# Patient Record
Sex: Female | Born: 1975
Health system: Southern US, Community
[De-identification: ages and names within clinical notes are randomized; demographics above are authoritative.]

## PROBLEM LIST (undated history)

## (undated) DIAGNOSIS — M549 Dorsalgia, unspecified: Secondary | ICD-10-CM

## (undated) DIAGNOSIS — M2550 Pain in unspecified joint: Secondary | ICD-10-CM

## (undated) DIAGNOSIS — K219 Gastro-esophageal reflux disease without esophagitis: Secondary | ICD-10-CM

## (undated) DIAGNOSIS — M1990 Unspecified osteoarthritis, unspecified site: Secondary | ICD-10-CM

## (undated) DIAGNOSIS — M722 Plantar fascial fibromatosis: Secondary | ICD-10-CM

## (undated) DIAGNOSIS — F32A Depression, unspecified: Secondary | ICD-10-CM

## (undated) DIAGNOSIS — T7840XA Allergy, unspecified, initial encounter: Secondary | ICD-10-CM

## (undated) DIAGNOSIS — K8020 Calculus of gallbladder without cholecystitis without obstruction: Secondary | ICD-10-CM

## (undated) DIAGNOSIS — O24419 Gestational diabetes mellitus in pregnancy, unspecified control: Secondary | ICD-10-CM

## (undated) DIAGNOSIS — Z8619 Personal history of other infectious and parasitic diseases: Secondary | ICD-10-CM

## (undated) DIAGNOSIS — E282 Polycystic ovarian syndrome: Secondary | ICD-10-CM

## (undated) DIAGNOSIS — O139 Gestational [pregnancy-induced] hypertension without significant proteinuria, unspecified trimester: Secondary | ICD-10-CM

## (undated) DIAGNOSIS — F419 Anxiety disorder, unspecified: Secondary | ICD-10-CM

## (undated) HISTORY — DX: Pain in unspecified joint: M25.50

## (undated) HISTORY — DX: Allergy, unspecified, initial encounter: T78.40XA

## (undated) HISTORY — DX: Plantar fascial fibromatosis: M72.2

## (undated) HISTORY — DX: Personal history of other infectious and parasitic diseases: Z86.19

## (undated) HISTORY — DX: Gastro-esophageal reflux disease without esophagitis: K21.9

## (undated) HISTORY — DX: Anxiety disorder, unspecified: F41.9

## (undated) HISTORY — PX: WISDOM TOOTH EXTRACTION: SHX21

## (undated) HISTORY — DX: Dorsalgia, unspecified: M54.9

## (undated) HISTORY — DX: Gestational diabetes mellitus in pregnancy, unspecified control: O24.419

## (undated) HISTORY — DX: Unspecified osteoarthritis, unspecified site: M19.90

## (undated) HISTORY — DX: Depression, unspecified: F32.A

## (undated) HISTORY — DX: Polycystic ovarian syndrome: E28.2

---

## 2001-08-27 ENCOUNTER — Other Ambulatory Visit: Admission: RE | Admit: 2001-08-27 | Discharge: 2001-08-27 | Payer: Self-pay | Admitting: Obstetrics and Gynecology

## 2002-08-06 ENCOUNTER — Other Ambulatory Visit: Admission: RE | Admit: 2002-08-06 | Discharge: 2002-08-06 | Payer: Self-pay | Admitting: Obstetrics and Gynecology

## 2003-08-24 ENCOUNTER — Other Ambulatory Visit: Admission: RE | Admit: 2003-08-24 | Discharge: 2003-08-24 | Payer: Self-pay | Admitting: Obstetrics and Gynecology

## 2004-08-29 ENCOUNTER — Other Ambulatory Visit: Admission: RE | Admit: 2004-08-29 | Discharge: 2004-08-29 | Payer: Self-pay | Admitting: Obstetrics and Gynecology

## 2005-09-27 ENCOUNTER — Other Ambulatory Visit: Admission: RE | Admit: 2005-09-27 | Discharge: 2005-09-27 | Payer: Self-pay | Admitting: Obstetrics and Gynecology

## 2006-12-03 ENCOUNTER — Other Ambulatory Visit: Admission: RE | Admit: 2006-12-03 | Discharge: 2006-12-03 | Payer: Self-pay | Admitting: Obstetrics and Gynecology

## 2007-03-09 ENCOUNTER — Ambulatory Visit: Payer: Self-pay | Admitting: Internal Medicine

## 2007-03-10 DIAGNOSIS — J4530 Mild persistent asthma, uncomplicated: Secondary | ICD-10-CM | POA: Insufficient documentation

## 2007-03-10 DIAGNOSIS — Z9189 Other specified personal risk factors, not elsewhere classified: Secondary | ICD-10-CM | POA: Insufficient documentation

## 2007-03-24 ENCOUNTER — Encounter: Payer: Self-pay | Admitting: Internal Medicine

## 2007-03-24 ENCOUNTER — Ambulatory Visit: Payer: Self-pay | Admitting: Internal Medicine

## 2007-03-24 DIAGNOSIS — L2089 Other atopic dermatitis: Secondary | ICD-10-CM

## 2007-04-20 ENCOUNTER — Encounter: Payer: Self-pay | Admitting: Internal Medicine

## 2007-06-15 ENCOUNTER — Encounter: Payer: Self-pay | Admitting: Internal Medicine

## 2007-12-18 ENCOUNTER — Encounter: Payer: Self-pay | Admitting: Internal Medicine

## 2008-01-13 ENCOUNTER — Other Ambulatory Visit: Admission: RE | Admit: 2008-01-13 | Discharge: 2008-01-13 | Payer: Self-pay | Admitting: Obstetrics and Gynecology

## 2008-01-25 ENCOUNTER — Encounter (INDEPENDENT_AMBULATORY_CARE_PROVIDER_SITE_OTHER): Payer: Self-pay | Admitting: *Deleted

## 2008-01-25 ENCOUNTER — Ambulatory Visit: Payer: Self-pay | Admitting: Internal Medicine

## 2008-01-27 LAB — CONVERTED CEMR LAB
Mumps IgG: 3.79 — ABNORMAL HIGH
Rubella: 77.9 intl units/mL — ABNORMAL HIGH
Rubeola IgG: 2.35 — ABNORMAL HIGH

## 2010-10-28 DIAGNOSIS — E282 Polycystic ovarian syndrome: Secondary | ICD-10-CM

## 2010-10-28 HISTORY — DX: Polycystic ovarian syndrome: E28.2

## 2011-12-02 ENCOUNTER — Ambulatory Visit (INDEPENDENT_AMBULATORY_CARE_PROVIDER_SITE_OTHER): Payer: 59 | Admitting: Physician Assistant

## 2011-12-02 VITALS — BP 136/84 | HR 58 | Temp 97.5°F | Resp 14 | Ht 64.0 in | Wt 224.0 lb

## 2011-12-02 DIAGNOSIS — R062 Wheezing: Secondary | ICD-10-CM

## 2011-12-02 DIAGNOSIS — R05 Cough: Secondary | ICD-10-CM

## 2011-12-02 DIAGNOSIS — R059 Cough, unspecified: Secondary | ICD-10-CM

## 2011-12-02 DIAGNOSIS — J310 Chronic rhinitis: Secondary | ICD-10-CM

## 2011-12-02 DIAGNOSIS — R0602 Shortness of breath: Secondary | ICD-10-CM

## 2011-12-02 MED ORDER — BUDESONIDE-FORMOTEROL FUMARATE 160-4.5 MCG/ACT IN AERO: 2.0000 | INHALATION_SPRAY | Freq: Two times a day (BID) | RESPIRATORY_TRACT | Status: DC

## 2011-12-02 MED ORDER — IPRATROPIUM BROMIDE 0.03 % NA SOLN: 2.0000 | Freq: Two times a day (BID) | NASAL | Status: DC

## 2011-12-02 MED ORDER — ALBUTEROL SULFATE HFA 108 (90 BASE) MCG/ACT IN AERS: 2.0000 | INHALATION_SPRAY | RESPIRATORY_TRACT | Status: DC | PRN

## 2011-12-02 NOTE — Progress Notes (Signed)
  Subjective:    Patient ID: Stephanie Long, female    DOB: 1976/08/06, 35 y.o.   MRN: [id number]  HPI Patient presents with several days of mild nasal congestion but wheezing cough and shortness of breath consistent with asthma. She has not used her Symbicort or albuterol rescue inhaler in several months. Her symptoms have been well-controlled until recently. She's been using her albuterol rescue inhaler with temporary relief of her symptoms but has now run out. She has not resumed use of the Symbicort north Zyrtec.  No fevers or chills. No GI or GU symptoms. No myalgias. No ear pressure or fullness. No facial pain or pressure. Cough is nonproductive.   Review of Systems  Constitutional: Negative for chills and fatigue.  HENT: Positive for congestion, rhinorrhea and postnasal drip. Negative for hearing loss, ear pain, sneezing, neck pain, tinnitus and ear discharge.   Eyes: Negative for visual disturbance.  Respiratory: Positive for cough, shortness of breath and wheezing. Negative for chest tightness.   Cardiovascular: Negative for chest pain and palpitations.  Genitourinary: Negative for dysuria, urgency and frequency.  Musculoskeletal: Negative for myalgias, back pain and arthralgias.  Skin: Negative for rash.  Neurological: Negative for dizziness and headaches.  Hematological: Negative for adenopathy.       Objective:   Physical Exam  Vitals reviewed. Constitutional: She is oriented to person, place, and time. Vital signs are normal. She appears well-developed and well-nourished.  HENT:  Head: Normocephalic and atraumatic.  Right Ear: Hearing, tympanic membrane and external ear normal.  Left Ear: Hearing, tympanic membrane, external ear and ear canal normal.  Nose: Nose normal.  Mouth/Throat: Oropharynx is clear and moist and mucous membranes are normal. No oropharyngeal exudate.  Eyes: Pupils are equal, round, and reactive to light.  Neck: Normal range of motion. Neck  supple. No thyromegaly present.  Cardiovascular: Normal rate, regular rhythm, normal heart sounds and intact distal pulses.   Pulmonary/Chest: Effort normal. No accessory muscle usage. Not tachypneic. No respiratory distress. She has wheezes in the right lower field and the left lower field. She has no rhonchi. She has no rales.  Lymphadenopathy:    She has no cervical adenopathy.  Neurological: She is alert and oriented to person, place, and time.  Skin: Skin is warm and dry.          Assessment & Plan:  Asthma exacerbation.  Restart Zyrtec and Symbicort. Continue albuterol for rescue as needed. Add Atrovent nasal spray. If symptoms are not improving in the next 2-3 days return for reevaluation.

## 2011-12-02 NOTE — Patient Instructions (Signed)
Restart the Zyrtec and Symbicort.  Use the albuterol as needed.  When your symptoms improve, you may stop the Zyrtec, then the Symbicort and the ipratropium nasal spray. If your symptoms are not improved in the next 48 hours, or if they worsen, return for re-evaluation.

## 2012-01-08 ENCOUNTER — Ambulatory Visit (INDEPENDENT_AMBULATORY_CARE_PROVIDER_SITE_OTHER): Payer: 59 | Admitting: Internal Medicine

## 2012-01-08 ENCOUNTER — Encounter: Payer: Self-pay | Admitting: Internal Medicine

## 2012-01-08 VITALS — BP 120/72 | HR 60 | Temp 98.4°F | Resp 16 | Ht 64.0 in | Wt 230.0 lb

## 2012-01-08 DIAGNOSIS — J4530 Mild persistent asthma, uncomplicated: Secondary | ICD-10-CM

## 2012-01-08 DIAGNOSIS — R05 Cough: Secondary | ICD-10-CM

## 2012-01-08 DIAGNOSIS — R0602 Shortness of breath: Secondary | ICD-10-CM

## 2012-01-08 DIAGNOSIS — J45909 Unspecified asthma, uncomplicated: Secondary | ICD-10-CM

## 2012-01-08 DIAGNOSIS — R062 Wheezing: Secondary | ICD-10-CM

## 2012-01-08 DIAGNOSIS — R059 Cough, unspecified: Secondary | ICD-10-CM

## 2012-01-08 MED ORDER — BUDESONIDE-FORMOTEROL FUMARATE 160-4.5 MCG/ACT IN AERO: 2.0000 | INHALATION_SPRAY | Freq: Two times a day (BID) | RESPIRATORY_TRACT | Status: DC

## 2012-01-08 MED ORDER — ALBUTEROL SULFATE HFA 108 (90 BASE) MCG/ACT IN AERS: 2.0000 | INHALATION_SPRAY | RESPIRATORY_TRACT | Status: DC | PRN

## 2012-01-08 NOTE — Patient Instructions (Signed)

## 2012-01-08 NOTE — Assessment & Plan Note (Signed)
Continue current regimen

## 2012-01-08 NOTE — Progress Notes (Signed)
  Subjective:    Patient ID: TENNILLE MONTELONGO, female    DOB: Jan 11, 1976, 36 y.o.   MRN: [id number]  Asthma She complains of wheezing. There is no chest tightness, cough, difficulty breathing, frequent throat clearing, hemoptysis, hoarse voice, shortness of breath or sputum production. This is a chronic problem. The current episode started more than 1 year ago. The problem occurs intermittently. The problem has been gradually improving. Pertinent negatives include no appetite change, chest pain, dyspnea on exertion, ear congestion, ear pain, fever, headaches, heartburn, malaise/fatigue, myalgias, nasal congestion, orthopnea, PND, postnasal drip, rhinorrhea, sneezing, sore throat, sweats, trouble swallowing or weight loss. Her symptoms are aggravated by change in weather. Her symptoms are alleviated by steroid inhaler and beta-agonist. She reports significant improvement on treatment. Her past medical history is significant for asthma.      Review of Systems  Constitutional: Negative.  Negative for fever, weight loss, malaise/fatigue and appetite change.  HENT: Negative.  Negative for ear pain, sore throat, hoarse voice, rhinorrhea, sneezing, trouble swallowing and postnasal drip.   Eyes: Negative.   Respiratory: Positive for wheezing. Negative for apnea, cough, hemoptysis, sputum production, choking, chest tightness, shortness of breath and stridor.   Cardiovascular: Negative for chest pain, dyspnea on exertion, palpitations, leg swelling and PND.  Gastrointestinal: Negative.  Negative for heartburn.  Genitourinary: Negative.   Musculoskeletal: Negative.  Negative for myalgias.  Skin: Negative.   Neurological: Negative.  Negative for headaches.  Hematological: Negative.   Psychiatric/Behavioral: Negative.        Objective:   Physical Exam  Vitals reviewed. Constitutional: She is oriented to person, place, and time. She appears well-developed and well-nourished. No distress.  HENT:    Head: Normocephalic and atraumatic.  Mouth/Throat: Oropharynx is clear and moist. No oropharyngeal exudate.  Eyes: Conjunctivae are normal. Right eye exhibits no discharge. Left eye exhibits no discharge. No scleral icterus.  Neck: Normal range of motion. Neck supple. No JVD present. No tracheal deviation present. No thyromegaly present.  Cardiovascular: Normal rate, regular rhythm, normal heart sounds and intact distal pulses.  Exam reveals no gallop and no friction rub.   No murmur heard. Pulmonary/Chest: Effort normal and breath sounds normal. No accessory muscle usage or stridor. Not tachypneic. No respiratory distress. She has no decreased breath sounds. She has no wheezes. She has no rhonchi. She has no rales. She exhibits no tenderness.  Abdominal: Soft. Bowel sounds are normal. She exhibits no distension and no mass. There is no tenderness. There is no rebound and no guarding.  Musculoskeletal: Normal range of motion. She exhibits no edema and no tenderness.  Lymphadenopathy:    She has no cervical adenopathy.  Neurological: She is oriented to person, place, and time.  Skin: Skin is warm and dry. No rash noted. She is not diaphoretic. No erythema. No pallor.  Psychiatric: She has a normal mood and affect. Her behavior is normal. Judgment and thought content normal.          Assessment & Plan:

## 2012-10-06 LAB — OB RESULTS CONSOLE ABO/RH

## 2012-10-06 LAB — OB RESULTS CONSOLE ANTIBODY SCREEN: Antibody Screen: NEGATIVE

## 2013-03-10 ENCOUNTER — Encounter: Payer: 59 | Attending: Obstetrics and Gynecology | Admitting: *Deleted

## 2013-03-10 ENCOUNTER — Encounter: Payer: Self-pay | Admitting: *Deleted

## 2013-03-10 DIAGNOSIS — Z713 Dietary counseling and surveillance: Secondary | ICD-10-CM | POA: Insufficient documentation

## 2013-03-10 DIAGNOSIS — O99810 Abnormal glucose complicating pregnancy: Secondary | ICD-10-CM | POA: Insufficient documentation

## 2013-03-10 NOTE — Patient Instructions (Signed)
Goals:  Check glucose levels per MD as instructed  Follow Gestational Diabetes Diet as instructed  Call for follow-up as needed    

## 2013-03-10 NOTE — Progress Notes (Signed)
  Patient was seen on 03/10/2013 for Gestational Diabetes self-management class at the [facility]. The following learning objectives were met by the patient during this course:   States the definition of Gestational Diabetes  States why dietary management is important in controlling blood glucose  Describes the effects each nutrient has on blood glucose levels  Demonstrates ability to create a balanced meal plan  Demonstrates carbohydrate counting   States when to check blood glucose levels  Demonstrates proper blood glucose monitoring techniques  States the effect of stress and exercise on blood glucose levels  States the importance of limiting caffeine and abstaining from alcohol and smoking  Blood glucose monitor given: Blood glucose monitor given: Contour Next EZ Blood Glucose Kit Lot # [id number] Exp: 09/2013 Blood glucose reading: 122 mg/dl  Patient instructed to monitor glucose levels: FBS: 60 - <90 2 hour: <120  *Patient received handouts:  Nutrition Diabetes and Pregnancy  Carbohydrate Counting List  Patient will be seen for follow-up as needed.

## 2013-04-13 LAB — OB RESULTS CONSOLE GBS: GBS: NEGATIVE

## 2013-05-05 ENCOUNTER — Encounter (HOSPITAL_COMMUNITY): Payer: Self-pay | Admitting: *Deleted

## 2013-05-05 ENCOUNTER — Other Ambulatory Visit: Payer: Self-pay | Admitting: Obstetrics and Gynecology

## 2013-05-05 ENCOUNTER — Telehealth (HOSPITAL_COMMUNITY): Payer: Self-pay | Admitting: *Deleted

## 2013-05-05 NOTE — Telephone Encounter (Signed)
Preadmission screen  

## 2013-05-08 ENCOUNTER — Inpatient Hospital Stay (HOSPITAL_COMMUNITY)
Admission: AD | Admit: 2013-05-08 | Discharge: 2013-05-08 | Disposition: A | Discharge status: Home / Self Care | Payer: 59 | Source: Ambulatory Visit | Attending: Obstetrics and Gynecology | Admitting: Obstetrics and Gynecology

## 2013-05-08 ENCOUNTER — Encounter (HOSPITAL_COMMUNITY): Payer: Self-pay

## 2013-05-08 DIAGNOSIS — O99810 Abnormal glucose complicating pregnancy: Secondary | ICD-10-CM | POA: Insufficient documentation

## 2013-05-08 DIAGNOSIS — O479 False labor, unspecified: Secondary | ICD-10-CM | POA: Insufficient documentation

## 2013-05-08 DIAGNOSIS — R030 Elevated blood-pressure reading, without diagnosis of hypertension: Secondary | ICD-10-CM | POA: Insufficient documentation

## 2013-05-08 DIAGNOSIS — O471 False labor at or after 37 completed weeks of gestation: Secondary | ICD-10-CM | POA: Diagnosis present

## 2013-05-08 DIAGNOSIS — O99891 Other specified diseases and conditions complicating pregnancy: Secondary | ICD-10-CM | POA: Insufficient documentation

## 2013-05-08 LAB — COMPREHENSIVE METABOLIC PANEL
Alkaline Phosphatase: 159 U/L — ABNORMAL HIGH (ref 39–117)
BUN: 10 mg/dL (ref 6–23)
CO2: 21 mEq/L (ref 19–32)
Chloride: 99 mEq/L (ref 96–112)
GFR calc Af Amer: >90 mL/min (ref >90)
Glucose, Bld: 102 mg/dL — ABNORMAL HIGH (ref 70–99)
Potassium: 3.7 mEq/L (ref 3.5–5.1)
Total Bilirubin: 0.3 mg/dL (ref 0.3–1.2)

## 2013-05-08 LAB — URIC ACID: Uric Acid, Serum: 7.1 mg/dL — ABNORMAL HIGH (ref 2.4–7.0)

## 2013-05-08 LAB — URINALYSIS, ROUTINE W REFLEX MICROSCOPIC
Bilirubin Urine: NEGATIVE
Ketones, ur: NEGATIVE mg/dL
Nitrite: NEGATIVE
Urobilinogen, UA: 0.2 mg/dL (ref 0.0–1.0)

## 2013-05-08 LAB — URINE MICROSCOPIC-ADD ON

## 2013-05-08 LAB — CBC
HCT: 36.2 % (ref 36.0–46.0)
Hemoglobin: 12.6 g/dL (ref 12.0–15.0)
WBC: 17 10*3/uL — ABNORMAL HIGH (ref 4.0–10.5)

## 2013-05-08 MED ORDER — HYDROXYZINE HCL 50 MG PO TABS
50.0000 mg | ORAL_TABLET | Freq: Once | ORAL | Status: AC
  Administered 2013-05-08: 50 mg via ORAL
  Filled 2013-05-08: qty 1

## 2013-05-08 MED ORDER — NALBUPHINE HCL 10 MG/ML IJ SOLN
10.0000 mg | Freq: Once | INTRAMUSCULAR | Status: AC
  Administered 2013-05-08: 10 mg via SUBCUTANEOUS
  Filled 2013-05-08: qty 1

## 2013-05-08 NOTE — MAU Note (Signed)
Pt states she has been having uc for the past 36 hours, but have gotten progressively stronger and closer together within the past hour. Denies LOF or vaginal bleeding. States good FM.

## 2013-05-08 NOTE — MAU Provider Note (Signed)
History     CSN: [id number]  Arrival date and time: 05/08/13 1943  Provider here at 1945    Chief Complaint  Patient presents with  . Contractions   HPI  G1P0, 40.2wks presents with contractions every 5-10 mins for 36+ hrs.  No ROM or vaginal bleeding.  (+) FM.    Past Medical History  Diagnosis Date  . Asthma   . Allergy   . GERD (gastroesophageal reflux disease)   . PCOS (polycystic ovarian syndrome) 2012  . Diabetes mellitus without complication   . Gestational diabetes   . Hx of varicella     Past Surgical History  Procedure Laterality Date  . Wisdom tooth extraction    . Wisdom tooth extraction      Family History  Problem Relation Age of Onset  . Diabetes Father   . Heart disease Father   . Hyperlipidemia Father   . Heart attack Father   . Cancer Neg Hx   . Asthma Neg Hx   . Early death Neg Hx   . Depression Mother   . Stroke Maternal Grandmother     History  Substance Use Topics  . Smoking status: Never Smoker   . Smokeless tobacco: Never Used  . Alcohol Use: 1.1 oz/week    1 Glasses of wine, 1 Drinks containing 0.5 oz of alcohol per week    Allergies: No Known Allergies  Prescriptions prior to admission  Medication Sig Dispense Refill  . acetaminophen (TYLENOL) 500 MG tablet Take 500 mg by mouth every 6 (six) hours as needed.      . calcium carbonate (TUMS - DOSED IN MG ELEMENTAL CALCIUM) 500 MG chewable tablet Chew 1 tablet by mouth daily.      . cetirizine (ZYRTEC) 10 MG tablet Take 10 mg by mouth daily.      Marland Kitchen PRENATAL VITAMINS PO Take by mouth.      Marland Kitchen albuterol (PROVENTIL HFA;VENTOLIN HFA) 108 (90 BASE) MCG/ACT inhaler Inhale 2 puffs into the lungs every 4 (four) hours as needed.  1 Inhaler  11  . budesonide-formoterol (SYMBICORT) 160-4.5 MCG/ACT inhaler Inhale 2 puffs into the lungs 2 (two) times daily.  1 Inhaler  11  . clomiPHENE (CLOMID) 50 MG tablet Take 50 mg by mouth daily.      Marland Kitchen ipratropium (ATROVENT) 0.03 % nasal spray Place 2  sprays into the nose 2 (two) times daily.  30 mL  5    Review of Systems  Constitutional: Negative.   HENT: Negative.   Eyes: Negative.  Negative for blurred vision and double vision.  Respiratory: Negative.   Cardiovascular: Negative.   Gastrointestinal: Positive for diarrhea. Negative for abdominal pain.       Diarrhea x1 episode this AM  Genitourinary: Negative for dysuria.  Musculoskeletal: Negative.   Skin: Negative.   Neurological: Negative for dizziness and headaches.  Endo/Heme/Allergies: Negative.   Psychiatric/Behavioral: Negative.   Seen in office yesterday by Dr. Juliene Pina - membranes stripped  Physical Exam  BP: 156/92, 141/88, 160/91, 145/91 Blood pressure 141/88, pulse 76, temperature 97.7 F (36.5 C), temperature source Oral, resp. rate 18, height 5\' 4"  (1.626 m), weight 116.484 kg (256 lb 12.8 oz), last menstrual period 07/24/2012, SpO2 100.00%.  Physical Exam General: A&O x3, no apparent distress, calm, ambulatory, breathing with contractions Abd: gravid, nontender Vaginal Exam: 1/60%; soft/-3 Extremities: mild pedal edema Results for orders placed during the hospital encounter of 05/08/13 (from the past 24 hour(s))  CBC     Status:  Abnormal   Collection Time    05/08/13  8:27 PM      Result Value Range   WBC 17.0 (*) 4.0 - 10.5 K/uL   RBC 4.18  3.87 - 5.11 MIL/uL   Hemoglobin 12.6  12.0 - 15.0 g/dL   HCT 47.8  29.5 - 62.1 %   MCV 86.6  78.0 - 100.0 fL   MCH 30.1  26.0 - 34.0 pg   MCHC 34.8  30.0 - 36.0 g/dL   RDW 30.8  65.7 - 84.6 %   Platelets 212  150 - 400 K/uL  COMPREHENSIVE METABOLIC PANEL     Status: Abnormal   Collection Time    05/08/13  8:27 PM      Result Value Range   Sodium 133 (*) 135 - 145 mEq/L   Potassium 3.7  3.5 - 5.1 mEq/L   Chloride 99  96 - 112 mEq/L   CO2 21  19 - 32 mEq/L   Glucose, Bld 102 (*) 70 - 99 mg/dL   BUN 10  6 - 23 mg/dL   Creatinine, Ser 9.62  0.50 - 1.10 mg/dL   Calcium 9.1  8.4 - 95.2 mg/dL   Total Protein 6.5   6.0 - 8.3 g/dL   Albumin 2.7 (*) 3.5 - 5.2 g/dL   AST 18  0 - 37 U/L   ALT 8  0 - 35 U/L   Alkaline Phosphatase 159 (*) 39 - 117 U/L   Total Bilirubin 0.3  0.3 - 1.2 mg/dL   GFR calc non Af Amer >90  >90 mL/min   GFR calc Af Amer >90  >90 mL/min  URIC ACID     Status: Abnormal   Collection Time    05/08/13  8:27 PM      Result Value Range   Uric Acid, Serum 7.1 (*) 2.4 - 7.0 mg/dL  URINALYSIS, ROUTINE W REFLEX MICROSCOPIC     Status: Abnormal   Collection Time    05/08/13  8:45 PM      Result Value Range   Color, Urine YELLOW  YELLOW   APPearance CLEAR  CLEAR   Specific Gravity, Urine 1.020  1.005 - 1.030   pH 6.0  5.0 - 8.0   Glucose, UA NEGATIVE  NEGATIVE mg/dL   Hgb urine dipstick LARGE (*) NEGATIVE   Bilirubin Urine NEGATIVE  NEGATIVE   Ketones, ur NEGATIVE  NEGATIVE mg/dL   Protein, ur NEGATIVE  NEGATIVE mg/dL   Urobilinogen, UA 0.2  0.0 - 1.0 mg/dL   Nitrite NEGATIVE  NEGATIVE   Leukocytes, UA LARGE (*) NEGATIVE  URINE MICROSCOPIC-ADD ON     Status: Abnormal   Collection Time    05/08/13  8:45 PM      Result Value Range   Squamous Epithelial / LPF MANY (*) RARE   WBC, UA TOO NUMEROUS TO COUNT  <3 WBC/hpf   RBC / HPF 11-20  <3 RBC/hpf   Bacteria, UA FEW (*) RARE   Urine-Other MUCOUS PRESENT    GLUCOSE, CAPILLARY     Status: None   Collection Time    05/08/13  8:59 PM      Result Value Range   Glucose-Capillary 92  70 - 99 mg/dL    MAU Course  Procedures  PIH labs and urine sent   Assessment and Plan  40.2 weeks, IUP with Prodromal Labor            Category I FHR tracing  No cervical progression Elevated Blood Pressure - no PIH symptoms           PIH labs normal except slightly elevated uric acid of 7.1           Negative proteinuria GDM - A1           Normal 2 hr postprandial CBG of 92 mg/dL  Plan: 1) anticipatory guidance for labor 2) therapeutic rest, explained and recommended, questions answered     Nubain 10mg  SQ x 1     Vistaril  50mg  po x 1 3) discharge home - return for increased labor symptoms       Kenard Gower, MSN, CNM 05/08/2013, 8:22 PM

## 2013-05-09 ENCOUNTER — Inpatient Hospital Stay (HOSPITAL_COMMUNITY): Payer: 59 | Admitting: Anesthesiology

## 2013-05-09 ENCOUNTER — Encounter (HOSPITAL_COMMUNITY): Payer: Self-pay | Admitting: *Deleted

## 2013-05-09 ENCOUNTER — Inpatient Hospital Stay (HOSPITAL_COMMUNITY)
Admission: AD | Admit: 2013-05-09 | Discharge: 2013-05-11 | DRG: 765 | Disposition: A | Discharge status: Home / Self Care | Payer: 59 | Source: Ambulatory Visit | Attending: Obstetrics and Gynecology | Admitting: Obstetrics and Gynecology

## 2013-05-09 ENCOUNTER — Encounter (HOSPITAL_COMMUNITY): Payer: Self-pay | Admitting: Anesthesiology

## 2013-05-09 ENCOUNTER — Encounter (HOSPITAL_COMMUNITY)
Admission: AD | Disposition: A | Discharge status: Home / Self Care | Payer: Self-pay | Source: Ambulatory Visit | Attending: Obstetrics and Gynecology

## 2013-05-09 DIAGNOSIS — L2089 Other atopic dermatitis: Secondary | ICD-10-CM

## 2013-05-09 DIAGNOSIS — O99814 Abnormal glucose complicating childbirth: Secondary | ICD-10-CM | POA: Diagnosis present

## 2013-05-09 DIAGNOSIS — O324XX0 Maternal care for high head at term, not applicable or unspecified: Secondary | ICD-10-CM | POA: Diagnosis present

## 2013-05-09 DIAGNOSIS — O471 False labor at or after 37 completed weeks of gestation: Secondary | ICD-10-CM

## 2013-05-09 DIAGNOSIS — O133 Gestational [pregnancy-induced] hypertension without significant proteinuria, third trimester: Secondary | ICD-10-CM

## 2013-05-09 DIAGNOSIS — J4530 Mild persistent asthma, uncomplicated: Secondary | ICD-10-CM

## 2013-05-09 DIAGNOSIS — O3660X0 Maternal care for excessive fetal growth, unspecified trimester, not applicable or unspecified: Secondary | ICD-10-CM | POA: Diagnosis present

## 2013-05-09 DIAGNOSIS — O411090 Infection of amniotic sac and membranes, unspecified, unspecified trimester, not applicable or unspecified: Secondary | ICD-10-CM | POA: Diagnosis present

## 2013-05-09 DIAGNOSIS — O139 Gestational [pregnancy-induced] hypertension without significant proteinuria, unspecified trimester: Secondary | ICD-10-CM | POA: Diagnosis present

## 2013-05-09 DIAGNOSIS — O4290 Premature rupture of membranes, unspecified as to length of time between rupture and onset of labor, unspecified weeks of gestation: Secondary | ICD-10-CM | POA: Diagnosis present

## 2013-05-09 HISTORY — DX: Gestational (pregnancy-induced) hypertension without significant proteinuria, unspecified trimester: O13.9

## 2013-05-09 LAB — TYPE AND SCREEN: ABO/RH(D): A POS

## 2013-05-09 LAB — RPR: RPR Ser Ql: NONREACTIVE

## 2013-05-09 LAB — CBC
Hemoglobin: 12.7 g/dL (ref 12.0–15.0)
RBC: 4.27 MIL/uL (ref 3.87–5.11)

## 2013-05-09 LAB — GLUCOSE, CAPILLARY: Glucose-Capillary: 107 mg/dL — ABNORMAL HIGH (ref 70–99)

## 2013-05-09 SURGERY — Surgical Case
Anesthesia: Epidural | Site: Abdomen | Wound class: Clean Contaminated

## 2013-05-09 MED ORDER — LIDOCAINE-EPINEPHRINE (PF) 2 %-1:200000 IJ SOLN
INTRAMUSCULAR | Status: AC
  Filled 2013-05-09: qty 20

## 2013-05-09 MED ORDER — WITCH HAZEL-GLYCERIN EX PADS: 1.0000 "application " | MEDICATED_PAD | CUTANEOUS | Status: DC | PRN

## 2013-05-09 MED ORDER — LACTATED RINGERS IV SOLN
40.0000 [IU] | INTRAVENOUS | Status: DC | PRN
  Administered 2013-05-09: 40 [IU] via INTRAVENOUS

## 2013-05-09 MED ORDER — MEPERIDINE HCL 25 MG/ML IJ SOLN: 6.2500 mg | INTRAMUSCULAR | Status: DC | PRN

## 2013-05-09 MED ORDER — KETOROLAC TROMETHAMINE 30 MG/ML IJ SOLN: 30.0000 mg | Freq: Four times a day (QID) | INTRAMUSCULAR | Status: AC | PRN

## 2013-05-09 MED ORDER — OXYTOCIN 40 UNITS IN LACTATED RINGERS INFUSION - SIMPLE MED: 62.5000 mL/h | INTRAVENOUS | Status: AC

## 2013-05-09 MED ORDER — OXYTOCIN 40 UNITS IN LACTATED RINGERS INFUSION - SIMPLE MED
1.0000 m[IU]/min | INTRAVENOUS | Status: DC
  Filled 2013-05-09: qty 1000

## 2013-05-09 MED ORDER — OXYTOCIN 10 UNIT/ML IJ SOLN
INTRAMUSCULAR | Status: AC
  Filled 2013-05-09: qty 4

## 2013-05-09 MED ORDER — MORPHINE SULFATE 0.5 MG/ML IJ SOLN
INTRAMUSCULAR | Status: AC
  Filled 2013-05-09: qty 10

## 2013-05-09 MED ORDER — KETOROLAC TROMETHAMINE 60 MG/2ML IM SOLN: 60.0000 mg | Freq: Once | INTRAMUSCULAR | Status: AC | PRN

## 2013-05-09 MED ORDER — ONDANSETRON HCL 4 MG/2ML IJ SOLN
4.0000 mg | INTRAMUSCULAR | Status: DC | PRN
  Administered 2013-05-09: 4 mg via INTRAVENOUS
  Filled 2013-05-09: qty 2

## 2013-05-09 MED ORDER — ACETAMINOPHEN 500 MG PO TABS
1000.0000 mg | ORAL_TABLET | Freq: Once | ORAL | Status: AC
Start: 2013-05-09 — End: 2013-05-09
  Administered 2013-05-09: 1000 mg via ORAL
  Filled 2013-05-09: qty 2

## 2013-05-09 MED ORDER — ALBUTEROL SULFATE HFA 108 (90 BASE) MCG/ACT IN AERS: 2.0000 | INHALATION_SPRAY | Freq: Four times a day (QID) | RESPIRATORY_TRACT | Status: DC | PRN

## 2013-05-09 MED ORDER — HYDROMORPHONE HCL PF 1 MG/ML IJ SOLN
INTRAMUSCULAR | Status: AC
  Filled 2013-05-09: qty 1

## 2013-05-09 MED ORDER — OXYCODONE-ACETAMINOPHEN 5-325 MG PO TABS: 1.0000 | ORAL_TABLET | ORAL | Status: DC | PRN

## 2013-05-09 MED ORDER — DIPHENHYDRAMINE HCL 25 MG PO CAPS: 25.0000 mg | ORAL_CAPSULE | Freq: Four times a day (QID) | ORAL | Status: DC | PRN

## 2013-05-09 MED ORDER — LACTATED RINGERS IV SOLN
500.0000 mL | INTRAVENOUS | Status: DC | PRN
  Administered 2013-05-09: 1000 mL via INTRAVENOUS

## 2013-05-09 MED ORDER — DIPHENHYDRAMINE HCL 25 MG PO CAPS
25.0000 mg | ORAL_CAPSULE | ORAL | Status: DC | PRN
  Filled 2013-05-09: qty 1

## 2013-05-09 MED ORDER — OXYCODONE-ACETAMINOPHEN 5-325 MG PO TABS
1.0000 | ORAL_TABLET | ORAL | Status: DC | PRN
  Administered 2013-05-11 (×2): 1 via ORAL
  Filled 2013-05-09 (×2): qty 1

## 2013-05-09 MED ORDER — FENTANYL 2.5 MCG/ML BUPIVACAINE 1/10 % EPIDURAL INFUSION (WH - ANES)
14.0000 mL/h | INTRAMUSCULAR | Status: DC | PRN
  Administered 2013-05-09: 14 mL/h via EPIDURAL
  Filled 2013-05-09 (×2): qty 125

## 2013-05-09 MED ORDER — DIPHENHYDRAMINE HCL 50 MG/ML IJ SOLN: 25.0000 mg | INTRAMUSCULAR | Status: DC | PRN

## 2013-05-09 MED ORDER — OXYTOCIN 10 UNIT/ML IJ SOLN: 10.0000 [IU] | Freq: Once | INTRAMUSCULAR | Status: DC | PRN

## 2013-05-09 MED ORDER — MEPERIDINE HCL 25 MG/ML IJ SOLN
INTRAMUSCULAR | Status: AC
  Filled 2013-05-09: qty 1

## 2013-05-09 MED ORDER — CITRIC ACID-SODIUM CITRATE 334-500 MG/5ML PO SOLN
30.0000 mL | ORAL | Status: DC | PRN
  Administered 2013-05-09: 30 mL via ORAL
  Filled 2013-05-09: qty 15

## 2013-05-09 MED ORDER — LANOLIN HYDROUS EX OINT: 1.0000 "application " | TOPICAL_OINTMENT | CUTANEOUS | Status: DC | PRN

## 2013-05-09 MED ORDER — SIMETHICONE 80 MG PO CHEW
80.0000 mg | CHEWABLE_TABLET | Freq: Three times a day (TID) | ORAL | Status: DC
  Administered 2013-05-10 – 2013-05-11 (×5): 80 mg via ORAL

## 2013-05-09 MED ORDER — FENTANYL CITRATE 0.05 MG/ML IJ SOLN: 25.0000 ug | INTRAMUSCULAR | Status: DC | PRN

## 2013-05-09 MED ORDER — NALBUPHINE HCL 10 MG/ML IJ SOLN: 5.0000 mg | INTRAMUSCULAR | Status: DC | PRN

## 2013-05-09 MED ORDER — NALOXONE HCL 0.4 MG/ML IJ SOLN: 0.4000 mg | INTRAMUSCULAR | Status: DC | PRN

## 2013-05-09 MED ORDER — SIMETHICONE 80 MG PO CHEW: 80.0000 mg | CHEWABLE_TABLET | ORAL | Status: DC | PRN

## 2013-05-09 MED ORDER — OXYTOCIN 40 UNITS IN LACTATED RINGERS INFUSION - SIMPLE MED
1.0000 m[IU]/min | INTRAVENOUS | Status: DC
  Administered 2013-05-09: 2 m[IU]/min via INTRAVENOUS

## 2013-05-09 MED ORDER — DIPHENHYDRAMINE HCL 50 MG/ML IJ SOLN: 12.5000 mg | INTRAMUSCULAR | Status: DC | PRN

## 2013-05-09 MED ORDER — IBUPROFEN 600 MG PO TABS
600.0000 mg | ORAL_TABLET | Freq: Four times a day (QID) | ORAL | Status: DC
  Administered 2013-05-10 – 2013-05-11 (×5): 600 mg via ORAL
  Filled 2013-05-09 (×6): qty 1

## 2013-05-09 MED ORDER — FENTANYL 2.5 MCG/ML BUPIVACAINE 1/10 % EPIDURAL INFUSION (WH - ANES)
INTRAMUSCULAR | Status: DC | PRN
  Administered 2013-05-09: 14 mL/h via EPIDURAL

## 2013-05-09 MED ORDER — ACETAMINOPHEN 325 MG PO TABS: 650.0000 mg | ORAL_TABLET | ORAL | Status: DC | PRN

## 2013-05-09 MED ORDER — TERBUTALINE SULFATE 1 MG/ML IJ SOLN: 0.2500 mg | Freq: Once | INTRAMUSCULAR | Status: DC | PRN

## 2013-05-09 MED ORDER — DEXTROSE 5 % IV SOLN
2.0000 g | INTRAVENOUS | Status: AC
  Administered 2013-05-09: 2 g via INTRAVENOUS
  Filled 2013-05-09: qty 2

## 2013-05-09 MED ORDER — PRENATAL MULTIVITAMIN CH
1.0000 | ORAL_TABLET | Freq: Every day | ORAL | Status: DC
  Administered 2013-05-10: 1 via ORAL
  Filled 2013-05-09: qty 1

## 2013-05-09 MED ORDER — EPHEDRINE 5 MG/ML INJ
10.0000 mg | INTRAVENOUS | Status: DC | PRN
  Filled 2013-05-09: qty 4

## 2013-05-09 MED ORDER — SODIUM CHLORIDE 0.9 % IJ SOLN: 3.0000 mL | INTRAMUSCULAR | Status: DC | PRN

## 2013-05-09 MED ORDER — BUPIVACAINE HCL (PF) 0.25 % IJ SOLN
INTRAMUSCULAR | Status: DC | PRN
  Administered 2013-05-09: 10 mL

## 2013-05-09 MED ORDER — ZOLPIDEM TARTRATE 5 MG PO TABS: 5.0000 mg | ORAL_TABLET | Freq: Every evening | ORAL | Status: DC | PRN

## 2013-05-09 MED ORDER — LACTATED RINGERS IV SOLN
500.0000 mL | Freq: Once | INTRAVENOUS | Status: AC
  Administered 2013-05-09: 500 mL via INTRAVENOUS

## 2013-05-09 MED ORDER — FENTANYL CITRATE 0.05 MG/ML IJ SOLN
100.0000 ug | INTRAMUSCULAR | Status: DC | PRN
  Administered 2013-05-09 (×2): 100 ug via INTRAVENOUS
  Filled 2013-05-09 (×2): qty 2

## 2013-05-09 MED ORDER — DIBUCAINE 1 % RE OINT: 1.0000 "application " | TOPICAL_OINTMENT | RECTAL | Status: DC | PRN

## 2013-05-09 MED ORDER — KETOROLAC TROMETHAMINE 60 MG/2ML IM SOLN
INTRAMUSCULAR | Status: AC
  Administered 2013-05-09: 60 mg via INTRAMUSCULAR
  Filled 2013-05-09: qty 2

## 2013-05-09 MED ORDER — BUPIVACAINE HCL (PF) 0.25 % IJ SOLN
INTRAMUSCULAR | Status: AC
  Filled 2013-05-09: qty 30

## 2013-05-09 MED ORDER — PHENYLEPHRINE 40 MCG/ML (10ML) SYRINGE FOR IV PUSH (FOR BLOOD PRESSURE SUPPORT): 80.0000 ug | PREFILLED_SYRINGE | INTRAVENOUS | Status: DC | PRN

## 2013-05-09 MED ORDER — SCOPOLAMINE 1 MG/3DAYS TD PT72: 1.0000 | MEDICATED_PATCH | Freq: Once | TRANSDERMAL | Status: DC

## 2013-05-09 MED ORDER — ONDANSETRON HCL 4 MG/2ML IJ SOLN: 4.0000 mg | Freq: Three times a day (TID) | INTRAMUSCULAR | Status: DC | PRN

## 2013-05-09 MED ORDER — LIDOCAINE HCL (PF) 1 % IJ SOLN
INTRAMUSCULAR | Status: DC | PRN
  Administered 2013-05-09: 3 mL
  Administered 2013-05-09 (×2): 5 mL

## 2013-05-09 MED ORDER — METOCLOPRAMIDE HCL 5 MG/ML IJ SOLN: 10.0000 mg | Freq: Three times a day (TID) | INTRAMUSCULAR | Status: DC | PRN

## 2013-05-09 MED ORDER — METOCLOPRAMIDE HCL 5 MG/ML IJ SOLN
INTRAMUSCULAR | Status: AC
  Filled 2013-05-09: qty 2

## 2013-05-09 MED ORDER — ONDANSETRON HCL 4 MG/2ML IJ SOLN
INTRAMUSCULAR | Status: DC | PRN
  Administered 2013-05-09: 4 mg via INTRAVENOUS

## 2013-05-09 MED ORDER — MORPHINE SULFATE (PF) 0.5 MG/ML IJ SOLN
INTRAMUSCULAR | Status: DC | PRN
  Administered 2013-05-09: 1 mg via INTRAVENOUS
  Administered 2013-05-09: 4 mg via EPIDURAL

## 2013-05-09 MED ORDER — SCOPOLAMINE 1 MG/3DAYS TD PT72
MEDICATED_PATCH | TRANSDERMAL | Status: AC
  Administered 2013-05-09: 1.5 mg via TRANSDERMAL
  Filled 2013-05-09: qty 1

## 2013-05-09 MED ORDER — ONDANSETRON HCL 4 MG/2ML IJ SOLN
INTRAMUSCULAR | Status: AC
  Filled 2013-05-09: qty 2

## 2013-05-09 MED ORDER — TETANUS-DIPHTH-ACELL PERTUSSIS 5-2.5-18.5 LF-MCG/0.5 IM SUSP: 0.5000 mL | Freq: Once | INTRAMUSCULAR | Status: DC

## 2013-05-09 MED ORDER — LACTATED RINGERS IV SOLN: INTRAVENOUS | Status: DC

## 2013-05-09 MED ORDER — EPHEDRINE 5 MG/ML INJ: 10.0000 mg | INTRAVENOUS | Status: DC | PRN

## 2013-05-09 MED ORDER — MENTHOL 3 MG MT LOZG: 1.0000 | LOZENGE | OROMUCOSAL | Status: DC | PRN

## 2013-05-09 MED ORDER — LACTATED RINGERS IV SOLN
INTRAVENOUS | Status: DC
  Administered 2013-05-10 (×2): via INTRAVENOUS

## 2013-05-09 MED ORDER — DIPHENHYDRAMINE HCL 50 MG/ML IJ SOLN
12.5000 mg | INTRAMUSCULAR | Status: DC | PRN
  Administered 2013-05-09: 12.5 mg via INTRAVENOUS
  Filled 2013-05-09: qty 1

## 2013-05-09 MED ORDER — LACTATED RINGERS IV SOLN
INTRAVENOUS | Status: DC | PRN
  Administered 2013-05-09: 18:00:00 via INTRAVENOUS

## 2013-05-09 MED ORDER — LACTATED RINGERS IV SOLN
INTRAVENOUS | Status: DC
  Administered 2013-05-09 (×2): via INTRAVENOUS

## 2013-05-09 MED ORDER — MEPERIDINE HCL 25 MG/ML IJ SOLN
INTRAMUSCULAR | Status: DC | PRN
  Administered 2013-05-09 (×2): 12.5 mg via INTRAVENOUS

## 2013-05-09 MED ORDER — ONDANSETRON HCL 4 MG PO TABS: 4.0000 mg | ORAL_TABLET | ORAL | Status: DC | PRN

## 2013-05-09 MED ORDER — SODIUM BICARBONATE 8.4 % IV SOLN
INTRAVENOUS | Status: DC | PRN
  Administered 2013-05-09: 5 mL via EPIDURAL

## 2013-05-09 MED ORDER — SODIUM CHLORIDE 0.9 % IR SOLN
Status: DC | PRN
  Administered 2013-05-09: 1000 mL

## 2013-05-09 MED ORDER — SENNOSIDES-DOCUSATE SODIUM 8.6-50 MG PO TABS
2.0000 | ORAL_TABLET | Freq: Every day | ORAL | Status: DC
  Administered 2013-05-10: 2 via ORAL

## 2013-05-09 MED ORDER — PHENYLEPHRINE 40 MCG/ML (10ML) SYRINGE FOR IV PUSH (FOR BLOOD PRESSURE SUPPORT)
80.0000 ug | PREFILLED_SYRINGE | INTRAVENOUS | Status: DC | PRN
  Filled 2013-05-09: qty 5

## 2013-05-09 MED ORDER — IBUPROFEN 600 MG PO TABS: 600.0000 mg | ORAL_TABLET | Freq: Four times a day (QID) | ORAL | Status: DC | PRN

## 2013-05-09 MED ORDER — METOCLOPRAMIDE HCL 5 MG/ML IJ SOLN
INTRAMUSCULAR | Status: DC | PRN
  Administered 2013-05-09: 10 mg via INTRAVENOUS

## 2013-05-09 MED ORDER — NALOXONE HCL 1 MG/ML IJ SOLN: 1.0000 ug/kg/h | INTRAVENOUS | Status: DC | PRN

## 2013-05-09 MED ORDER — LIDOCAINE HCL (PF) 1 % IJ SOLN: 30.0000 mL | INTRAMUSCULAR | Status: DC | PRN

## 2013-05-09 MED ORDER — SODIUM BICARBONATE 8.4 % IV SOLN
INTRAVENOUS | Status: AC
  Filled 2013-05-09: qty 50

## 2013-05-09 SURGICAL SUPPLY — 29 items
CLAMP CORD UMBIL (MISCELLANEOUS) IMPLANT
CLOTH BEACON ORANGE TIMEOUT ST (SAFETY) ×2 IMPLANT
CONTAINER PREFILL 10% NBF 15ML (MISCELLANEOUS) IMPLANT
DRAPE LG THREE QUARTER DISP (DRAPES) ×2 IMPLANT
DRSG OPSITE 11X17.75 LRG (GAUZE/BANDAGES/DRESSINGS) ×2 IMPLANT
DRSG OPSITE POSTOP 4X10 (GAUZE/BANDAGES/DRESSINGS) ×2 IMPLANT
DURAPREP 26ML APPLICATOR (WOUND CARE) ×2 IMPLANT
ELECT REM PT RETURN 9FT ADLT (ELECTROSURGICAL) ×2
ELECTRODE REM PT RTRN 9FT ADLT (ELECTROSURGICAL) ×1 IMPLANT
EXTRACTOR VACUUM M CUP 4 TUBE (SUCTIONS) IMPLANT
GLOVE BIO SURGEON STRL SZ7.5 (GLOVE) ×2 IMPLANT
GOWN PREVENTION PLUS XLARGE (GOWN DISPOSABLE) ×2 IMPLANT
GOWN STRL REIN XL XLG (GOWN DISPOSABLE) ×6 IMPLANT
KIT ABG SYR 3ML LUER SLIP (SYRINGE) IMPLANT
NEEDLE HYPO 25X1 1.5 SAFETY (NEEDLE) ×2 IMPLANT
NEEDLE HYPO 25X5/8 SAFETYGLIDE (NEEDLE) IMPLANT
NS IRRIG 1000ML POUR BTL (IV SOLUTION) ×2 IMPLANT
PACK C SECTION WH (CUSTOM PROCEDURE TRAY) ×2 IMPLANT
STAPLER VISISTAT 35W (STAPLE) ×2 IMPLANT
SUT MNCRL 0 VIOLET CTX 36 (SUTURE) ×2 IMPLANT
SUT MON AB 2-0 CT1 27 (SUTURE) ×2 IMPLANT
SUT MON AB-0 CT1 36 (SUTURE) ×4 IMPLANT
SUT MONOCRYL 0 CTX 36 (SUTURE) ×2
SUT PLAIN 0 NONE (SUTURE) IMPLANT
SUT PLAIN 2 0 XLH (SUTURE) IMPLANT
SYR CONTROL 10ML LL (SYRINGE) ×2 IMPLANT
TOWEL OR 17X24 6PK STRL BLUE (TOWEL DISPOSABLE) ×6 IMPLANT
TRAY FOLEY CATH 14FR (SET/KITS/TRAYS/PACK) IMPLANT
WATER STERILE IRR 1000ML POUR (IV SOLUTION) ×2 IMPLANT

## 2013-05-09 NOTE — Progress Notes (Signed)
Stephanie Long CNM notified of pt's admission and staus. Will see pt

## 2013-05-09 NOTE — OR Nursing (Signed)
Uterus massaged by Quillian Quince ST.  100 cc evacuated from uterus during uterine massage. Two tubes of cord blood sent to lab.

## 2013-05-09 NOTE — Op Note (Signed)
Cesarean Section Procedure Note  Indications: chorioamnionitis and failure to progress: arrest of dilation  Pre-operative Diagnosis: 40 week 3 day pregnancy.  Post-operative Diagnosis: same  Surgeon: Lenoard Aden   Assistants: Megan Salon, CNM  Anesthesia: Local anesthesia 0.25.% bupivacaine and Spinal anesthesia  ASA Class: 2  Procedure Details  The patient was seen in the Holding Room. The risks, benefits, complications, treatment options, and expected outcomes were discussed with the patient.  The patient concurred with the proposed plan, giving informed consent. The risks of anesthesia, infection, bleeding and possible injury to other organs discussed. Injury to bowel, bladder, or ureter with possible need for repair discussed. Possible need for transfusion with secondary risks of hepatitis or HIV acquisition discussed. Post operative complications to include but not limited to DVT, PE and Pneumonia noted. The site of surgery properly noted/marked. The patient was taken to Operating Room # 9, identified as Stephanie Long and the procedure verified as C-Section Delivery. A Time Out was held and the above information confirmed.  After induction of anesthesia, the patient was draped and prepped in the usual sterile manner. A Pfannenstiel incision was made and carried down through the subcutaneous tissue to the fascia. Fascial incision was made and extended transversely using Mayo scissors. The fascia was separated from the underlying rectus tissue superiorly and inferiorly. The peritoneum was identified and entered. Peritoneal incision was extended longitudinally. The utero-vesical peritoneal reflection was incised transversely and the bladder flap was bluntly freed from the lower uterine segment. A low transverse uterine incision(Kerr hysterotomy) was made. Delivered from OP presentation was a  female with Apgar scores of 8 at one minute and 9 at five minutes. Bulb suctioning gently  performed. Neonatal team in attendance.After the umbilical cord was clamped and cut cord blood was obtained for evaluation. The placenta was removed intact and appeared normal. The uterus was curetted with a dry lap pack. Good hemostasis was noted.The uterine outline, tubes and ovaries appeared normal. The uterine incision was closed with running locked sutures of 0 Monocryl x 2 layers. Hemostasis was observed.  The fascia was then reapproximated with running sutures of 0 Monocryl. 2-0 plain suture to close [place] layer.The skin was reapproximated with staples.  Instrument, sponge, and needle counts were correct prior the abdominal closure and at the conclusion of the case.   Findings: OP presentation  Estimated Blood Loss:  300 mL         Drains: foley                 Specimens: placenta to path                 Complications:  None; patient tolerated the procedure well.         Disposition: PACU - hemodynamically stable.         Condition: stable  Attending Attestation: I performed the procedure.

## 2013-05-09 NOTE — Progress Notes (Signed)
S: Doing well, not tolerating pain well with Fentanyl.  Desires epidural.  Pelvic pressure present.  OCeasar Mons Vitals:   05/09/13 0432 05/09/13 0446 05/09/13 0533 05/09/13 0730  BP: 151/97 144/84 155/95 155/94  Pulse: 79 85 81 77  Temp:    98 F (36.7 C)  TempSrc:   Axillary Oral  Resp:   20 20  Height:   5\' 4"  (1.626 m)   Weight:   117.028 kg (258 lb)      FHT:  FHR: 140 bpm, variability: moderate,  accelerations:  Present,  decelerations:  Absent UC:   irregular, every 3-5 minutes SVE:   Dilation: 3.0 Effacement (%): 80 Station: -2 Exam by:: Arita Miss, CNM   A / P: PROM with latent labor  Fetal Wellbeing:  Category I Pain Control:  Fentanyl   Epidural ordered Anticipated MOD:  NSVD  Kenard Gower, MSN, CNM 05/09/2013, 8:02 AM

## 2013-05-09 NOTE — Progress Notes (Signed)
Report called to Dana RN in BS. Pt to BS via w/c 

## 2013-05-09 NOTE — Progress Notes (Signed)
S: Doing well, pain minimally controlled with Nubain.  Epidural requested. Increasing pelvic pressure and low back pain.   O: Filed Vitals:   05/09/13 1211 05/09/13 1217 05/09/13 1219 05/09/13 1231  BP: 132/81 117/96  113/51  Pulse: 89 97  91  Temp:   98.9 F (37.2 C)   TempSrc:   Axillary   Resp:    18  Height:      Weight:      SpO2:         FHT:  FHR: 115 bpm, variability: moderate,  accelerations:  Present,  decelerations:  Absent UC:   regular, every 2-5 minutes SVE:   Dilation: 6.5 Effacement (%): 90 Station: -1 Exam by:: Arita Miss CNM   A / P: Augmentation of labor, progressing well  Fetal Wellbeing:  Category I Pain Control:  Nubain 10mg   Anticipated MOD:  NSVD  Kenard Gower, MSN, CNM 05/09/2013, 12:38 PM

## 2013-05-09 NOTE — Transfer of Care (Signed)
Immediate Anesthesia Transfer of Care Note  Patient: Stephanie Long  Procedure(s) Performed: Procedure(s): primary cesarean section with delivery of baby girl at 1800.  Apgars 9/9. (N/A)  Patient Location: PACU  Anesthesia Type:Epidural  Level of Consciousness: awake, alert  and oriented  Airway & Oxygen Therapy: Patient Spontanous Breathing  Post-op Assessment: Report given to PACU RN and Post -op Vital signs reviewed and stable  Post vital signs: Reviewed and stable  Complications: No apparent anesthesia complications

## 2013-05-09 NOTE — Progress Notes (Signed)
[facility] d/c'd WNL.  Pt moved to stretcher and taken to OR 9

## 2013-05-09 NOTE — Anesthesia Preprocedure Evaluation (Signed)
Anesthesia Evaluation  Patient identified by MRN, date of birth, ID band Patient awake    Reviewed: Allergy & Precautions, H&P , NPO status , Patient's Chart, lab work & pertinent test results  History of Anesthesia Complications Negative for: history of anesthetic complications  Airway Mallampati: II TM Distance: >3 FB Neck ROM: full    Dental no notable dental hx. (+) Teeth Intact   Pulmonary neg pulmonary ROS, asthma ,  breath sounds clear to auscultation  Pulmonary exam normal       Cardiovascular negative cardio ROS  Rhythm:regular Rate:Normal     Neuro/Psych negative neurological ROS  negative psych ROS   GI/Hepatic negative GI ROS, Neg liver ROS,   Endo/Other  negative endocrine ROSGestationalMorbid obesity  Renal/GU negative Renal ROS  negative genitourinary   Musculoskeletal   Abdominal Normal abdominal exam  (+)   Peds  Hematology negative hematology ROS (+)   Anesthesia Other Findings   Reproductive/Obstetrics (+) Pregnancy                           Anesthesia Physical Anesthesia Plan  ASA: III  Anesthesia Plan: Epidural   Post-op Pain Management:    Induction:   Airway Management Planned:   Additional Equipment:   Intra-op Plan:   Post-operative Plan:   Informed Consent: I have reviewed the patients History and Physical, chart, labs and discussed the procedure including the risks, benefits and alternatives for the proposed anesthesia with the patient or authorized representative who has indicated his/her understanding and acceptance.     Plan Discussed with:   Anesthesia Plan Comments:         Anesthesia Quick Evaluation

## 2013-05-09 NOTE — Progress Notes (Signed)
Stephanie Long CNM

## 2013-05-09 NOTE — Anesthesia Procedure Notes (Signed)
Epidural Patient location during procedure: OB  Staffing Anesthesiologist: Phillips Grout Performed by: anesthesiologist   Preanesthetic Checklist Completed: patient identified, site marked, surgical consent, pre-op evaluation, timeout performed, IV checked, risks and benefits discussed and monitors and equipment checked  Epidural Patient position: sitting Prep: ChloraPrep Patient monitoring: heart rate, continuous pulse ox and blood pressure Approach: midline Injection technique: LOR saline  Needle:  Needle type: Tuohy  Needle gauge: 17 G Needle length: 9 cm and 9 Needle insertion depth: 7 cm Catheter type: closed end flexible Catheter size: 20 Guage Catheter at skin depth: 13 cm Test dose: negative  Assessment Events: blood not aspirated, injection not painful, no injection resistance, negative IV test and no paresthesia  Additional Notes   Patient tolerated the insertion well without complications.

## 2013-05-09 NOTE — Progress Notes (Signed)
To room 164 via wc

## 2013-05-09 NOTE — H&P (Signed)
OB ADMISSION/ HISTORY & PHYSICAL:  Admission Date: 05/09/2013  3:42 AM  Admit Diagnosis: Onset of Labor with PROM  Stephanie Long is a 37 y.o. female presenting for PROM and contractions every 5 minutes.  Prenatal History: G1P0   EDC : 05/06/2013, Alternate EDD Entry  Prenatal care at [facility] since 9.[redacted] weeks gestation  Prenatal course complicated by GDM- A, LGA baby  Prenatal Labs: ABO, Rh: A (12/10 0000)  Antibody: Negative (12/10 0000) Rubella:   Immune RPR: Nonreactive (12/10 0000)  HBsAg: Negative (12/10 0000)  HIV: Non-reactive (12/10 0000)  GBS: Negative (06/17 0000)  1 hr Glucola : 127 (10/2012), 124 (01/2013), 188 (02/2013)   Medical / Surgical History :  Past medical history:  Past Medical History  Diagnosis Date  . Asthma   . Allergy   . GERD (gastroesophageal reflux disease)   . PCOS (polycystic ovarian syndrome) 2012  . Diabetes mellitus without complication   . Gestational diabetes   . Hx of varicella      Past surgical history:  Past Surgical History  Procedure Laterality Date  . Wisdom tooth extraction    . Wisdom tooth extraction       Family History:  Family History  Problem Relation Age of Onset  . Diabetes Father   . Heart disease Father   . Hyperlipidemia Father   . Heart attack Father   . Cancer Neg Hx   . Asthma Neg Hx   . Early death Neg Hx   . Depression Mother   . Stroke Maternal Grandmother      Social History:  reports that she has never smoked. She has never used smokeless tobacco. She reports that she drinks about 1.1 ounces of alcohol per week. She reports that she does not use illicit drugs.   Allergies: Review of patient's allergies indicates no known allergies.    Current Medications at time of admission:  Prescriptions prior to admission  Medication Sig Dispense Refill  . acetaminophen (TYLENOL) 500 MG tablet Take 500 mg by mouth every 6 (six) hours as needed.      Marland Kitchen albuterol (PROVENTIL  HFA;VENTOLIN HFA) 108 (90 BASE) MCG/ACT inhaler Inhale 2 puffs into the lungs every 4 (four) hours as needed.  1 Inhaler  11  . calcium carbonate (TUMS - DOSED IN MG ELEMENTAL CALCIUM) 500 MG chewable tablet Chew 1 tablet by mouth daily.      . cetirizine (ZYRTEC) 10 MG tablet Take 10 mg by mouth daily.      . diphenhydrAMINE (BENADRYL) 12.5 MG/5ML liquid Take 25 mg by mouth at bedtime as needed for sleep.      . Prenatal Vit-Fe Fumarate-FA (PRENATAL MULTIVITAMIN) TABS Take 1 tablet by mouth daily at 12 noon.      . ranitidine (ZANTAC) 150 MG tablet Take 150 mg by mouth daily as needed for heartburn.          Review of Systems: Constitutional: Negative.  HENT: Negative.  Eyes: Negative. Negative for blurred vision and double vision.  Respiratory: Negative.  Cardiovascular: Negative.  Gastrointestinal: Negative for diarrhea. Negative for abdominal pain.  Diarrhea x1 episode yesterday AM  Genitourinary: Negative for dysuria.  Musculoskeletal: Negative.  Skin: Negative.  Neurological: Negative for dizziness and headaches.  Endo/Heme/Allergies: Negative.  Psychiatric/Behavioral: Negative.    Physical Exam: Blood pressure 165/90, pulse 82, temperature 97.5 F (36.4 C), resp. rate 22, last menstrual period 07/24/2012.  General: A&O x3, no apparent distress, calm, ambulatory, breathing with contractions  Heart: Norma rate and rhythm Lungs: clear in lobes bilaterally Abdomen: gravid, non-tender Extremities: mild pedal edema Genitalia / VE:  1.5 / 80%; soft / -3 / vtx - no blood on exam glove  Membranes: ruptured, small amount of yellow fluid FHR: 140 baseline TOCO: mild contractions every 5 minutes  Labs:     Recent Labs  05/08/13 2027  WBC 17.0*  HGB 12.6  HCT 36.2  PLT 212     Assessment:  37 y.o. G1P0 at [redacted]w[redacted]d SIUP  1. PROM with no active labor 2. GDM - A1 3. Fetal Wellbeing: Category 1  4. GBS: Negative   Plan:  1. Admit to BS 2. Routine L&D orders 3. Pain  Control: Fentanyl 100 mcg every 1 hour prn x 3 doses    Dr Billy Coast notified of admission / plan of care    Kenard Gower, MSN, CNM 05/09/2013, 5:29 AM

## 2013-05-09 NOTE — Progress Notes (Signed)
S: Doing well, pain well-controlled with epidural.   O: Filed Vitals:   05/09/13 0834 05/09/13 0836 05/09/13 0840 05/09/13 0841  BP: 146/79 151/84 120/61 125/87  Pulse: 90 98 112 107  Temp:      TempSrc:      Resp:      Height:      Weight:      SpO2:  100%  93%     FHT:  FHR: 150 bpm, variability: minimal ,  accelerations:  Abscent,  decelerations:  Present late decels with contractions UC:   irregular, every 2-5 minutes SVE:   Dilation: 3 Effacement (%): 80 Station: -3 Exam by:: Arita Miss CNM   A / P: PROM  Latent Labor  Fetal Wellbeing:  Category III Pain Control:  Epidural IUPC placed O2 placed Recheck BP Reposition  Anticipated MOD:  NSVD  Kenard Gower, MSN, CNM 05/09/2013, 9:20 AM

## 2013-05-09 NOTE — Progress Notes (Signed)
S: Doing well, pain well-controlled with Epidural.  O: Filed Vitals:   05/09/13 1346 05/09/13 1400 05/09/13 1401 05/09/13 1416  BP: 125/60  122/62 128/69  Pulse: 69  75 78  Temp:      TempSrc:      Resp:  20    Height:      Weight:      SpO2:         FHT:  FHR: 140 bpm, Intermittent category 1 to category 2 FHR tracing prior to exam with intermittent variability, accelerations, and Mixed variable decelerations UC:   regular, every 2-7 minutes with episodes of coupling and tripling\ MVU: average 200-250 Pitocin: 6 mU SVE:   Dilation: 5 Effacement (%): 80 Station: -2 Exam by:: Arita Miss RN    A / P: Protracted active phase despite pitocin augmentation  Fetal Wellbeing:  Category I and Category II Pain Control:  Epidural  Re-Evaluate for cervical progression 1-3 hours Dr. Billy Coast updated on status If cervical progression or development of category 3 tracing, will precede to cesarean delivery    Arita Miss, Stephanie Long, M, MSN, CNM 05/09/2013, 2:46 PM

## 2013-05-09 NOTE — Anesthesia Postprocedure Evaluation (Signed)
  Anesthesia Post-op Note  Patient: Stephanie Long  Procedure(s) Performed: Procedure(s) (LRB): primary cesarean section with delivery of baby girl at 1800.  Apgars 9/9. (N/A)  Patient Location: PACU  Anesthesia Type: Epidural  Level of Consciousness: awake and alert   Airway and Oxygen Therapy: Patient Spontanous Breathing  Post-op Pain: mild  Post-op Assessment: Post-op Vital signs reviewed, Patient's Cardiovascular Status Stable, Respiratory Function Stable, Patent Airway and No signs of Nausea or vomiting  Last Vitals:  Filed Vitals:   05/09/13 2015  BP: 128/72  Pulse: 100  Temp: 37.6 C  Resp: 19    Post-op Vital Signs: stable   Complications: No apparent anesthesia complications

## 2013-05-09 NOTE — MAU Note (Signed)
Gush of cloudy fld at 0320. Continue to leak flds

## 2013-05-09 NOTE — Progress Notes (Signed)
Patient ID: Stephanie Long, female   DOB: 03-28-1976, 37 y.o.   MRN: [id number] Discussed options with patient. Minimal cervical change since 1230. History of Gestational HTN. BP stable. No s/s PEC. FHR now tachycardic with minimal BTBV. No recurrent decels.  BP 129/78  Pulse 134  Temp(Src) 100.8 F (38.2 C) (Axillary)  Resp 18  Ht 5\' 4"  (1.626 m)  Wt 117.028 kg (258 lb)  BMI 44.26 kg/m2  SpO2 100%  LMP 07/24/2012  Labor adequate by IUPC  Imp: Active phase arrest Maternal temperature- likely Chorio  Plan: Proceed with csection Risks vs benefits discussed. Csection procedure reviewed. OR notified.

## 2013-05-09 NOTE — MAU Provider Note (Signed)
History     CSN: [id number]  Arrival date and time: 05/09/13 0342   Provider notified at 0420. Provider arrival at 0430.    Chief Complaint  Patient presents with  . Contractions  . Rupture of Membranes   HPI  Ms. Holohan is 37 year old female G1P0 @ 40.[redacted] weeks gestation presents with PROM since 0320.  She was seen earlier in MAU and discharged home in prodromal labor. She was given therapeutic rest using Nubain and Vistaril.  She reports getting some rest.  She states her contractions are about every 5 minutes.  No vaginal bleeding.  (+) FM since PROM.  Past Medical History  Diagnosis Date  . Asthma   . Allergy   . GERD (gastroesophageal reflux disease)   . PCOS (polycystic ovarian syndrome) 2012  . Diabetes mellitus without complication   . Gestational diabetes   . Hx of varicella     Past Surgical History  Procedure Laterality Date  . Wisdom tooth extraction    . Wisdom tooth extraction      Family History  Problem Relation Age of Onset  . Diabetes Father   . Heart disease Father   . Hyperlipidemia Father   . Heart attack Father   . Cancer Neg Hx   . Asthma Neg Hx   . Early death Neg Hx   . Depression Mother   . Stroke Maternal Grandmother     History  Substance Use Topics  . Smoking status: Never Smoker   . Smokeless tobacco: Never Used  . Alcohol Use: 1.1 oz/week    1 Glasses of wine, 1 Drinks containing 0.5 oz of alcohol per week    Allergies: No Known Allergies  Prescriptions prior to admission  Medication Sig Dispense Refill  . acetaminophen (TYLENOL) 500 MG tablet Take 500 mg by mouth every 6 (six) hours as needed.      Marland Kitchen albuterol (PROVENTIL HFA;VENTOLIN HFA) 108 (90 BASE) MCG/ACT inhaler Inhale 2 puffs into the lungs every 4 (four) hours as needed.  1 Inhaler  11  . calcium carbonate (TUMS - DOSED IN MG ELEMENTAL CALCIUM) 500 MG chewable tablet Chew 1 tablet by mouth daily.      . cetirizine (ZYRTEC) 10 MG tablet Take 10 mg by mouth  daily.      . diphenhydrAMINE (BENADRYL) 12.5 MG/5ML liquid Take 25 mg by mouth at bedtime as needed for sleep.      . Prenatal Vit-Fe Fumarate-FA (PRENATAL MULTIVITAMIN) TABS Take 1 tablet by mouth daily at 12 noon.      . ranitidine (ZANTAC) 150 MG tablet Take 150 mg by mouth daily as needed for heartburn.        ROS Review of Systems  Constitutional: Negative.  HENT: Negative.  Eyes: Negative. Negative for blurred vision and double vision.  Respiratory: Negative.  Cardiovascular: Negative.  Gastrointestinal: Negative for diarrhea. Negative for abdominal pain.  Diarrhea x1 episode yesterday AM  Genitourinary: Negative for dysuria.  Musculoskeletal: Negative.  Skin: Negative.  Neurological: Negative for dizziness and headaches.  Endo/Heme/Allergies: Negative.  Psychiatric/Behavioral: Negative.     Physical Exam  Serial BP: 146/93, 148/91, 165/90, 151/97, 144/84 Blood pressure 165/90, pulse 82, temperature 97.5 F (36.4 C), resp. rate 22, last menstrual period 07/24/2012.  Physical Exam General: A&O x3, no apparent distress, calm, ambulatory, breathing with contractions  Abd: gravid, nontender  Vaginal Exam: 1.5 / 80%; soft / -3 / vtx - no blood on exam glove  Membranes: ruptured, small amount of  yellow fluid Extremities: mild pedal edema  MAU Course  Procedures    Assessment and Plan  40.[redacted] weeks gestation SIUP with PROM Category I FHR tracing  PROM  Gestational Hypertension PIH labs from earlier visit - normal except slightly elevated uric acid of 7.1  Negative proteinuria - from earlier visit GDM - A1    Plan:  1) Admit to L&D  2) Routine L&D orders 3) Planning pitocin and Foley bulb augmentation 4) Fentanyl 100 mcg every 1 hour x 3 doses   Raelyn Mora, M, MSN, CNM 05/09/2013, 4:32 AM

## 2013-05-09 NOTE — Progress Notes (Signed)
S: Doing well, pain manages with Epidural.     O: Filed Vitals:   05/09/13 1601 05/09/13 1616 05/09/13 1630 05/09/13 1631  BP: 95/66 127/74 133/97 133/97  Pulse: 84 90 104 111  Temp:   100.8 F (38.2 C)   TempSrc:   Axillary   Resp:  20 20   Height:      Weight:      SpO2:         FHT:  FHR: 165 bpm, variability: moderate,  accelerations:  Present,  decelerations:  Absent UC:   regular, every 2-3 minutes MVU: 265 Pitocin: 6 mU SVE:   Dilation: 6 Effacement (%): 80 Station: -2 Exam by:: Arita Miss, CNM   A / P: Protracted active phase with minimal cervical progression PROM Maternal Fever (100.8 axillary) likely chorioamnionitis  Fetal Wellbeing:  Category I and Category II Pain Control:  Epidural Tylenol 1000 mg po  Consult with Dr. Billy Coast for antibiotic therapy and decision for cesarean delivery  Cefatetan 2 grams IV now MOD: C/S  Discussed with patient decision for cesarean based on maternal fever and possibility of chorioamnionitis, LGA,    and protracted active labor phase - patient and family verbalized an understanding and ready to proceed with  cesarean delivery  Stephanie Long, M, MSN, CNM 05/09/2013, 4:38 PM

## 2013-05-09 NOTE — OR Nursing (Signed)
Foley in upon arrival to OR. 

## 2013-05-10 ENCOUNTER — Encounter (HOSPITAL_COMMUNITY): Payer: Self-pay | Admitting: *Deleted

## 2013-05-10 LAB — URINE CULTURE: Colony Count: >=100000

## 2013-05-10 LAB — CBC
HCT: 30.2 % — ABNORMAL LOW (ref 36.0–46.0)
Hemoglobin: 10.1 g/dL — ABNORMAL LOW (ref 12.0–15.0)
MCHC: 33.4 g/dL (ref 30.0–36.0)
RBC: 3.4 MIL/uL — ABNORMAL LOW (ref 3.87–5.11)
WBC: 19.4 10*3/uL — ABNORMAL HIGH (ref 4.0–10.5)

## 2013-05-10 NOTE — Progress Notes (Signed)
POSTOPERATIVE DAY # 1 S/P Primary Cesarean Section   S:         Reports feeling well, no pain             Tolerating po intake / no nausea / no vomiting / positive flatus / no BM             Bleeding is light             Pain controlled withIbuprofen             Up ad lib / ambulatory x1 / foley  Newborn breastfeeding    O:  VS: BP 111/75  Pulse 89  Temp(Src) 98.7 F (37.1 C) (Oral)  Resp 18  Ht 5\' 4"  (1.626 m)  Wt 117.028 kg (258 lb)  BMI 44.26 kg/m2  SpO2 94%  LMP 07/24/2012   LABS:  Recent Labs  05/09/13 0545 05/10/13 0645  WBC 18.5* 19.4*  HGB 12.7 10.1*  PLT 208 169                           I&O: Intake/Output     07/13 0701 - 07/14 0700 07/14 0701 - 07/15 0700   P.O.  360   I.V. (mL/kg) 1077 (9.2)    Total Intake(mL/kg) 1077 (9.2) 360 (3.1)   Urine (mL/kg/hr) 1200 (0.4)    Emesis/NG output 300 (0.1)    Blood 550 (0.2)    Total Output 2050     Net -973 +360                     Physical Exam:             Alert and Oriented X3  Lungs: Clear and unlabored  Heart: regular rate and rhythm / no mumurs  Abdomen: soft, non-tender, non-distended bsx4             Fundus: firm, non-tender, U-1             Dressing pressure dsg clean, dry, intact    Lochia: scant  Extremities: 1+ pedal rt, 2+ pedal lt edema, no calf pain or tenderness  A:        POD # 1 S/P Primary c/s- chorio/arrest of dilation              P:        Routine postoperative care   Advance activity as tolerated  Consider early discharge           Donette Larry, MSN, CNM 05/10/2013 1132

## 2013-05-10 NOTE — Anesthesia Postprocedure Evaluation (Signed)
Anesthesia Post Note  Patient: Stephanie Long  Procedure(s) Performed: Procedure(s) (LRB): primary cesarean section with delivery of baby girl at 1800.  Apgars 9/9. (N/A)  Anesthesia type: Epidural  Patient location: Mother/Baby  Post pain: Pain level controlled  Post assessment: Post-op Vital signs reviewed  Last Vitals:  Filed Vitals:   05/10/13 0555  BP:   Pulse: 86  Temp:   Resp: 20    Post vital signs: Reviewed  Level of consciousness:alert  Complications: No apparent anesthesia complications

## 2013-05-11 ENCOUNTER — Inpatient Hospital Stay (HOSPITAL_COMMUNITY): Admission: RE | Admit: 2013-05-11 | Payer: 59 | Source: Ambulatory Visit

## 2013-05-11 MED ORDER — OXYCODONE-ACETAMINOPHEN 5-325 MG PO TABS
1.0000 | ORAL_TABLET | ORAL | Status: DC | PRN
Start: 2013-05-11 — End: 2014-01-19

## 2013-05-11 MED ORDER — IBUPROFEN 600 MG PO TABS: 600.0000 mg | ORAL_TABLET | Freq: Four times a day (QID) | ORAL | Status: DC

## 2013-05-11 MED ORDER — DOCUSATE SODIUM 100 MG PO CAPS: 100.0000 mg | ORAL_CAPSULE | Freq: Two times a day (BID) | ORAL | Status: DC | PRN

## 2013-05-11 NOTE — Plan of Care (Signed)
Problem: Discharge Progression Outcomes Goal: Remove staples per MD order Outcome: Not Applicable Date Met:  05/11/13 Will remove in office

## 2013-05-11 NOTE — Progress Notes (Signed)
Patient ID: Stephanie Long, female   DOB: 02-18-1976, 37 y.o.   MRN: [id number] POD # 2  Subjective: Pt reports feeling well and eager for early d/c home/ Pain controlled with ibuprofen and percocet Tolerating po/Voiding without problems/ No n/v/Flatus pos Activity: out of bed and ambulate Bleeding is moderate Newborn info:  Information for the patient's newborn:  [name], Long [[id number]]  female Feeding: breast   Objective: VS: BP 115/54  Pulse 65  Temp(Src) 97.4 F (36.3 C) (Oral)  Resp 19    I&O: Intake/Output   07/14 0701 - 07/15  P.O. 840    I.V. (mL/kg)     Total Intake(mL/kg) 840 (7.2)    Urine (mL/kg/hr) 1700 (0.6)    Emesis/NG output     Blood     Total Output 1700     Net -860            LABS:  Recent Labs  05/09/13 0545 05/10/13 0645  WBC 18.5* 19.4*  HGB 12.7 10.1*  PLT 208 169                             Physical Exam:  General: alert, cooperative and no distress CV: Regular rate and rhythm Resp: clear Abdomen: soft, nontender, normal bowel sounds Incision: Covered with Tegaderm and honeycomb dressing; well approximated. Uterine Fundus: firm, below umbilicus, nontender Lochia: moderate Ext: edema +1 to +2 and Homans sign is negative, no sign of DVT    A/P: POD # 2/ G1P1001/ S/P Primary C/Section d/t Arrest of descent and Chorio Doing well and stable for discharge home RX's: Ibuprofen 600mg  po Q 6 hrs prn pain #30 Refill x 1 Percocet 5/325 1 - 2 tabs po every 6 hrs prn pain  #30 No refill Staple removal in the office this week; call pt with appt details Rt pp visit in 6 wks    Signed: Demetrius Revel, MSN, [facility] 05/11/2013, 9:32 AM

## 2013-05-11 NOTE — Discharge Summary (Signed)
Obstetric Discharge Summary Reason for Admission: G1 P0 @ 40wks with onset active labor, PROM.   Prenatal Procedures: NST and ultrasound Intrapartum Procedures: cesarean: low cervical, transverse Postpartum Procedures: none Complications-Operative and Postpartum: none Hemoglobin  Date Value Range Status  05/10/2013 10.1* 12.0 - 15.0 g/dL Final     DELTA CHECK NOTED     REPEATED TO VERIFY     HCT  Date Value Range Status  05/10/2013 30.2* 36.0 - 46.0 % Final    Physical Exam:  General: alert, cooperative and no distress Lochia: appropriate Uterine Fundus: firm Incision: Covered with Tegaderm and honeycomb dressing; well approximated. DVT Evaluation: No evidence of DVT seen on physical exam. Negative Homan's sign. Calf/Ankle edema is present, +2  Discharge Diagnoses: G1 P1 s/p primary c/s; arrest of descent, chorio. OK for early d/c home.  Afebrile > 24 hrs Staple removal in the office this week  Discharge Information: Date: 05/11/2013 Activity: pelvic rest Diet: routine Medications: Ibuprofen, Colace and Percocet Condition: stable Instructions: refer to practice specific booklet Discharge to: home Follow-up Information   Follow up with COUSINS,SHERONETTE A, MD In 6 weeks.   Contact information:   [address] [place] [address] [phone]       Newborn Data: Live born female on [birth year]/13 Birth Weight: 8 lb 3 oz (3715 g) APGAR: 9, 9  Home with mother.  Stephanie Long K 05/11/2013, 9:52 AM

## 2013-05-25 ENCOUNTER — Ambulatory Visit (HOSPITAL_COMMUNITY)
Admission: RE | Admit: 2013-05-25 | Discharge: 2013-05-25 | Disposition: A | Discharge status: Home / Self Care | Payer: 59 | Source: Ambulatory Visit | Attending: Obstetrics and Gynecology | Admitting: Obstetrics and Gynecology

## 2013-05-25 NOTE — Lactation Note (Addendum)
Adult Lactation Consultation Outpatient Visit Note  Patient Name: Stephanie Long Date of Birth: [birth year] Gestational Age at Delivery: Unknown Type of Delivery: 7/13 C/S Birth weight - 8-3 oz , D/C wt- 7-11 oz , 1st DR. Visit - 7-3 oz , ( asked to supplement with formula 20 ml after breast feeding , 2nd visit- 7-9 oz, 7/24 MD visit -7-8.5 oz - restart supplement                               Today's weight= 7-13.4 oz   Per mom has a history of PCOs, some breast changes noted during pregnancy per mom, also PIH , D/C on HCTZ for 5 days .  Breastfeeding History: Frequency of Breastfeeding: per mom every 2-3 hours  Length of Feeding: 30-60 mins  Voids: 5-6 wet   Stools: irregular every 3 days and has a large stool   Supplementing / Method: Pumping:  Type of Pump:DEBP Medela    Frequency: 1-2 x's per day   Volume:  15- 20 ml   Comments:- LC recommended increasing the pumping due to her history of PCOS , PIH , HCTZ post del ,  plenty fluids and post pumping after 406 feedings a day for 10-15 mins , save milk and supplement it back  to baby. Postpump both breast together.     Consultation Evaluation: LC noted both breast are full feeling ( last feeding at 0700 15 mins each breast ) , nipples appear healthy and no breakdown noted.                                              Initial Feeding Assessment: Pre-feed Weight:7-13.4 oz 3554 g  Post-feed Weight: 7-14.2 oz 3576 g  Amount Transferred: 22 ml  Comments: Baby Stephanie Long fed for 20 mins , needed a lot of stimulation when feeding , noted her to be intermittently                     just hanging out ( non- nutritive sucking ), added SNS ( with moms permission ) , Stephanie Long was able to still sustain pattern , but became more nutritive and took 10 ml . Tolerated well.  ( pointed it out to mom , she agreed and mentioned she does it at home )                      Discussed with mom the need of shifting Stephanie Long so she would be more nutritive ( LC  recommended adding an SNS                     at the breast for many feedings,if it was ever to over whelming to supplement with the Medela broad based bottle.                     Stephanie Long latches well ,mom needed some help to obtain the depth at the breast. Also had mom use the breast compression                     technique to enhance the flow to baby,noted increased swallows when mom did that.   Additional Feeding Assessment: Pre-feed Weight: 7-13.7 oz 3562g ( re-weight after a wet diaper)  Post-feed Weight:7-14.2 oz 3576g  Amount Transferred: 14 ml  Comment - Stephanie Long , more aggressive on the 2nd breast ( right ) , sustained a consistent pattern , less hanging out ( non - nutritive sucking noted ).  Total Breast milk Transferred this Visit: 26 ml  Total Supplement Given: 10 ml   Total transferred with supplement ( SNS ) 36 ml  Additional Interventions: Mom aware of need to supplement with a bottle and to increase calories.   Lactation Plan of Care - Keep up the great efforts breast feeding ( praised mom )                                          - Encouraged naps , rest , plenty fluids , esp H20, snacks , meals ,                                           - Breast feeding goals for Stephanie Long - increase nutritive sucking pattern , skin to skin feedings as much as possible ( until Stephanie Long gets aggressive at the breast )                                           - Feeding options - using SNS with feeding , latch feed 20 mins and supplement ( cut her time down at the breast ) , @ night to tired , have dad feed a bottle and mom pumps 15 -20 mins and goes back to                                              Bed( plan is aiming towards getting more rest .                                          - extra pumping after 4-6 feedings a day for 10-15 mins  Follow-Up- Per mom F/U apt with Dr. Rogelio Long ( [facility])                   - LC recommended to call [facility] for weight check Friday 8/1                   - F/U  with lactation - August 6 th Wednesday                         Stephanie Long 05/25/2013, 10:06 AM

## 2013-06-02 ENCOUNTER — Ambulatory Visit (HOSPITAL_COMMUNITY)
Admission: RE | Admit: 2013-06-02 | Discharge: 2013-06-02 | Disposition: A | Discharge status: Home / Self Care | Payer: 59 | Source: Ambulatory Visit | Attending: Obstetrics and Gynecology | Admitting: Obstetrics and Gynecology

## 2013-06-02 NOTE — Lactation Note (Signed)
Adult Lactation Consultation Outpatient Visit Note  Patient Name: Stephanie Long(MOTHER)  BABY: EMMA Emmer Date of Birth: [birth year]                                         DOB: [birth year] Gestational Age at Delivery:                                     BIRTH WEIGHT: 8-3 Type of Delivery:                                                       DISCHARGE WEIGHT: 7-11                                                                                   WEIGHT ON 05/25/13: 7-13.4    WEIGHT TODAY: 8-7.3 Breastfeeding History: Frequency of Breastfeeding: every 2-3 hours                                            Length of Feeding: 15-30 minutes Voids: 6-7 voids Stools: 2   Supplementing / Method:2-3 oz formula every 2-3 hours( 1 supplement of EBM per day) Pumping:  Type of Pump:PUMP IN STYLE   Frequency:2-3 TIMES/DAY  Volume:  10 MLS AFTER FEEDING 20-40 MLS IN PLACE OF FEEDING  Comments:    Consultation Evaluation:Mom and 44 day old infant here for feeding assessment.  Mom and baby here for a follow up appointment for low milk supply and slow weight gain.  Mom has been supplementing with bottles because SNS too difficult.  Observed baby latching easily and deeply to breast.  Baby does nutritive sucking for about 7-8 minutes before becoming sleepy.  24 mm nipple shield placed to see if this would provide more oral stimulation.  Baby did nurse longer and more effectively with shield.  Discussed at length possibilities of reasons milk supply is not adequate and she will try pumping for a few days and bottle feed to see if this will provide more stimulation to breasts.  We also discussed that any breast milk is valuable to the baby.  Mom will continue working on supply and call for follow up in 1-2 weeks.  Initial Feeding Assessment:20-30 minutes each breast. Pre-feed [id number] Post-feed [id number] Amount Transferred:22 mls Comments:  Additional Feeding Assessment: Pre-feed  Weight: Post-feed Weight: Amount Transferred: Comments:  Additional Feeding Assessment: Pre-feed Weight: Post-feed Weight: Amount Transferred: Comments:  Total Breast milk Transferred this Visit: 22 mls Total Supplement Given: 60 mls  Additional Interventions:   Follow-Up mom will call for follow up visit in next 1-2 weeks and also try to attend support group      Hansel Feinstein 06/02/2013, 11:41 AM

## 2013-07-30 ENCOUNTER — Encounter: Payer: Self-pay | Admitting: Internal Medicine

## 2013-07-30 ENCOUNTER — Ambulatory Visit (INDEPENDENT_AMBULATORY_CARE_PROVIDER_SITE_OTHER): Payer: 59 | Admitting: Internal Medicine

## 2013-07-30 VITALS — BP 110/74 | HR 74 | Temp 97.9°F | Wt 230.5 lb

## 2013-07-30 DIAGNOSIS — J020 Streptococcal pharyngitis: Secondary | ICD-10-CM

## 2013-07-30 LAB — POCT RAPID STREP A (OFFICE): Rapid Strep A Screen: POSITIVE — AB

## 2013-07-30 MED ORDER — AMOXICILLIN 500 MG PO CAPS: 500.0000 mg | ORAL_CAPSULE | Freq: Three times a day (TID) | ORAL | Status: DC

## 2013-07-30 NOTE — Progress Notes (Signed)
HPI  Pt presents to the clinic today with c/o sore throat and itchy ears. This started 2 days ago. She has noticed white patches on her throat. She denies fever or rash but she has felt fatigued. She has not taken anything OTC.  She does have a history of allergies and asthma. She is taking a Zyrtec daily along with albuterol when needed.  Review of Systems      Past Medical History  Diagnosis Date  . Asthma   . Allergy   . GERD (gastroesophageal reflux disease)   . PCOS (polycystic ovarian syndrome) 2012  . Diabetes mellitus without complication   . Gestational diabetes   . Hx of varicella   . Gestational hypertension 05/09/2013    Family History  Problem Relation Age of Onset  . Diabetes Father   . Heart disease Father   . Hyperlipidemia Father   . Heart attack Father   . Cancer Neg Hx   . Asthma Neg Hx   . Early death Neg Hx   . Depression Mother   . Stroke Maternal Grandmother     History   Social History  . Marital Status: Married    Spouse Name: N/A    Number of Children: N/A  . Years of Education: N/A   Occupational History  . Not on file.   Social History Main Topics  . Smoking status: Never Smoker   . Smokeless tobacco: Never Used  . Alcohol Use: 1.1 oz/week    1 Glasses of wine, 1 Drinks containing 0.5 oz of alcohol per week  . Drug Use: No  . Sexual Activity: Yes    Partners: Male   Other Topics Concern  . Not on file   Social History Narrative  . No narrative on file    No Known Allergies   Constitutional: Positive fatigue. Denies headache, fever or abrupt weight changes.  HEENT:  Positive sore throat. Denies eye redness, eye pain, pressure behind the eyes, facial pain, nasal congestion, ear pain, ringing in the ears, wax buildup, runny nose or bloody nose. Respiratory:  Denies cough, difficulty breathing or shortness of breath.  Cardiovascular: Denies chest pain, chest tightness, palpitations or swelling in the hands or feet.   No other  specific complaints in a complete review of systems (except as listed in HPI above).  Objective:   BP 110/74  Pulse 74  Temp(Src) 97.9 F (36.6 C) (Oral)  Wt 230 lb 8 oz (104.554 kg)  BMI 39.55 kg/m2  SpO2 98% Wt Readings from Last 3 Encounters:  07/30/13 230 lb 8 oz (104.554 kg)  05/09/13 258 lb (117.028 kg)  05/09/13 258 lb (117.028 kg)     General: Appears her stated age, obese but well developed, well nourished in NAD. HEENT: Head: normal shape and size; Eyes: sclera white, no icterus, conjunctiva pink, PERRLA and EOMs intact; Ears: Tm's gray and intact, normal light reflex; Nose: mucosa pink and moist, septum midline; Throat/Mouth: + PND. Teeth present, mucosa erythematous and moist, white exudate noted on bilateral tonsillar pillars, no lesions or ulcerations noted.  Neck: Mild tonsillar lymphadenopathy noted on right. Neck supple, trachea midline. No massses, lumps or thyromegaly present.  Cardiovascular: Normal rate and rhythm. S1,S2 noted.  No murmur, rubs or gallops noted. No JVD or BLE edema. No carotid bruits noted. Pulmonary/Chest: Normal effort and positive vesicular breath sounds. No respiratory distress. No wheezes, rales or ronchi noted.      Assessment & Plan:   Strep pharyngitis, new onset:  Get some rest and drink plenty of water Do salt water gargles for the sore throat eRx for Amoxil TID x 10 days Ibuprofen for pain/fever  RTC as needed or if symptoms persist.

## 2013-07-30 NOTE — Addendum Note (Signed)
Addended by: Darnell Level on: 07/30/2013 11:19 AM   Modules accepted: Orders

## 2013-07-30 NOTE — Patient Instructions (Signed)
Strep Throat  Strep throat is an infection of the throat caused by a bacteria named Streptococcus pyogenes. Your caregiver may call the infection streptococcal "tonsillitis" or "pharyngitis" depending on whether there are signs of inflammation in the tonsils or back of the throat. Strep throat is most common in children aged 35 15 years during the cold months of the year, but it can occur in people of any age during any season. This infection is spread from person to person (contagious) through coughing, sneezing, or other close contact.  SYMPTOMS   · Fever or chills.  · Painful, swollen, red tonsils or throat.  · Pain or difficulty when swallowing.  · White or yellow spots on the tonsils or throat.  · Swollen, tender lymph nodes or "glands" of the neck or under the jaw.  · Red rash all over the body (rare).  DIAGNOSIS   Many different infections can cause the same symptoms. A test must be done to confirm the diagnosis so the right treatment can be given. A "rapid strep test" can help your caregiver make the diagnosis in a few minutes. If this test is not available, a light swab of the infected area can be used for a throat culture test. If a throat culture test is done, results are usually available in a day or two.  TREATMENT   Strep throat is treated with antibiotic medicine.  HOME CARE INSTRUCTIONS   · Gargle with 1 tsp of salt in 1 cup of warm water, 3 4 times per day or as needed for comfort.  · Family members who also have a sore throat or fever should be tested for strep throat and treated with antibiotics if they have the strep infection.  · Make sure everyone in your household washes their hands well.  · Do not share food, drinking cups, or personal items that could cause the infection to spread to others.  · You may need to eat a soft food diet until your sore throat gets better.  · Drink enough water and fluids to keep your urine clear or pale yellow. This will help prevent dehydration.  · Get plenty of  rest.  · Stay home from school, daycare, or work until you have been on antibiotics for 24 hours.  · Only take over-the-counter or prescription medicines for pain, discomfort, or fever as directed by your caregiver.  · If antibiotics are prescribed, take them as directed. Finish them even if you start to feel better.  SEEK MEDICAL CARE IF:   · The glands in your neck continue to enlarge.  · You develop a rash, cough, or earache.  · You cough up green, yellow-brown, or bloody sputum.  · You have pain or discomfort not controlled by medicines.  · Your problems seem to be getting worse rather than better.  SEEK IMMEDIATE MEDICAL CARE IF:   · You develop any new symptoms such as vomiting, severe headache, stiff or painful neck, chest pain, shortness of breath, or trouble swallowing.  · You develop severe throat pain, drooling, or changes in your voice.  · You develop swelling of the neck, or the skin on the neck becomes red and tender.  · You have a fever.  · You develop signs of dehydration, such as fatigue, dry mouth, and decreased urination.  · You become increasingly sleepy, or you cannot wake up completely.  Document Released: 10/11/2000 Document Revised: 09/30/2012 Document Reviewed: 12/13/2010  [employer]® Patient Information ©2014 [employer].

## 2013-09-02 ENCOUNTER — Other Ambulatory Visit: Payer: Self-pay

## 2014-01-17 ENCOUNTER — Other Ambulatory Visit: Payer: Self-pay | Admitting: Physician Assistant

## 2014-01-17 ENCOUNTER — Telehealth: Payer: Self-pay | Admitting: Internal Medicine

## 2014-01-17 ENCOUNTER — Other Ambulatory Visit: Payer: Self-pay | Admitting: Internal Medicine

## 2014-01-17 DIAGNOSIS — J4530 Mild persistent asthma, uncomplicated: Secondary | ICD-10-CM

## 2014-01-17 NOTE — Telephone Encounter (Signed)
Pt has breathing problems.  Requested an inhaler.  Per Dr. Yetta BarreJones advised she will need an appt.  She has declined appointments with a doctor in this office.  She wants a referral to pulmonary.

## 2014-01-19 ENCOUNTER — Encounter (INDEPENDENT_AMBULATORY_CARE_PROVIDER_SITE_OTHER): Payer: 59 | Admitting: Internal Medicine

## 2014-01-19 ENCOUNTER — Ambulatory Visit (INDEPENDENT_AMBULATORY_CARE_PROVIDER_SITE_OTHER): Payer: 59 | Admitting: Internal Medicine

## 2014-01-19 ENCOUNTER — Encounter: Payer: Self-pay | Admitting: Internal Medicine

## 2014-01-19 VITALS — BP 128/86 | HR 73 | Temp 97.9°F | Ht 64.0 in | Wt 234.0 lb

## 2014-01-19 DIAGNOSIS — J4530 Mild persistent asthma, uncomplicated: Secondary | ICD-10-CM

## 2014-01-19 DIAGNOSIS — J45909 Unspecified asthma, uncomplicated: Secondary | ICD-10-CM

## 2014-01-19 MED ORDER — BUDESONIDE-FORMOTEROL FUMARATE 80-4.5 MCG/ACT IN AERO: INHALATION_SPRAY | RESPIRATORY_TRACT | Status: DC

## 2014-01-19 MED ORDER — RANITIDINE HCL 150 MG PO TABS: ORAL_TABLET | ORAL | Status: DC

## 2014-01-19 NOTE — Progress Notes (Signed)
   Subjective:    Patient ID: Stephanie Long, female    DOB: [birth year]  MRN: [id number]  HPI  [age] yowf never smoker with ? EIA ages elementary thru HS  With prn albuterol and did fine until in early 30s and then developed wheezing ? Related to old house rx albuterol /symbicort better p left house then rarely needed then flared again with pregnancy had baby July 2014 but not improved p delivery with continued sensation  Of can't get a deep breath with occ noct episodes on prn saba so referred 01/19/2014  by Dr Yetta BarreJones.  01/19/2014 1st [facility] office visit/ Stephanie Long  Chief Complaint  Patient presents with  . Pulmonary Consult    Referred by Dr. Jonny RuizJohn for Asthma  ex no problem and not using saba for this - episode of resting sensation of can't get a full breath ever since pregnancy occur randomly during the day.  Noct once a month wakes up with wheeze/ cough Albuterol once a week. No purulent sputum  No obvious other patterns in day to day or daytime variabilty or assoc   cp or    overt sinus or hb symptoms. No unusual exp hx or h/o childhood pna/ asthma or knowledge of premature birth.  Sleeping ok without nocturnal  or early am exacerbation  of respiratory  c/o's or need for noct saba. Also denies any obvious fluctuation of symptoms with weather or environmental changes or other aggravating or alleviating factors except as outlined above   Current Medications, Allergies, Complete Past Medical History, Past Surgical History, Family History, and Social History were reviewed in [facility] electronic medical record.             Review of Systems  Constitutional: Negative for fever and unexpected weight change.  HENT: Negative for congestion, dental problem, ear pain, nosebleeds, postnasal drip, rhinorrhea, sinus pressure, sneezing, sore throat and trouble swallowing.   Eyes: Negative for redness and itching.  Respiratory: Positive for shortness of breath and wheezing.  Negative for cough and chest tightness.   Cardiovascular: Negative for palpitations and leg swelling.  Gastrointestinal: Positive for abdominal distention. Negative for nausea and vomiting.  Endocrine: Negative for polyuria.  Genitourinary: Negative for dysuria.  Musculoskeletal: Negative for joint swelling.  Skin: Negative for rash.  Neurological: Negative for headaches.  Hematological: Does not bruise/bleed easily.  Psychiatric/Behavioral: Negative for dysphoric mood. The patient is not nervous/anxious.        Objective:   Physical Exam  amb wf nad   Wt Readings from Last 3 Encounters:  01/19/14 234 lb (106.142 kg)  01/19/14 234 lb (106.142 kg)  07/30/13 230 lb 8 oz (104.554 kg)       HEENT: nl dentition, turbinates, and orophanx. Nl external ear canals without cough reflex   NECK :  without JVD/Nodes/TM/ nl carotid upstrokes bilaterally   LUNGS: no acc muscle use, trace wheeze vs pseudowheeze   CV:  RRR  no s3 or murmur or increase in P2, no edema   ABD:  soft and nontender with nl excursion in the supine position. No bruits or organomegaly, bowel sounds nl  MS:  warm without deformities, calf tenderness, cyanosis or clubbing  SKIN: warm and dry without lesions    NEURO:  alert, approp, no deficits     Spirometry 01/19/2014  wnl      Assessment & Plan:

## 2014-01-19 NOTE — Patient Instructions (Signed)
symbicort 80 Take 2 puffs first thing in am and then another 2 puffs about 12 hours later to use if needed   Zantac 150 twice daily automatically   GERD (REFLUX)  is an extremely common cause of respiratory symptoms, many times with no significant heartburn at all.    It can be treated with medication, but also with lifestyle changes including avoidance of late meals, excessive alcohol, smoking cessation, and avoid fatty foods, chocolate, peppermint, colas, red wine, and acidic juices such as orange juice.  NO MINT OR MENTHOL PRODUCTS SO NO COUGH DROPS  USE SUGARLESS CANDY INSTEAD (jolley ranchers or Stover's)  NO OIL BASED VITAMINS - use powdered substitutes.  Please schedule a follow up office visit in 6 weeks, call sooner if needed

## 2014-01-19 NOTE — Progress Notes (Signed)
 This encounter was created in error - please disregard.

## 2014-01-20 NOTE — Assessment & Plan Note (Addendum)
Not clear all her symptoms are asthma and some are disproportionate to objective findings and not clear this is all a  lung problem but pt does appear to have difficult airway management issues.   Adherence is always the initial "prime suspect" and is a multilayered concern that requires a "trust but verify" approach in every patient - starting with knowing how to use medications, especially inhalers, correctly, keeping up with refills and understanding the fundamental difference between maintenance and prns vs those medications only taken for a very short course and then stopped and not refilled.  - The proper method of use, as well as anticipated side effects, of a metered-dose inhaler are discussed and demonstrated to the patient. Improved effectiveness after extensive coaching during this visit to a level of approximately  90% so try symbicort 80 2bid to see which of her symptoms respond  ? Acid (or non-acid) GERD > always difficult to exclude as up to 75% of pts in some series report no assoc GI/ Heartburn symptoms and she has risk due to wt gain assoc with pregnancy initially when she first noted the new symptoms and now maintained near same wt and on bcps> rec max (24h) zantac rx and diet restrictions/ reviewed and instructions given in writing.   ? Anxiety > dx of exclusion   ? Allergy > w/u next ov if not better.

## 2014-02-08 ENCOUNTER — Institutional Professional Consult (permissible substitution): Payer: 59 | Admitting: Emergency Medicine

## 2014-02-28 ENCOUNTER — Ambulatory Visit: Payer: 59 | Admitting: Internal Medicine

## 2014-03-02 ENCOUNTER — Encounter: Payer: Self-pay | Admitting: Internal Medicine

## 2014-03-02 ENCOUNTER — Ambulatory Visit (INDEPENDENT_AMBULATORY_CARE_PROVIDER_SITE_OTHER): Payer: 59 | Admitting: Internal Medicine

## 2014-03-02 VITALS — BP 124/82 | HR 70 | Temp 97.6°F | Ht 64.0 in | Wt 235.0 lb

## 2014-03-02 DIAGNOSIS — J4530 Mild persistent asthma, uncomplicated: Secondary | ICD-10-CM

## 2014-03-02 DIAGNOSIS — J45909 Unspecified asthma, uncomplicated: Secondary | ICD-10-CM

## 2014-03-02 MED ORDER — BUDESONIDE-FORMOTEROL FUMARATE 80-4.5 MCG/ACT IN AERO: INHALATION_SPRAY | RESPIRATORY_TRACT | Status: DC

## 2014-03-02 NOTE — Progress Notes (Signed)
Subjective:    Patient ID: Stephanie Long, female    DOB: [birth year]  MRN: [id number]  HPI  [age] yowf never smoker with ? EIA ages elementary thru HS  With prn albuterol and did fine until in early 30s and then developed wheezing ? Related to old house rx albuterol /symbicort better p left house then rarely needed then flared again with pregnancy had baby July 2014 but not improved p delivery with continued sensation  Of can't get a deep breath with occ noct episodes on prn saba so referred 01/19/2014  by Dr Yetta BarreJones.  01/19/2014 1st [facility] office visit/ Wilkins Elpers  Chief Complaint  Patient presents with  . Pulmonary Consult    Referred by Dr. Jonny RuizJohn for Asthma  ex no problem and not using saba for this - episode of resting sensation of can't get a full breath ever since pregnancy occur randomly during the day.  Noct once a month wakes up with wheeze/ cough Albuterol once a week. No purulent sputum rec symbicort 80 Take 2 puffs first thing in am and then another 2 puffs about 12 hours later to use if needed  Zantac 150 twice daily automatically  GERD (REFLUX)    03/02/2014 f/u ov/Avin Gibbons re:  Asthma vs pseudoasthma Chief Complaint  Patient presents with  . Follow-up    Pt states that her SOB has improved some, still has occ wheezing.  She has used symbicort for rescue inhaler x 2 since last visit.     Overall much better on gerd rx   No obvious day to day or daytime variabilty or assoc chronic cough or cp or chest tightness, subjective wheeze overt sinus or hb symptoms. No unusual exp hx or h/o childhood pna/ asthma or knowledge of premature birth.  Sleeping ok without nocturnal  or early am exacerbation  of respiratory  c/o's or need for noct saba. Also denies any obvious fluctuation of symptoms with weather or environmental changes or other aggravating or alleviating factors except as outlined above   Current Medications, Allergies, Complete Past Medical History, Past Surgical  History, Family History, and Social History were reviewed in [facility] electronic medical record.  ROS  The following are not active complaints unless bolded sore throat, dysphagia, dental problems, itching, sneezing,  nasal congestion or excess/ purulent secretions, ear ache,   fever, chills, sweats, unintended wt loss, pleuritic or exertional cp, hemoptysis,  orthopnea pnd or leg swelling, presyncope, palpitations, heartburn, abdominal pain, anorexia, nausea, vomiting, diarrhea  or change in bowel or urinary habits, change in stools or urine, dysuria,hematuria,  rash, arthralgias, visual complaints, headache, numbness weakness or ataxia or problems with walking or coordination,  change in mood/affect or memory.                        Objective:   Physical Exam  amb wf nad  03/02/2014          235  Wt Readings from Last 3 Encounters:  01/19/14 234 lb (106.142 kg)  01/19/14 234 lb (106.142 kg)  07/30/13 230 lb 8 oz (104.554 kg)       HEENT: nl dentition, turbinates, and orophanx. Nl external ear canals without cough reflex   NECK :  without JVD/Nodes/TM/ nl carotid upstrokes bilaterally   LUNGS: no acc muscle use, no longer any wheeze or pseudowheeze   CV:  RRR  no s3 or murmur or increase in P2, no edema   ABD:  soft and  nontender with nl excursion in the supine position. No bruits or organomegaly, bowel sounds nl  MS:  warm without deformities, calf tenderness, cyanosis or clubbing  SKIN: warm and dry without lesions    NEURO:  alert, approp, no deficits     Spirometry 01/19/2014  wnl      Assessment & Plan:

## 2014-03-02 NOTE — Patient Instructions (Addendum)
Work on keeping up perfect inhaler technique:  relax and gently blow all the way out then take a nice smooth deep breath back in, triggering the inhaler at same time you start breathing in.  Hold for up to 5 seconds if you can.  Blow out thru your nose  Continue the zantac 150 twice daily and agree with using a non hormonal method for birth control   If you are satisfied with your treatment plan let your doctor know and he/she can either refill your medications or you can return here when your prescription runs out.     If in any way you are not 100% satisfied,  please tell us.  If 100% better, tell your friends!

## 2014-03-03 NOTE — Assessment & Plan Note (Signed)
-   hfa 90% p coaching 01/19/14 > trial of symbicort 80 2bid> using prn as of 5/6/1  Still not clear whether has primary airways dz or this is all GERD/lpr related pseudoashthma > rec continue max gerd rx and wt loss and consider non-hormonal birth control  See instructions for specific recommendations which were reviewed directly with the patient who was given a copy with highlighter outlining the key components.

## 2014-07-18 ENCOUNTER — Other Ambulatory Visit: Payer: Self-pay | Admitting: Obstetrics and Gynecology

## 2014-07-18 DIAGNOSIS — N6452 Nipple discharge: Secondary | ICD-10-CM

## 2014-08-04 ENCOUNTER — Ambulatory Visit
Admission: RE | Admit: 2014-08-04 | Discharge: 2014-08-04 | Disposition: A | Discharge status: Home / Self Care | Payer: 59 | Source: Ambulatory Visit | Attending: Obstetrics and Gynecology | Admitting: Obstetrics and Gynecology

## 2014-08-04 DIAGNOSIS — N6452 Nipple discharge: Secondary | ICD-10-CM

## 2014-08-29 ENCOUNTER — Encounter: Payer: Self-pay | Admitting: Internal Medicine

## 2014-10-18 ENCOUNTER — Other Ambulatory Visit: Payer: Self-pay | Admitting: Physician Assistant

## 2014-10-28 ENCOUNTER — Other Ambulatory Visit: Payer: Self-pay | Admitting: Internal Medicine

## 2014-10-28 MED ORDER — BUDESONIDE-FORMOTEROL FUMARATE 80-4.5 MCG/ACT IN AERO: INHALATION_SPRAY | RESPIRATORY_TRACT | Status: DC

## 2015-05-15 ENCOUNTER — Encounter: Payer: Self-pay | Admitting: Internal Medicine

## 2015-05-15 ENCOUNTER — Ambulatory Visit (INDEPENDENT_AMBULATORY_CARE_PROVIDER_SITE_OTHER): Payer: 59 | Admitting: Internal Medicine

## 2015-05-15 VITALS — BP 140/90 | Temp 98.5°F | Wt 245.1 lb

## 2015-05-15 DIAGNOSIS — J029 Acute pharyngitis, unspecified: Secondary | ICD-10-CM | POA: Diagnosis not present

## 2015-05-15 DIAGNOSIS — J069 Acute upper respiratory infection, unspecified: Secondary | ICD-10-CM

## 2015-05-15 LAB — POCT RAPID STREP A (OFFICE): RAPID STREP A SCREEN: NEGATIVE

## 2015-05-15 NOTE — Progress Notes (Signed)
Pre visit review using our clinic review tool, if applicable. No additional management support is needed unless otherwise documented below in the visit note. 

## 2015-05-15 NOTE — Progress Notes (Signed)
Subjective:    Patient ID: Stephanie Long, female    DOB: [birth year], 39 y.o.   MRN: [id number]  HPI  39 year old white female with history of asthma complains of sore throat over the past 2-3 days. She also complains of body aches and chills. She has not taken her temperature at home. She noticed faint white patches over tonsils last night. She reports painful swallowing.  Patient has 37 year old daughter at home. But she is not sick.  Review of Systems Negative for fever    Past Medical History  Diagnosis Date  . Asthma   . Allergy   . GERD (gastroesophageal reflux disease)   . PCOS (polycystic ovarian syndrome) 2012  . Diabetes mellitus without complication   . Gestational diabetes   . Hx of varicella   . Gestational hypertension 05/09/2013    History   Social History  . Marital Status: Married    Spouse Name: N/A  . Number of Children: N/A  . Years of Education: N/A   Occupational History  . Not on file.   Social History Main Topics  . Smoking status: Never Smoker   . Smokeless tobacco: Never Used  . Alcohol Use: 1.1 oz/week    1 Glasses of wine, 1 Standard drinks or equivalent per week  . Drug Use: No  . Sexual Activity:    Partners: Male   Other Topics Concern  . Not on file   Social History Narrative    Past Surgical History  Procedure Laterality Date  . Wisdom tooth extraction    . Wisdom tooth extraction    . Cesarean section N/A 05/09/2013    Procedure: primary cesarean section with delivery of baby girl at 1800.  Apgars 9/9.;  Surgeon: Lenoard Adenichard J Taavon, MD;  Location: [facility];  Service: Obstetrics;  Laterality: N/A;    Family History  Problem Relation Age of Onset  . Diabetes Father   . Heart disease Father   . Hyperlipidemia Father   . Heart attack Father   . Cancer Neg Hx   . Asthma Neg Hx   . Early death Neg Hx   . Depression Mother   . Stroke Maternal Grandmother     No Known Allergies  Current Outpatient Prescriptions on  File Prior to Visit  Medication Sig Dispense Refill  . acetaminophen (TYLENOL) 500 MG tablet Take 500 mg by mouth every 6 (six) hours as needed for pain.     . budesonide-formoterol (SYMBICORT) 80-4.5 MCG/ACT inhaler Up to 2 pffs every 12 hours as needed 1 Inhaler 11  . calcium carbonate (TUMS - DOSED IN MG ELEMENTAL CALCIUM) 500 MG chewable tablet Chew 1 tablet by mouth daily.    . cetirizine (ZYRTEC) 10 MG tablet Take 10 mg by mouth daily as needed.     . diphenhydrAMINE (BENADRYL) 12.5 MG/5ML liquid Take 25 mg by mouth at bedtime as needed for sleep.    Marland Kitchen. ibuprofen (ADVIL,MOTRIN) 600 MG tablet Take 1 tablet (600 mg total) by mouth every 6 (six) hours. 30 tablet 1  . Prenatal Vit-Fe Fumarate-FA (PRENATAL MULTIVITAMIN) TABS Take 1 tablet by mouth daily at 12 noon.    . ranitidine (ZANTAC) 150 MG tablet One after breakfast and one after supper or bedtime (Patient not taking: Reported on 05/15/2015) 60 tablet 2  . triamcinolone cream (KENALOG) 0.1 % Apply 1 application topically 2 (two) times daily.     No current facility-administered medications on file prior to visit.  BP 140/90 mmHg  Temp(Src) 98.5 F (36.9 C) (Oral)  Wt 245 lb 1.6 oz (111.177 kg)    Objective:   Physical Exam        Assessment & Plan:

## 2015-05-15 NOTE — Patient Instructions (Signed)
Your rapid strep was negative Gargle with warm salt water and use nasal saline Drink plenty of fluids and rest. You can use over the counter tylenol for aches and pains Please contact our office if your symptoms do not improve or gets worse.

## 2015-05-15 NOTE — Assessment & Plan Note (Signed)
Patient likely has viral URI. Her rapid strep was negative. Patient advised to gargle with warm salt water, increase fluid intake and get plenty of rest. Patient advised to call office if symptoms persist or worsen.

## 2015-12-15 ENCOUNTER — Ambulatory Visit (INDEPENDENT_AMBULATORY_CARE_PROVIDER_SITE_OTHER): Payer: 59 | Admitting: Family Medicine

## 2015-12-15 ENCOUNTER — Encounter: Payer: Self-pay | Admitting: Family Medicine

## 2015-12-15 VITALS — BP 133/81 | HR 93 | Temp 98.7°F | Resp 20 | Wt 240.5 lb

## 2015-12-15 DIAGNOSIS — R509 Fever, unspecified: Secondary | ICD-10-CM | POA: Diagnosis not present

## 2015-12-15 DIAGNOSIS — J0100 Acute maxillary sinusitis, unspecified: Secondary | ICD-10-CM

## 2015-12-15 LAB — POCT INFLUENZA A/B
INFLUENZA A, POC: NEGATIVE
Influenza B, POC: NEGATIVE

## 2015-12-15 MED ORDER — AMOXICILLIN-POT CLAVULANATE 875-125 MG PO TABS: 1.0000 | ORAL_TABLET | Freq: Two times a day (BID) | ORAL | Status: DC

## 2015-12-15 MED FILL — AMOX-CLAV 875-125 MG TABLET: 875-125 | 10 days supply | Qty: 20 | Fill #0

## 2015-12-15 NOTE — Progress Notes (Signed)
Patient ID: Stephanie Long, female   DOB: 15-Jul-1976, 40 y.o.   MRN: [id number]    Stephanie Long , June 29, 1976, 34 y.o., female MRN: [id number]  CC: Cough Subjective: Pt presents for an acute OV with complaints of cough, congestion, myalgia  of duration. Over the weekend she experienced nausea, vomit, diarrhea and then was improving until Wednesday. Then she had afever of 101F, increase in cough.  Decreased appetite, but everything tolerating PO.  Family diagnosed with Flu (tested negative) this week and last week with GI bug. Pt is a Marine scientist.  She has tried Alk, Sudafed, and NSAIDS, last antipyretic 10 am . UTD with flu shot and tdap  No Known Allergies Social History  Substance Use Topics  . Smoking status: Never Smoker   . Smokeless tobacco: Never Used  . Alcohol Use: 1.1 oz/week    1 Glasses of wine, 1 Standard drinks or equivalent per week   Past Medical History  Diagnosis Date  . Asthma   . Allergy   . GERD (gastroesophageal reflux disease)   . PCOS (polycystic ovarian syndrome) 2012  . Diabetes mellitus without complication ([place])   . Gestational diabetes   . Hx of varicella   . Gestational hypertension 05/09/2013   Past Surgical History  Procedure Laterality Date  . Wisdom tooth extraction    . Wisdom tooth extraction    . Cesarean section N/A 05/09/2013    Procedure: primary cesarean section with delivery of baby girl at 87.  Apgars 9/9.;  Surgeon: Lovenia Kim, MD;  Location: [facility];  Service: Obstetrics;  Laterality: N/A;   Family History  Problem Relation Age of Onset  . Diabetes Father   . Heart disease Father   . Hyperlipidemia Father   . Heart attack Father   . Cancer Neg Hx   . Asthma Neg Hx   . Early death Neg Hx   . Depression Mother   . Stroke Maternal Grandmother      Medication List       This list is accurate as of: 12/15/15  2:43 PM.  Always use your most recent med list.               acetaminophen 500 MG tablet    Commonly known as:  TYLENOL  Take 500 mg by mouth every 6 (six) hours as needed for pain.     budesonide-formoterol 80-4.5 MCG/ACT inhaler  Commonly known as:  SYMBICORT  Up to 2 pffs every 12 hours as needed     calcium carbonate 500 MG chewable tablet  Commonly known as:  TUMS - dosed in mg elemental calcium  Chew 1 tablet by mouth daily.     cetirizine 10 MG tablet  Commonly known as:  ZYRTEC  Take 10 mg by mouth daily as needed.     diphenhydrAMINE 12.5 MG/5ML liquid  Commonly known as:  BENADRYL  Take 25 mg by mouth at bedtime as needed for sleep.     etonogestrel-ethinyl estradiol 0.12-0.015 MG/24HR vaginal ring  Commonly known as:  [place] 1 each vaginally every 28 (twenty-eight) days. Insert vaginally and leave in place for 3 consecutive weeks, then remove for 1 week.     ibuprofen 600 MG tablet  Commonly known as:  ADVIL,MOTRIN  Take 1 tablet (600 mg total) by mouth every 6 (six) hours.     ranitidine 150 MG tablet  Commonly known as:  ZANTAC  One after breakfast and one after supper or bedtime  triamcinolone cream 0.1 %  Commonly known as:  KENALOG  Apply 1 application topically 2 (two) times daily.       ROS: Negative, with the exception of above mentioned in HPI  Objective:  BP 133/81 mmHg  Pulse 93  Temp(Src) 98.7 F (37.1 C)  Resp 20  Wt 240 lb 8 oz (109.09 kg)  SpO2 96%  LMP 12/04/2015 Body mass index is 41.26 kg/(m^2). Gen: Afebrile. No acute distress. Well developed, well nourished.  HENT: AT. [place]. Bilateral TM visualized, shiny/full. No erythema or bulging. MMM, no oral lesions. Bilateral nares with erythema and swelling. Throat without erythema or exudates. Cobblestoning present. Mild cough on exam. No hoarseness. TTP bilateral max sinus.  Eyes:Pupils Equal Round Reactive to light, Extraocular movements intact,  Conjunctiva without redness, discharge or icterus. Neck/lymp/endocrine: Supple,left ant cervical lymphadenopathy with  tenderness CV: RRR  Chest: CTAB, no wheeze or crackles. Good air movement, normal resp effort.  Abd: Soft.  NTND. BS present Skin: No rashes, purpura or petechiae.  Neuro: Normal gait. PERLA. EOMi. Alert. Oriented x3   Assessment/Plan: Stephanie Long is a 40 y.o. female present for acute OV for 1. Fever, unspecified - POCT Influenza A/B--> negative   2. Acute maxillary sinusitis, recurrence not specified - flonase, mucinex, augmentin, nasal saline  - amoxicillin-clavulanate (AUGMENTIN) 875-125 MG tablet; Take 1 tablet by mouth 2 (two) times daily.  Dispense: 20 tablet; Refill: 0 - F/U PRN    Howard Pouch, DO  [facility]

## 2015-12-15 NOTE — Patient Instructions (Signed)
Augmentin, floanse, mucinex., nasal saline. Tylenol/motrin for fever/aches.   Sinusitis, Adult Sinusitis is redness, soreness, and inflammation of the paranasal sinuses. Paranasal sinuses are air pockets within the bones of your face. They are located beneath your eyes, in the middle of your forehead, and above your eyes. In healthy paranasal sinuses, mucus is able to drain out, and air is able to circulate through them by way of your nose. However, when your paranasal sinuses are inflamed, mucus and air can become trapped. This can allow bacteria and other germs to grow and cause infection. Sinusitis can develop quickly and last only a short time (acute) or continue over a long period (chronic). Sinusitis that lasts for more than 12 weeks is considered chronic. CAUSES Causes of sinusitis include:  Allergies.  Structural abnormalities, such as displacement of the cartilage that separates your nostrils (deviated septum), which can decrease the air flow through your nose and sinuses and affect sinus drainage.  Functional abnormalities, such as when the small hairs (cilia) that line your sinuses and help remove mucus do not work properly or are not present. SIGNS AND SYMPTOMS Symptoms of acute and chronic sinusitis are the same. The primary symptoms are pain and pressure around the affected sinuses. Other symptoms include:  Upper toothache.  Earache.  Headache.  Bad breath.  Decreased sense of smell and taste.  A cough, which worsens when you are lying flat.  Fatigue.  Fever.  Thick drainage from your nose, which often is green and may contain pus (purulent).  Swelling and warmth over the affected sinuses. DIAGNOSIS Your health care provider will perform a physical exam. During your exam, your health care provider may perform any of the following to help determine if you have acute sinusitis or chronic sinusitis:  Look in your nose for signs of abnormal growths in your nostrils  (nasal polyps).  Tap over the affected sinus to check for signs of infection.  View the inside of your sinuses using an imaging device that has a light attached (endoscope). If your health care provider suspects that you have chronic sinusitis, one or more of the following tests may be recommended:  Allergy tests.  Nasal culture. A sample of mucus is taken from your nose, sent to a lab, and screened for bacteria.  Nasal cytology. A sample of mucus is taken from your nose and examined by your health care provider to determine if your sinusitis is related to an allergy. TREATMENT Most cases of acute sinusitis are related to a viral infection and will resolve on their own within 10 days. Sometimes, medicines are prescribed to help relieve symptoms of both acute and chronic sinusitis. These may include pain medicines, decongestants, nasal steroid sprays, or saline sprays. However, for sinusitis related to a bacterial infection, your health care provider will prescribe antibiotic medicines. These are medicines that will help kill the bacteria causing the infection. Rarely, sinusitis is caused by a fungal infection. In these cases, your health care provider will prescribe antifungal medicine. For some cases of chronic sinusitis, surgery is needed. Generally, these are cases in which sinusitis recurs more than 3 times per year, despite other treatments. HOME CARE INSTRUCTIONS  Drink plenty of water. Water helps thin the mucus so your sinuses can drain more easily.  Use a humidifier.  Inhale steam 3-4 times a day (for example, sit in the bathroom with the shower running).  Apply a warm, moist washcloth to your face 3-4 times a day, or as directed by your  health care provider.  Use saline nasal sprays to help moisten and clean your sinuses.  Take medicines only as directed by your health care provider.  If you were prescribed either an antibiotic or antifungal medicine, finish it all even if  you start to feel better. SEEK IMMEDIATE MEDICAL CARE IF:  You have increasing pain or severe headaches.  You have nausea, vomiting, or drowsiness.  You have swelling around your face.  You have vision problems.  You have a stiff neck.  You have difficulty breathing.   This information is not intended to replace advice given to you by your health care provider. Make sure you discuss any questions you have with your health care provider.   Document Released: 10/14/2005 Document Revised: 11/04/2014 Document Reviewed: 10/29/2011 Elsevier Interactive Patient Education [employer]

## 2016-01-16 MED FILL — NUVARING VAGINAL RING: 0.12-0.015 | 84 days supply | Qty: 3 | Fill #1

## 2016-05-01 MED FILL — NUVARING VAGINAL RING: 0.12-0.015 | 84 days supply | Qty: 3 | Fill #2

## 2016-08-12 DIAGNOSIS — Z1151 Encounter for screening for human papillomavirus (HPV): Secondary | ICD-10-CM | POA: Diagnosis not present

## 2016-08-12 DIAGNOSIS — Z01419 Encounter for gynecological examination (general) (routine) without abnormal findings: Secondary | ICD-10-CM | POA: Diagnosis not present

## 2016-08-12 DIAGNOSIS — Z1389 Encounter for screening for other disorder: Secondary | ICD-10-CM | POA: Diagnosis not present

## 2016-08-12 DIAGNOSIS — E282 Polycystic ovarian syndrome: Secondary | ICD-10-CM | POA: Diagnosis not present

## 2016-09-04 MED FILL — metFORMIN HCL 500 MG TABS: 500 | 30 days supply | Qty: 90 | Fill #0

## 2016-09-23 MED FILL — NUVARING VAGINAL RING: 0.12-0.015 | 84 days supply | Qty: 3 | Fill #0

## 2016-10-07 ENCOUNTER — Encounter: Payer: Self-pay | Admitting: Family Medicine

## 2016-10-07 ENCOUNTER — Ambulatory Visit (INDEPENDENT_AMBULATORY_CARE_PROVIDER_SITE_OTHER): Payer: 59 | Admitting: Family Medicine

## 2016-10-07 VITALS — BP 142/90 | HR 70 | Temp 98.3°F | Ht 64.0 in | Wt 255.4 lb

## 2016-10-07 DIAGNOSIS — R05 Cough: Secondary | ICD-10-CM | POA: Diagnosis not present

## 2016-10-07 DIAGNOSIS — R059 Cough, unspecified: Secondary | ICD-10-CM

## 2016-10-07 DIAGNOSIS — E669 Obesity, unspecified: Secondary | ICD-10-CM | POA: Insufficient documentation

## 2016-10-07 DIAGNOSIS — M79671 Pain in right foot: Secondary | ICD-10-CM | POA: Diagnosis not present

## 2016-10-07 DIAGNOSIS — J4531 Mild persistent asthma with (acute) exacerbation: Secondary | ICD-10-CM | POA: Diagnosis not present

## 2016-10-07 DIAGNOSIS — R0683 Snoring: Secondary | ICD-10-CM

## 2016-10-07 DIAGNOSIS — G8929 Other chronic pain: Secondary | ICD-10-CM

## 2016-10-07 DIAGNOSIS — M25512 Pain in left shoulder: Secondary | ICD-10-CM

## 2016-10-07 MED ORDER — ALBUTEROL SULFATE HFA 108 (90 BASE) MCG/ACT IN AERS: 2.0000 | INHALATION_SPRAY | Freq: Four times a day (QID) | RESPIRATORY_TRACT | 6 refills | Status: DC | PRN

## 2016-10-07 MED ORDER — BECLOMETHASONE DIPROPIONATE 40 MCG/ACT IN AERS: 2.0000 | INHALATION_SPRAY | Freq: Every day | RESPIRATORY_TRACT | 12 refills | Status: DC

## 2016-10-07 MED ORDER — BENZONATATE 100 MG PO CAPS: 100.0000 mg | ORAL_CAPSULE | Freq: Three times a day (TID) | ORAL | 0 refills | Status: DC | PRN

## 2016-10-07 MED FILL — VENTOLIN HFA 90 MCG INHALER: 108 (90 BAS | 25 days supply | Qty: 18 | Fill #0

## 2016-10-07 MED FILL — BENZONATATE 100 MG CAPSULE: 100 | 13 days supply | Qty: 40 | Fill #0

## 2016-10-07 MED FILL — QVAR 40 MCG ORAL INHALER: 40 | 30 days supply | Qty: 9 | Fill #0

## 2016-10-07 NOTE — Progress Notes (Signed)
Pre visit review using our clinic review tool, if applicable. No additional management support is needed unless otherwise documented below in the visit note. 

## 2016-10-07 NOTE — Patient Instructions (Addendum)
It was nice to meet you today!  We will refill your albuterol- use this as needed for wheezing I am also going to start you on Qvar (inhaled steroid) to use daily for the next 1-2 months. If this seems to improve your breathing overall you can continue to use it!    Start with one puff BID Use the tessalon as needed for cough   If this regimen does not help with your breathing please let me know You may have sleep apnea- I will have you see neurology and they will likely set up a sleep study for you.  If you do have OSA it is possible that a tonsillectomy will help!  We will also have you see Dr. Pearletha ForgeHudnall upstairs for your heel and shoulder pain.

## 2016-10-07 NOTE — Progress Notes (Signed)
[facility] [address] [place], [place] [address] [phone]  Date:  10/07/2016   Name:  Stephanie Long   DOB:  29-May-1976   MRN:  [id number]  PCP:  Abbe Amsterdam, MD    Chief Complaint: Establish Care (Pt here to est care. c/o cough and nasal congestion x 1-2 weeks. Pain in right foot, pain in both legs and left arm aching. )   History of Present Illness:  Stephanie Long is a 40 y.o. very pleasant female patient who presents with the following:  Here today to establish care- history of atopic derm and mild persistent asthma.  She is in search of a new PCP, she has seen several different doctors in recent years but this office is quite close to her home.   She has noted a "nagging cough" for about 2 weeks, she may wheeze on occasion She has not used any asthma medication in some time She has seen Dr. Sherene Sires- she reports that he advised her that her asthma was mild and that she likely did not need to stay on chronic symbicort, etc  She does not have albuterol right now. She has used some symbicort in the past but does not have any of this either Just this am she started to cough up some mucus.    She has not felt bad- no fever. She is often tired but she works nights. Her husband does report that she snores at night- she also notes that she has quite large tonsils and wonders about OSA She has a history of strep often in the past, which may also be related to her large tonsils  No ST now.  No vomiting or diarrhea   She had a history of diet controlled GDM-she then started on metformin per her OBG for PCOS/ pre-diabetes.  She did have an A1c that was 5.8% approx- it was under 6% per her report at GYN office  She is a never smoker.   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   She has a 36 yo daughter.  She is healthy and well.  She has noted some right heel pain for the  last several months- it can be painful on first arirsing, but always gets worse as the day goes on at work, etc.    She has also noted some pain in her left shoulder for the last couple of months- no known injury.    Patient Active Problem List   Diagnosis Date Noted  . Maxillary sinusitis, acute 12/15/2015  . URI (upper respiratory infection) 05/15/2015  . DERMATITIS, OTHER ATOPIC 03/24/2007  . Mild persistent asthma without complication 03/10/2007    Past Medical History:  Diagnosis Date  . Allergy   . Asthma   . Diabetes mellitus without complication (HCC)   . GERD (gastroesophageal reflux disease)   . Gestational diabetes   . Gestational hypertension 05/09/2013  . Hx of varicella   . PCOS (polycystic ovarian syndrome) 2012    Past Surgical History:  Procedure Laterality Date  . CESAREAN SECTION N/A 05/09/2013   Procedure: primary cesarean section with delivery of baby girl at 1800.  Apgars 9/9.;  Surgeon: Lenoard Aden, MD;  Location: [facility];  Service: Obstetrics;  Laterality: N/A;  . WISDOM TOOTH EXTRACTION    . WISDOM TOOTH EXTRACTION      Social History  Substance  Use Topics  . Smoking status: Never Smoker  . Smokeless tobacco: Never Used  . Alcohol use 1.1 oz/week    1 Glasses of wine, 1 Standard drinks or equivalent per week    Family History  Problem Relation Age of Onset  . Diabetes Father   . Heart disease Father   . Hyperlipidemia Father   . Heart attack Father   . Depression Mother   . Stroke Maternal Grandmother   . Cancer Neg Hx   . Asthma Neg Hx   . Early death Neg Hx     No Known Allergies  Medication list has been reviewed and updated.  Current Outpatient Prescriptions on File Prior to Visit  Medication Sig Dispense Refill  . acetaminophen (TYLENOL) 500 MG tablet Take 500 mg by mouth every 6 (six) hours as needed for pain.     Marland Kitchen. amoxicillin-clavulanate (AUGMENTIN) 875-125 MG tablet Take 1 tablet by mouth 2 (two) times daily. 20 tablet 0   . budesonide-formoterol (SYMBICORT) 80-4.5 MCG/ACT inhaler Up to 2 pffs every 12 hours as needed 1 Inhaler 11  . calcium carbonate (TUMS - DOSED IN MG ELEMENTAL CALCIUM) 500 MG chewable tablet Chew 1 tablet by mouth daily.    . cetirizine (ZYRTEC) 10 MG tablet Take 10 mg by mouth daily as needed.     . etonogestrel-ethinyl estradiol (NUVARING) 0.12-0.015 MG/24HR vaginal ring Place 1 each vaginally every 28 (twenty-eight) days. Insert vaginally and leave in place for 3 consecutive weeks, then remove for 1 week.    Marland Kitchen. ibuprofen (ADVIL,MOTRIN) 600 MG tablet Take 1 tablet (600 mg total) by mouth every 6 (six) hours. 30 tablet 1  . ranitidine (ZANTAC) 150 MG tablet One after breakfast and one after supper or bedtime 60 tablet 2   No current facility-administered medications on file prior to visit.     Review of Systems:  As per HPI- otherwise negative.   Physical Examination: Vitals:   10/07/16 0951 10/07/16 0956  BP: (!) 142/104 (!) 142/90  Pulse: 70   Temp: 98.3 F (36.8 C)    Vitals:   10/07/16 0951  Weight: 255 lb 6.4 oz (115.8 kg)  Height: 5\' 4"  (1.626 m)   Body mass index is 43.84 kg/m. Ideal Body Weight: Weight in (lb) to have BMI = 25: 145.3  GEN: WDWN, NAD, Non-toxic, A & O x 3, obese, otherwise looks well HEENT: Atraumatic, Normocephalic. Neck supple. No masses, No LAD.  Bilateral TM wnl, oropharynx shows large tonsils bilaterally.  PEERL,EOMI.   Ears and Nose: No external deformity. CV: RRR, No M/G/R. No JVD. No thrill. No extra heart sounds. PULM: minimal bilateral wheezing, no crackles, rhonchi. No retractions. No resp. distress. No accessory muscle use. ABD: S, NT, ND, +BS. No rebound. No HSM. EXTR: No c/c/e NEURO Normal gait.  PSYCH: Normally interactive. Conversant. Not depressed or anxious appearing.  Calm demeanor.  She has mild tenderness at the distal medial heel, c/w plantar fasciitis Left shoulder with normal ROM, but she does have tenderness over the RCT  attachement   Assessment and Plan: Mild persistent asthma with acute exacerbation - Plan: beclomethasone (QVAR) 40 MCG/ACT inhaler, albuterol (PROVENTIL HFA;VENTOLIN HFA) 108 (90 Base) MCG/ACT inhaler  Cough - Plan: benzonatate (TESSALON) 100 MG capsule  Snoring - Plan: Ambulatory referral to Neurology  Intractable right heel pain - Plan: Ambulatory referral to Sports Medicine  Chronic left shoulder pain - Plan: Ambulatory referral to Sports Medicine  Here today to establish care and discuss a couple  of concerns She is concerned about sleep apnea, and has large tonsils. Referral for sleep study  She has mild wheezing and no asthma medication Refilled her albuterol to use as needed.  Also would like to have her on an inhaled steroid for a month or two- hopefully following that she can come off the steroid and her sx will be quiet again She will keep me updated- can call in oral pred if needed Referral to sports med for her heel and shoulder issue   Signed Abbe AmsterdamJessica Copland, MD

## 2016-10-14 ENCOUNTER — Encounter: Payer: Self-pay | Admitting: Family Medicine

## 2016-10-14 ENCOUNTER — Ambulatory Visit (INDEPENDENT_AMBULATORY_CARE_PROVIDER_SITE_OTHER): Payer: 59 | Admitting: Family Medicine

## 2016-10-14 DIAGNOSIS — M722 Plantar fascial fibromatosis: Secondary | ICD-10-CM | POA: Diagnosis not present

## 2016-10-14 DIAGNOSIS — M25511 Pain in right shoulder: Secondary | ICD-10-CM | POA: Diagnosis not present

## 2016-10-14 NOTE — Patient Instructions (Addendum)
You have plantar fasciitis Take tylenol or aleve as needed for pain  Plantar fascia stretch for 20-30 seconds (do 3 of these) in morning Lowering/raise on a step exercises 3 x 10 once or twice a day - this is very important for long term recovery. Can add heel walks, toe walks forward and backward as well Ice heel for 15 minutes as needed. Avoid flat shoes/barefoot walking as much as possible. Arch straps have been shown to help with pain. Inserts are helpful (something like dr. Jari Sportsmanscholls active series, spencos). Steroid injection is a consideration for short term pain relief if you are struggling. Physical therapy is also an option.  You have rotator cuff impingement Try to avoid painful activities (overhead activities, lifting with extended arm) as much as possible. Aleve 2 tabs twice a day with food OR ibuprofen 3 tabs three times a day with food for pain and inflammation. Can take tylenol in addition to this. Subacromial injection may be beneficial to help with pain and to decrease inflammation. Consider physical therapy with transition to home exercise program. Do home exercise program with theraband and scapular stabilization exercises daily - these are very important for long term relief even if an injection was given.  3 sets of 10 once a day. If not improving at follow-up we will consider further imaging, injection, physical therapy, and/or nitro patches. Follow up in 6 weeks.

## 2016-10-16 DIAGNOSIS — M25511 Pain in right shoulder: Secondary | ICD-10-CM | POA: Insufficient documentation

## 2016-10-16 DIAGNOSIS — M722 Plantar fascial fibromatosis: Secondary | ICD-10-CM | POA: Insufficient documentation

## 2016-10-16 NOTE — Assessment & Plan Note (Signed)
2/2 rotator cuff impingement.  Shown home exercises to do daily.  Aleve or ibuprofen if needed.  Consider PT, injection, nitro patches if not improving.  F/u in 6 weeks.

## 2016-10-16 NOTE — Progress Notes (Signed)
PCP and consultation requested by: Abbe AmsterdamOPLAND,JESSICA, MD  Subjective:   HPI: Patient is a 40 y.o. female here for right foot pain, left arm pain.  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 On her feet a lot, pain plantar right heel bothers her especially at end of shifts. Pain is 2/10 at rest, up to 8/10 and sharp at that time. No swelling, skin changes. Also with 2 weeks of aching lateral left upper arm. Pain is 2/10, up to 4/10 with reaching and overhead motions. No injury or trauma - does a lot of transferring at work. No skin changes, numbness.  Past Medical History:  Diagnosis Date  . Allergy   . Asthma   . Diabetes mellitus without complication (HCC)   . GERD (gastroesophageal reflux disease)   . Gestational diabetes   . Gestational hypertension 05/09/2013  . Hx of varicella   . PCOS (polycystic ovarian syndrome) 2012    Current Outpatient Prescriptions on File Prior to Visit  Medication Sig Dispense Refill  . acetaminophen (TYLENOL) 500 MG tablet Take 500 mg by mouth every 6 (six) hours as needed for pain.     Marland Kitchen. albuterol (PROVENTIL HFA;VENTOLIN HFA) 108 (90 Base) MCG/ACT inhaler Inhale 2 puffs into the lungs every 6 (six) hours as needed for wheezing or shortness of breath. 1 Inhaler 6  . beclomethasone (QVAR) 40 MCG/ACT inhaler Inhale 2 puffs into the lungs daily. 1 Inhaler 12  . benzonatate (TESSALON) 100 MG capsule Take 1 capsule (100 mg total) by mouth 3 (three) times daily as needed for cough. 40 capsule 0  . calcium carbonate (TUMS - DOSED IN MG ELEMENTAL CALCIUM) 500 MG chewable tablet Chew 1 tablet by mouth daily.    . cetirizine (ZYRTEC) 10 MG tablet Take 10 mg by mouth daily as needed.     . etonogestrel-ethinyl estradiol (NUVARING) 0.12-0.015 MG/24HR vaginal ring Place 1 each vaginally every 28 (twenty-eight) days. Insert vaginally and leave in place for 3 consecutive weeks, then remove for 1 week.    . fluticasone (FLONASE) 50 MCG/ACT  nasal spray Place 1 spray into both nostrils as needed for allergies or rhinitis.    Marland Kitchen. ibuprofen (ADVIL,MOTRIN) 600 MG tablet Take 1 tablet (600 mg total) by mouth every 6 (six) hours. 30 tablet 1  . Melatonin 3 MG TABS Take 1 tablet by mouth at bedtime as needed.    . metFORMIN (GLUCOPHAGE) 500 MG tablet Take 500 mg by mouth 3 (three) times daily.    . Multiple Vitamin (MULTIVITAMIN) tablet Take 1 tablet by mouth daily.    . ranitidine (ZANTAC) 150 MG tablet One after breakfast and one after supper or bedtime (Patient taking differently: One after breakfast and one after supper or bedtime as needed) 60 tablet 2   No current facility-administered medications on file prior to visit.     Past Surgical History:  Procedure Laterality Date  . CESAREAN SECTION N/A 05/09/2013   Procedure: primary cesarean section with delivery of baby girl at 1800.  Apgars 9/9.;  Surgeon: Lenoard Adenichard J Taavon, MD;  Location: [facility];  Service: Obstetrics;  Laterality: N/A;  . WISDOM TOOTH EXTRACTION    . WISDOM TOOTH EXTRACTION      No Known Allergies  Social History   Social History  . Marital status: Married    Spouse name: N/A  . Number of children: N/A  . Years of education: N/A   Occupational History  . Not on file.  Social History Main Topics  . Smoking status: Never Smoker  . Smokeless tobacco: Never Used  . Alcohol use 1.1 oz/week    1 Glasses of wine, 1 Standard drinks or equivalent per week  . Drug use: No  . Sexual activity: Yes    Partners: Male   Other Topics Concern  . Not on file   Social History Narrative  . No narrative on file    Family History  Problem Relation Age of Onset  . Diabetes Father   . Heart disease Father   . Hyperlipidemia Father   . Heart attack Father   . Depression Mother   . Stroke Maternal Grandmother   . Cancer Neg Hx   . Asthma Neg Hx   . Early death Neg Hx     BP 139/89   Pulse 73   Ht 5\' 4"  (1.626 m)   Wt 255 lb (115.7 kg)   LMP 09/16/2016    BMI 43.77 kg/m   Review of Systems: See HPI above.     Objective:  Physical Exam:  Gen: NAD, comfortable in exam room  Right foot/ankle: No gross deformity, swelling, ecchymoses FROM TTP medial calcaneus at plantar fascia insertion. Negative ant drawer and talar tilt.   Negative syndesmotic compression. Negative calcaneal squeeze. Thompsons test negative. NV intact distally.  Left shoulder: No swelling, ecchymoses.  No gross deformity. No TTP. FROM. Negative Hawkins, positive Neers. Negative Speeds, Yergasons. Strength 5/5 with empty can and resisted internal/external rotation. Negative apprehension. NV intact distally.   Assessment & Plan:  1. Right plantar fasciitis - arch binders provided.  Stressed importance of arch support also, avoiding flat shoes and barefoot walking.  Shown home exercises and stretches to do daily.  Consider injection, custom orthotics, physical therapy if not improving.  2. Right shoulder pain - 2/2 rotator cuff impingement.  Shown home exercises to do daily.  Aleve or ibuprofen if needed.  Consider PT, injection, nitro patches if not improving.  F/u in 6 weeks.

## 2016-10-16 NOTE — Assessment & Plan Note (Signed)
arch binders provided.  Stressed importance of arch support also, avoiding flat shoes and barefoot walking.  Shown home exercises and stretches to do daily.  Consider injection, custom orthotics, physical therapy if not improving.

## 2016-10-31 ENCOUNTER — Ambulatory Visit (INDEPENDENT_AMBULATORY_CARE_PROVIDER_SITE_OTHER): Payer: 59 | Admitting: Neurology

## 2016-10-31 ENCOUNTER — Encounter: Payer: Self-pay | Admitting: Neurology

## 2016-10-31 VITALS — BP 126/78 | HR 78 | Resp 18 | Ht 64.0 in | Wt 252.0 lb

## 2016-10-31 DIAGNOSIS — G4726 Circadian rhythm sleep disorder, shift work type: Secondary | ICD-10-CM

## 2016-10-31 DIAGNOSIS — R351 Nocturia: Secondary | ICD-10-CM | POA: Diagnosis not present

## 2016-10-31 DIAGNOSIS — R0681 Apnea, not elsewhere classified: Secondary | ICD-10-CM | POA: Diagnosis not present

## 2016-10-31 DIAGNOSIS — R0683 Snoring: Secondary | ICD-10-CM | POA: Diagnosis not present

## 2016-10-31 DIAGNOSIS — R51 Headache: Secondary | ICD-10-CM | POA: Diagnosis not present

## 2016-10-31 DIAGNOSIS — R519 Headache, unspecified: Secondary | ICD-10-CM

## 2016-10-31 NOTE — Progress Notes (Signed)
Subjective:    Patient ID: Stephanie Long is a 41 y.o. female.  HPI     Huston Foley, MD, PhD [facility] [address] [address] [place], [place] [address]  Dear Dr. Patsy Lager,   I saw your patient, Stephanie Long, upon your kind request in my neurologic clinic today for initial consultation of her sleep disorder, in particular, concern for underlying obstructive sleep apnea. The patient is unaccompanied today. As you know, Ms. Corbit is a 41 year old right-handed woman with an underlying medical history of asthma, allergies, type 2 diabetes, reflux disease, PCOS and morbid obesity, who reports snoring and excessive daytime somnolence. She works nights as a Advertising account executive. I reviewed your office note from 10/07/2016. Her Epworth sleepiness score is 8 out of 24 today, her fatigue score is 40 out of 63. She lives at home with her husband and daughter. She works at [employer]. She is a nonsmoker, drinks 2 glasses of wine per week on average, denies illicit drug use and drinks 2 caffeine-containing beverages per day.  Her husband has reported loud snoring and abnormal breathing sounds, including pauses in her breathing. She has been having recurrent sore throat and her symptoms have been ongoing for about 3 months. She has been working in this job for about 2 years. She worked at [employer] before, also night. She has a 24 1/2 yo daughter, who is in preschool 3 days a week. She works 7 PM to 7 AM, usually Thu to Sat. she tries to be in bed by 9:30 AM and tries to sleep until 3 to 4 PM, at best to 5 PM. On nonwork days, she tries to sleep at night. She does notice that it takes 1-2 days for her to adjust to her nighttime sleep schedule. She wakes up with headaches at times and has nocturia once or twice per average night or during her daytime sleep. She denies frank restless leg symptoms or leg twitching at night. She is not aware of any family history of  OSA. She has gained weight, in the past few years she has gained about 50 pounds. She is trying to lose weight.  Her Past Medical History Is Significant For: Past Medical History:  Diagnosis Date  . Allergy   . Asthma   . Diabetes mellitus without complication (HCC)   . GERD (gastroesophageal reflux disease)   . Gestational diabetes   . Gestational hypertension 05/09/2013  . Hx of varicella   . PCOS (polycystic ovarian syndrome) 2012  . Plantar fasciitis     Her Past Surgical History Is Significant For: Past Surgical History:  Procedure Laterality Date  . CESAREAN SECTION N/A 05/09/2013   Procedure: primary cesarean section with delivery of baby girl at 1800.  Apgars 9/9.;  Surgeon: Lenoard Aden, MD;  Location: [facility];  Service: Obstetrics;  Laterality: N/A;  . WISDOM TOOTH EXTRACTION    . WISDOM TOOTH EXTRACTION      Her Family History Is Significant For: Family History  Problem Relation Age of Onset  . Diabetes Father   . Heart disease Father   . Hyperlipidemia Father   . Heart attack Father   . Depression Mother   . Stroke Maternal Grandmother   . Cancer Neg Hx   . Asthma Neg Hx   . Early death Neg Hx     Her Social History Is Significant For: Social History   Social History  . Marital status: Married  Spouse name: N/A  . Number of children: 1  . Years of education: BSN   Social History Main Topics  . Smoking status: Never Smoker  . Smokeless tobacco: Never Used  . Alcohol use 1.1 oz/week    1 Glasses of wine, 1 Standard drinks or equivalent per week  . Drug use: No  . Sexual activity: Yes    Partners: Male   Other Topics Concern  . None   Social History Narrative   Drinks 2 caffeine drinks a day     Her Allergies Are:  No Known Allergies:   Her Current Medications Are:  Outpatient Encounter Prescriptions as of 10/31/2016  Medication Sig  . acetaminophen (TYLENOL) 500 MG tablet Take 500 mg by mouth every 6 (six) hours as needed for pain.   Marland Kitchen.  albuterol (PROVENTIL HFA;VENTOLIN HFA) 108 (90 Base) MCG/ACT inhaler Inhale 2 puffs into the lungs every 6 (six) hours as needed for wheezing or shortness of breath.  . beclomethasone (QVAR) 40 MCG/ACT inhaler Inhale 2 puffs into the lungs daily.  . calcium carbonate (TUMS - DOSED IN MG ELEMENTAL CALCIUM) 500 MG chewable tablet Chew 1 tablet by mouth daily.  . cetirizine (ZYRTEC) 10 MG tablet Take 10 mg by mouth daily as needed.   . etonogestrel-ethinyl estradiol (NUVARING) 0.12-0.015 MG/24HR vaginal ring Place 1 each vaginally every 28 (twenty-eight) days. Insert vaginally and leave in place for 3 consecutive weeks, then remove for 1 week.  . fluticasone (FLONASE) 50 MCG/ACT nasal spray Place 1 spray into both nostrils as needed for allergies or rhinitis.  Marland Kitchen. ibuprofen (ADVIL,MOTRIN) 600 MG tablet Take 1 tablet (600 mg total) by mouth every 6 (six) hours.  . Melatonin 3 MG TABS Take 1 tablet by mouth at bedtime as needed.  . metFORMIN (GLUCOPHAGE) 500 MG tablet Take 500 mg by mouth 3 (three) times daily.  . Multiple Vitamin (MULTIVITAMIN) tablet Take 1 tablet by mouth daily.  . ranitidine (ZANTAC) 150 MG tablet One after breakfast and one after supper or bedtime (Patient taking differently: One after breakfast and one after supper or bedtime as needed)  . [DISCONTINUED] benzonatate (TESSALON) 100 MG capsule Take 1 capsule (100 mg total) by mouth 3 (three) times daily as needed for cough.   No facility-administered encounter medications on file as of 10/31/2016.   :  Review of Systems:  Out of a complete 14 point review of systems, all are reviewed and negative with the exception of these symptoms as listed below:  Review of Systems  Neurological:       Patient works night shift.  Wakes up during the night, snores, wakes up coughing, wakes up feeling tired, headaches, daytime fatigue   Epworth Sleepiness Scale 0= would never doze 1= slight chance of dozing 2= moderate chance of dozing 3= high  chance of dozing  Sitting and reading:2 Watching TV:2 Sitting inactive in a public place (ex. Theater or meeting):1 As a passenger in a car for an hour without a break:0 Lying down to rest in the afternoon:3 Sitting and talking to someone:0 Sitting quietly after lunch (no alcohol):0 In a car, while stopped in traffic:0 Total:8  Objective:  Neurologic Exam  Physical Exam Physical Examination:   Vitals:   10/31/16 1332  BP: 126/78  Pulse: 78  Resp: 18   General Examination: The patient is a very pleasant 41 y.o. female in no acute distress. She appears well-developed and well-nourished and well groomed.   HEENT: Normocephalic, atraumatic, pupils are equal, round  and reactive to light and accommodation. Funduscopic exam is normal with sharp disc margins noted. Extraocular tracking is good without limitation to gaze excursion or nystagmus noted. Normal smooth pursuit is noted. Hearing is grossly intact. Tympanic membranes are clear bilaterally. Face is symmetric with normal facial animation and normal facial sensation. Speech is clear with no dysarthria noted. There is no hypophonia. There is no lip, neck/head, jaw or voice tremor. Neck is supple with full range of passive and active motion. There are no carotid bruits on auscultation. Oropharynx exam reveals: mild mouth dryness, good dental hygiene and moderate airway crowding, due to larger tonsils of 2-3+, L more prominent than R. Mallampati is class II. Tongue protrudes centrally and palate elevates symmetrically. Neck size is 15 inches. She has a Mild overbite. Nasal inspection reveals no significant nasal mucosal bogginess or redness and no septal deviation, L side more wider open, overall smaller nasal passage, sounds congested.   Chest: Clear to auscultation without wheezing, rhonchi or crackles noted.  Heart: S1+S2+0, regular and normal without murmurs, rubs or gallops noted.   Abdomen: Soft, non-tender and non-distended with  normal bowel sounds appreciated on auscultation.  Extremities: There is no pitting edema in the distal lower extremities bilaterally. Pedal pulses are intact.  Skin: dry with eczematous changes noted over both knuckles. There are no varicose veins in the distal legs.  Musculoskeletal: exam reveals no obvious joint deformities, tenderness or joint swelling or erythema.   Neurologically:  Mental status: The patient is awake, alert and oriented in all 4 spheres. Her immediate and remote memory, attention, language skills and fund of knowledge are appropriate. There is no evidence of aphasia, agnosia, apraxia or anomia. Speech is clear with normal prosody and enunciation. Thought process is linear. Mood is normal and affect is normal.  Cranial nerves II - XII are as described above under HEENT exam. In addition: shoulder shrug is normal with equal shoulder height noted. Motor exam: Normal bulk, strength and tone is noted. There is no drift, tremor or rebound. Romberg is negative. Reflexes are 2+ throughout. Babinski: Toes are flexor bilaterally. Fine motor skills and coordination: intact with normal finger taps, normal hand movements, normal rapid alternating patting, normal foot taps and normal foot agility.  Cerebellar testing: No dysmetria or intention tremor on finger to nose testing. Heel to shin is unremarkable bilaterally. There is no truncal or gait ataxia.  Sensory exam: intact to light touch, pinprick, vibration, temperature sense in the upper and lower extremities.  Gait, station and balance: She stands easily. No veering to one side is noted. No leaning to one side is noted. Posture is age-appropriate and stance is narrow based. Gait shows normal stride length and normal pace. No problems turning are noted. Tandem walk is unremarkable.               Assessment and plan:  In summary, LEEANDRA ELLERSON is a very pleasant 41 y.o.-year old female with an underlying medical history of asthma,  allergies, type 2 diabetes, reflux disease, PCOS and morbid obesity, whose history and physical exam are concerning for obstructive sleep apnea (OSA). In addition, a confounding factor to her daytime somnolence and sleep disturbance is her shift work sleep disorder. She reports nocturia, waking up with headaches, weight gain and in light of her crowded airway and morbid obesity, sleep apnea should be treated. I had a long chat with the patient about my findings and the diagnosis of OSA, its prognosis and treatment options. We  talked about medical treatments, surgical interventions and non-pharmacological approaches. I explained in particular the risks and ramifications of untreated moderate to severe OSA, especially with respect to developing cardiovascular disease down the Road, including congestive heart failure, difficult to treat hypertension, cardiac arrhythmias, or stroke. Even type 2 diabetes has, in part, been linked to untreated OSA. Symptoms of untreated OSA include daytime sleepiness, memory problems, mood irritability and mood disorder such as depression and anxiety, lack of energy, as well as recurrent headaches, especially morning headaches. We talked about trying to maintain a healthy lifestyle in general, as well as the importance of weight control. I encouraged the patient to eat healthy, exercise daily and keep well hydrated, to keep a scheduled bedtime and wake time routine, to not skip any meals and eat healthy snacks in between meals. I advised the patient not to drive when feeling sleepy. I recommended the following at this time: sleep study with potential positive airway pressure titration. (We will score hypopneas at 3% and split the sleep study into diagnostic and treatment portion, if the estimated. 2 hour AHI is >15/h).   I explained the sleep test procedure to the patient and also outlined possible surgical and non-surgical treatment options of OSA, including the use of a custom-made  dental device (which would require a referral to a specialist dentist or oral surgeon), upper airway surgical options, such as pillar implants, radiofrequency surgery, tongue base surgery, and UPPP (which would involve a referral to an ENT surgeon). Rarely, jaw surgery such as mandibular advancement may be considered.  I also explained the CPAP treatment option to the patient, who indicated that she would be willing to try CPAP if the need arises. I explained the importance of being compliant with PAP treatment, not only for insurance purposes but primarily to improve Her symptoms, and for the patient's long term health benefit, including to reduce Her cardiovascular risks. I answered all her questions today and the patient was in agreement. I would like to see her back after the sleep study is completed and encouraged her to call with any interim questions, concerns, problems or updates.   Thank you very much for allowing me to participate in the care of this nice patient. If I can be of any further assistance to you please do not hesitate to call me at [phone].  Sincerely,   Huston Foley, MD, PhD

## 2016-10-31 NOTE — Patient Instructions (Signed)

## 2016-12-21 ENCOUNTER — Encounter (HOSPITAL_COMMUNITY): Payer: Self-pay | Admitting: Emergency Medicine

## 2016-12-21 ENCOUNTER — Ambulatory Visit (HOSPITAL_COMMUNITY)
Admission: EM | Admit: 2016-12-21 | Discharge: 2016-12-21 | Disposition: A | Discharge status: Home / Self Care | Payer: 59 | Attending: Family Medicine | Admitting: Family Medicine

## 2016-12-21 DIAGNOSIS — H10023 Other mucopurulent conjunctivitis, bilateral: Secondary | ICD-10-CM | POA: Diagnosis not present

## 2016-12-21 MED ORDER — TOBRAMYCIN 0.3 % OP SOLN: 1.0000 [drp] | OPHTHALMIC | 0 refills | Status: DC

## 2016-12-21 NOTE — ED Provider Notes (Signed)
[facility]    CSN: [id number] Arrival date & time: 12/21/16  1731     History   Chief Complaint Chief Complaint  Patient presents with  . Conjunctivitis    bilateral    HPI Stephanie Long is a 41 y.o. female.   Pt was having irritation in her eyes bilaterally yesterday.  She woke up today with both eyes stuck shut.  Pt is already suffering from an URI.      Past Medical History:  Diagnosis Date  . Allergy   . Asthma   . Diabetes mellitus without complication (HCC)   . GERD (gastroesophageal reflux disease)   . Gestational diabetes   . Gestational hypertension 05/09/2013  . Hx of varicella   . PCOS (polycystic ovarian syndrome) 2012  . Plantar fasciitis     Patient Active Problem List   Diagnosis Date Noted  . Plantar fasciitis of right foot 10/16/2016  . Right shoulder pain 10/16/2016  . Obesity 10/07/2016  . DERMATITIS, OTHER ATOPIC 03/24/2007  . Mild persistent asthma without complication 03/10/2007    Past Surgical History:  Procedure Laterality Date  . CESAREAN SECTION N/A 05/09/2013   Procedure: primary cesarean section with delivery of baby girl at 1800.  Apgars 9/9.;  Surgeon: Lenoard Aden, MD;  Location: [facility];  Service: Obstetrics;  Laterality: N/A;  . WISDOM TOOTH EXTRACTION    . WISDOM TOOTH EXTRACTION      OB History    Gravida Para Term Preterm AB Living   SAB TAB Ectopic Multiple Live Births           1       Home Medications    Prior to Admission medications   Medication Sig Start Date End Date Taking? Authorizing Provider  acetaminophen (TYLENOL) 500 MG tablet Take 500 mg by mouth every 6 (six) hours as needed for pain.    Yes Historical Provider, MD  albuterol (PROVENTIL HFA;VENTOLIN HFA) 108 (90 Base) MCG/ACT inhaler Inhale 2 puffs into the lungs every 6 (six) hours as needed for wheezing or shortness of breath. 10/07/16  Yes Gwenlyn Found Copland, MD  beclomethasone (QVAR) 40 MCG/ACT inhaler  Inhale 2 puffs into the lungs daily. 10/07/16  Yes Gwenlyn Found Copland, MD  calcium carbonate (TUMS - DOSED IN MG ELEMENTAL CALCIUM) 500 MG chewable tablet Chew 1 tablet by mouth daily.   Yes Historical Provider, MD  cetirizine (ZYRTEC) 10 MG tablet Take 10 mg by mouth daily as needed.    Yes Historical Provider, MD  etonogestrel-ethinyl estradiol (NUVARING) 0.12-0.015 MG/24HR vaginal ring Place 1 each vaginally every 28 (twenty-eight) days. Insert vaginally and leave in place for 3 consecutive weeks, then remove for 1 week.   Yes Maxie Better, MD  fluticasone (FLONASE) 50 MCG/ACT nasal spray Place 1 spray into both nostrils as needed for allergies or rhinitis.   Yes Historical Provider, MD  ibuprofen (ADVIL,MOTRIN) 600 MG tablet Take 1 tablet (600 mg total) by mouth every 6 (six) hours. 05/11/13  Yes Arlana Lindau, NP  Melatonin 3 MG TABS Take 1 tablet by mouth at bedtime as needed.   Yes Historical Provider, MD  metFORMIN (GLUCOPHAGE) 500 MG tablet Take 500 mg by mouth 3 (three) times daily.   Yes Historical Provider, MD  Multiple Vitamin (MULTIVITAMIN) tablet Take 1 tablet by mouth daily.   Yes Historical Provider, MD  ranitidine (ZANTAC) 150 MG tablet One after breakfast and one after  supper or bedtime Patient taking differently: One after breakfast and one after supper or bedtime as needed 01/19/14  Yes Nyoka Cowden, MD  tobramycin (TOBREX) 0.3 % ophthalmic solution Place 1 drop into both eyes every 4 (four) hours. 12/21/16   Elvina Sidle, MD    Family History Family History  Problem Relation Age of Onset  . Diabetes Father   . Heart disease Father   . Hyperlipidemia Father   . Heart attack Father   . Depression Mother   . Stroke Maternal Grandmother   . Cancer Neg Hx   . Asthma Neg Hx   . Early death Neg Hx     Social History Social History  Substance Use Topics  . Smoking status: Never Smoker  . Smokeless tobacco: Never Used  . Alcohol use 1.1 oz/week    1 Glasses of  wine, 1 Standard drinks or equivalent per week     Allergies   Patient has no known allergies.   Review of Systems Review of Systems  Constitutional: Negative.   HENT: Positive for congestion.   Eyes: Positive for discharge, redness and itching.  Respiratory: Positive for cough.      Physical Exam Triage Vital Signs ED Triage Vitals  Enc Vitals Group     BP 12/21/16 1741 139/72     Pulse Rate 12/21/16 1741 67     Resp --      Temp 12/21/16 1741 98.3 F (36.8 C)     Temp Source 12/21/16 1741 Oral     SpO2 12/21/16 1741 98 %     Weight --      Height --      Head Circumference --      Peak Flow --      Pain Score 12/21/16 1740 1     Pain Loc --      Pain Edu? --      Excl. in GC? --    No data found.   Updated Vital Signs BP 139/72 (BP Location: Left Wrist)   Pulse 67   Temp 98.3 F (36.8 C) (Oral)   LMP 11/22/2016 (Approximate)   SpO2 98%   Physical Exam  Constitutional: She is oriented to person, place, and time. She appears well-developed and well-nourished. No distress.  HENT:  Head: Normocephalic.  Right Ear: External ear normal.  Left Ear: External ear normal.  Mouth/Throat: Oropharynx is clear and moist.  Eyes: EOM are normal. Pupils are equal, round, and reactive to light. Right eye exhibits discharge. Left eye exhibits discharge.  Neck: Normal range of motion. Neck supple.  Musculoskeletal: Normal range of motion.  Neurological: She is alert and oriented to person, place, and time.  Skin: Skin is warm and dry.  Nursing note and vitals reviewed.    UC Treatments / Results  Labs (all labs ordered are listed, but only abnormal results are displayed) Labs Reviewed - No data to display  EKG  EKG Interpretation None       Radiology No results found.  Procedures Procedures (including critical care time)  Medications Ordered in UC Medications - No data to display   Initial Impression / Assessment and Plan / UC Course  I have  reviewed the triage vital signs and the nursing notes.  Pertinent labs & imaging results that were available during my care of the patient were reviewed by me and considered in my medical decision making (see chart for details).      Final Clinical Impressions(s) / UC  Diagnoses   Final diagnoses:  Other mucopurulent conjunctivitis of both eyes    New Prescriptions New Prescriptions   TOBRAMYCIN (TOBREX) 0.3 % OPHTHALMIC SOLUTION    Place 1 drop into both eyes every 4 (four) hours.     Elvina Sidle, MD 12/21/16 [phone]

## 2016-12-21 NOTE — ED Triage Notes (Signed)
Pt was having irritation in her eyes bilaterally yesterday.  She woke up today with both eyes stuck shut.  Pt is already suffering from an URI.

## 2017-01-01 MED FILL — NUVARING VAGINAL RING: 0.12-0.015 | 84 days supply | Qty: 3 | Fill #1

## 2017-01-01 MED FILL — metFORMIN HCL 500 MG TABS: 500 | 30 days supply | Qty: 90 | Fill #1

## 2017-02-24 ENCOUNTER — Other Ambulatory Visit: Payer: Self-pay

## 2017-02-24 DIAGNOSIS — J4531 Mild persistent asthma with (acute) exacerbation: Secondary | ICD-10-CM

## 2017-02-24 MED ORDER — BECLOMETHASONE DIPROPIONATE 40 MCG/ACT IN AERS: 2.0000 | INHALATION_SPRAY | Freq: Every day | RESPIRATORY_TRACT | 12 refills | Status: DC

## 2017-02-24 MED FILL — QVAR REDIHALER 40 MCG/ACT A: 40 | 60 days supply | Qty: 11 | Fill #0

## 2017-05-07 MED FILL — NUVARING VAGINAL RING: 0.12-0.015 | 84 days supply | Qty: 3 | Fill #2

## 2017-09-09 DIAGNOSIS — E282 Polycystic ovarian syndrome: Secondary | ICD-10-CM | POA: Diagnosis not present

## 2017-09-09 DIAGNOSIS — Z6841 Body Mass Index (BMI) 40.0 and over, adult: Secondary | ICD-10-CM | POA: Diagnosis not present

## 2017-09-09 DIAGNOSIS — Z01419 Encounter for gynecological examination (general) (routine) without abnormal findings: Secondary | ICD-10-CM | POA: Diagnosis not present

## 2017-10-08 ENCOUNTER — Ambulatory Visit (INDEPENDENT_AMBULATORY_CARE_PROVIDER_SITE_OTHER): Payer: 59 | Admitting: Family Medicine

## 2017-10-08 ENCOUNTER — Encounter: Payer: Self-pay | Admitting: Family Medicine

## 2017-10-08 DIAGNOSIS — M79671 Pain in right foot: Secondary | ICD-10-CM | POA: Diagnosis not present

## 2017-10-08 DIAGNOSIS — M25561 Pain in right knee: Secondary | ICD-10-CM

## 2017-10-08 NOTE — Patient Instructions (Signed)
You have a stress fracture of your 2nd metatarsal. Wear the boot when up and walking around. Ice this 15 minutes at a time 3-4 times a day. Tylenol, ibuprofen only if needed for pain. Elevate above your heart when needed for swelling. Calcium 1300mg  daily and vitamin D 800 IU daily. We will consider a bone density test in the future. Follow up with me in 3-4 weeks.  Your knee pain is due to mild arthritis. These are the different medications you can take for this: Tylenol 500mg  1-2 tabs three times a day for pain. Capsaicin, aspercreme, or biofreeze topically up to four times a day may also help with pain. Some supplements that may help for arthritis: Boswellia extract, curcumin, pycnogenol Aleve 1-2 tabs twice a day with food Cortisone injections are an option. If cortisone injections do not help, there are different types of shots that may help but they take longer to take effect. It's important that you continue to stay active. Straight leg raises, knee extensions 3 sets of 10 once a day (add ankle weight if these become too easy). Consider physical therapy to strengthen muscles around the joint that hurts to take pressure off of the joint itself. Shoe inserts with good arch support may be helpful. Heat or ice 15 minutes at a time 3-4 times a day as needed to help with pain. Water aerobics and cycling with low resistance are the best two types of exercise for arthritis though any exercise is ok as long as it doesn't worsen the pain.

## 2017-10-09 ENCOUNTER — Encounter: Payer: Self-pay | Admitting: Family Medicine

## 2017-10-09 DIAGNOSIS — M79671 Pain in right foot: Secondary | ICD-10-CM | POA: Insufficient documentation

## 2017-10-09 DIAGNOSIS — M25561 Pain in right knee: Secondary | ICD-10-CM | POA: Insufficient documentation

## 2017-10-09 NOTE — Assessment & Plan Note (Signed)
2/2 mild medial arthritis.  Tylenol, topical medications, supplements, aleve reviewed.  Consider injection if becomes severe.  Arch supports, heat/ice.  Home exercises.

## 2017-10-09 NOTE — Progress Notes (Signed)
PCP: Copland, Gwenlyn FoundJessica C, MD  Subjective:   HPI: Patient is a 41 y.o. female here for right foot, knee pain.  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 Pain is 4/10 but up to 7-8/10 and sharp at work. Has been icing. Tried wrapping this. No history of stress fracture or osteoporosis. Also with medial right knee pain since this started, medial. Can feel like knee wants to give out. No skin changes, numbness.  Past Medical History:  Diagnosis Date  . Allergy   . Asthma   . Diabetes mellitus without complication (HCC)   . GERD (gastroesophageal reflux disease)   . Gestational diabetes   . Gestational hypertension 05/09/2013  . Hx of varicella   . PCOS (polycystic ovarian syndrome) 2012  . Plantar fasciitis     Current Outpatient Medications on File Prior to Visit  Medication Sig Dispense Refill  . albuterol (PROVENTIL HFA;VENTOLIN HFA) 108 (90 Base) MCG/ACT inhaler Inhale 2 puffs into the lungs every 6 (six) hours as needed for wheezing or shortness of breath. 1 Inhaler 6  . beclomethasone (QVAR) 40 MCG/ACT inhaler Inhale 2 puffs into the lungs daily. 1 Inhaler 12  . calcium carbonate (TUMS - DOSED IN MG ELEMENTAL CALCIUM) 500 MG chewable tablet Chew 1 tablet by mouth daily.    . cetirizine (ZYRTEC) 10 MG tablet Take 10 mg by mouth daily as needed.     . etonogestrel-ethinyl estradiol (NUVARING) 0.12-0.015 MG/24HR vaginal ring Place 1 each vaginally every 28 (twenty-eight) days. Insert vaginally and leave in place for 3 consecutive weeks, then remove for 1 week.    . fluticasone (FLONASE) 50 MCG/ACT nasal spray Place 1 spray into both nostrils as needed for allergies or rhinitis.    . Melatonin 3 MG TABS Take 1 tablet by mouth at bedtime as needed.    . metFORMIN (GLUCOPHAGE) 500 MG tablet Take 500 mg by mouth 3 (three) times daily.    . Multiple Vitamin (MULTIVITAMIN) tablet Take 1 tablet by  mouth daily.    . ranitidine (ZANTAC) 150 MG tablet One after breakfast and one after supper or bedtime (Patient taking differently: One after breakfast and one after supper or bedtime as needed) 60 tablet 2   No current facility-administered medications on file prior to visit.     Past Surgical History:  Procedure Laterality Date  . CESAREAN SECTION N/A 05/09/2013   Procedure: primary cesarean section with delivery of baby girl at 1800.  Apgars 9/9.;  Surgeon: Lenoard Adenichard J Taavon, MD;  Location: [facility];  Service: Obstetrics;  Laterality: N/A;  . WISDOM TOOTH EXTRACTION    . WISDOM TOOTH EXTRACTION      No Known Allergies  Social History   Socioeconomic History  . Marital status: Married    Spouse name: Not on file  . Number of children: 1  . Years of education: BSN  . Highest education level: Not on file  Social Needs  . Financial resource strain: Not on file  . Food insecurity - worry: Not on file  . Food insecurity - inability: Not on file  . Transportation needs - medical: Not on file  . Transportation needs - non-medical: Not on file  Occupational History  . Not on file  Tobacco Use  . Smoking status: Never Smoker  . Smokeless tobacco: Never Used  Substance and Sexual Activity  . Alcohol use: Yes    Alcohol/week: 1.1  oz    Types: 1 Glasses of wine, 1 Standard drinks or equivalent per week  . Drug use: No  . Sexual activity: Yes    Partners: Male  Other Topics Concern  . Not on file  Social History Narrative   Drinks 2 caffeine drinks a day     Family History  Problem Relation Age of Onset  . Diabetes Father   . Heart disease Father   . Hyperlipidemia Father   . Heart attack Father   . Depression Mother   . Stroke Maternal Grandmother   . Cancer Neg Hx   . Asthma Neg Hx   . Early death Neg Hx     BP (!) 131/98   Pulse 73   Ht 5\' 4"  (1.626 m)   Wt 250 lb (113.4 kg)   BMI 42.91 kg/m   Review of Systems: See HPI above.     Objective:  Physical  Exam:  Gen: NAD, comfortable in exam room  Right foot/ankle: Mild dorsal swelling.  No bruising, other deformity. FROM with 5/5 strength. TTP 2nd metatarsal distally dorsal foot.  Less tenderness 3rd metatarsal.  No other tenderness. Negative ant drawer and talar tilt.   Negative syndesmotic compression. Thompsons test negative. NV intact distally.  Left foot/ankle: No gross deformity, swelling, ecchymoses FROM with 5/5 strength. No TTP NV intact distally.  Right knee: No gross deformity, ecchymoses, swelling. Minimal TTP medial joint line.  No other tenderness. FROM. Negative ant/post drawers. Negative valgus/varus testing. Negative lachmanns. Negative mcmurrays, apleys, patellar apprehension. NV intact distally.   MSK u/s right 2nd metatarsal - edema and neovascularity overlying distal 2nd metatarsal.  No cortical irregularity.  Images saved.  Assessment & Plan:  1. Right foot pain - 2/2 2nd metatarsal stress fracture.  Cam walker.  Icing, tylenol or ibuprofen.  Calcium and vitamin D.  Consider bone density test in the future.  F/u in 3-4 weeks.  2. Right knee pain - 2/2 mild medial arthritis.  Tylenol, topical medications, supplements, aleve reviewed.  Consider injection if becomes severe.  Arch supports, heat/ice.  Home exercises.

## 2017-10-09 NOTE — Assessment & Plan Note (Signed)
2/2 2nd metatarsal stress fracture.  Cam walker.  Icing, tylenol or ibuprofen.  Calcium and vitamin D.  Consider bone density test in the future.  F/u in 3-4 weeks.

## 2017-10-13 ENCOUNTER — Telehealth: Payer: Self-pay | Admitting: Family Medicine

## 2017-10-13 NOTE — Telephone Encounter (Signed)
Has had a lot of pain and swelling and the boot is not working out at work.  She was told to call you if she needed a note for work.  She needs a note for work and to know what else she can do.

## 2017-10-13 NOTE — Telephone Encounter (Signed)
Letter printed. Thanks!

## 2017-10-13 NOTE — Telephone Encounter (Signed)
Spoke to patient and she would like the letter to be for 2 weeks.

## 2017-10-13 NOTE — Telephone Encounter (Signed)
If pain is severe enough sometimes patients will start using crutches or a knee scooter too for a couple weeks to get through the most painful phase.  I would recommend up to 2 weeks out of work - how long would she like me to write it for?

## 2017-11-05 MED FILL — NUVARING VAGINAL RING: 0.12-0.015 | 84 days supply | Qty: 3 | Fill #0

## 2017-11-10 ENCOUNTER — Ambulatory Visit (HOSPITAL_BASED_OUTPATIENT_CLINIC_OR_DEPARTMENT_OTHER)
Admission: RE | Admit: 2017-11-10 | Discharge: 2017-11-10 | Disposition: A | Discharge status: Home / Self Care | Payer: No Typology Code available for payment source | Source: Ambulatory Visit | Attending: Family Medicine | Admitting: Family Medicine

## 2017-11-10 ENCOUNTER — Ambulatory Visit: Payer: No Typology Code available for payment source | Admitting: Family Medicine

## 2017-11-10 ENCOUNTER — Encounter: Payer: Self-pay | Admitting: Family Medicine

## 2017-11-10 VITALS — BP 126/89 | HR 84 | Ht 64.0 in | Wt 250.0 lb

## 2017-11-10 DIAGNOSIS — M8580 Other specified disorders of bone density and structure, unspecified site: Secondary | ICD-10-CM | POA: Insufficient documentation

## 2017-11-10 DIAGNOSIS — M79671 Pain in right foot: Secondary | ICD-10-CM

## 2017-11-10 NOTE — Patient Instructions (Signed)
You're healing as expected from your stress fracture. Wear the boot for 2 more weeks then transition into a supportive shoe with inserts as we discussed. Icing, tylenol, ibuprofen only if needed. Continue calcium and vitamin D indefinitely. Elevate above your heart when needed for swelling. We will set up a bone density test to assess for osteoporosis. Follow up with me as needed if you're doing well.

## 2017-11-11 ENCOUNTER — Encounter: Payer: Self-pay | Admitting: Family Medicine

## 2017-11-11 NOTE — Assessment & Plan Note (Signed)
2/2 2nd metatarsal stress fracture, clinically improving and healing by ultrasound.  Cam walker for 2 more weeks then transition to supportive shoe with inserts.  Icing, tylenol, ibuprofen if needed.  Continue calcium and vitamin D.  Ordered DEXA and no osteopenia or osteoporosis.  F/u prn if doing well.

## 2017-11-11 NOTE — Progress Notes (Signed)
PCP: Copland, Gwenlyn Found, MD  Subjective:   HPI: Patient is a 42 y.o. female here for right foot, knee pain.  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 Pain is 4/10 but up to 7-8/10 and sharp at work. Has been icing. Tried wrapping this. No history of stress fracture or osteoporosis. Also with medial right knee pain since this started, medial. Can feel like knee wants to give out. No skin changes, numbness.  11/10/17: Patient reports she is doing a lot better since last visit. Pain level down to 1/10 and a soreness. Gets a twinge at times. Wearing boot - rested first 2 weeks. Back at work for full shifts. A little swelling in the morning. Takes ibuprofen if needed. No skin changes, numbness.  Past Medical History:  Diagnosis Date  . Allergy   . Asthma   . Diabetes mellitus without complication (HCC)   . GERD (gastroesophageal reflux disease)   . Gestational diabetes   . Gestational hypertension 05/09/2013  . Hx of varicella   . PCOS (polycystic ovarian syndrome) 2012  . Plantar fasciitis     Current Outpatient Medications on File Prior to Visit  Medication Sig Dispense Refill  . albuterol (PROVENTIL HFA;VENTOLIN HFA) 108 (90 Base) MCG/ACT inhaler Inhale 2 puffs into the lungs every 6 (six) hours as needed for wheezing or shortness of breath. 1 Inhaler 6  . beclomethasone (QVAR) 40 MCG/ACT inhaler Inhale 2 puffs into the lungs daily. 1 Inhaler 12  . calcium carbonate (TUMS - DOSED IN MG ELEMENTAL CALCIUM) 500 MG chewable tablet Chew 1 tablet by mouth daily.    . cetirizine (ZYRTEC) 10 MG tablet Take 10 mg by mouth daily as needed.     . etonogestrel-ethinyl estradiol (NUVARING) 0.12-0.015 MG/24HR vaginal ring Place 1 each vaginally every 28 (twenty-eight) days. Insert vaginally and leave in place for 3 consecutive weeks, then remove for 1 week.    . fluticasone (FLONASE) 50  MCG/ACT nasal spray Place 1 spray into both nostrils as needed for allergies or rhinitis.    . Melatonin 3 MG TABS Take 1 tablet by mouth at bedtime as needed.    . metFORMIN (GLUCOPHAGE) 500 MG tablet Take 500 mg by mouth 3 (three) times daily.    . Multiple Vitamin (MULTIVITAMIN) tablet Take 1 tablet by mouth daily.    . ranitidine (ZANTAC) 150 MG tablet One after breakfast and one after supper or bedtime (Patient taking differently: One after breakfast and one after supper or bedtime as needed) 60 tablet 2   No current facility-administered medications on file prior to visit.     Past Surgical History:  Procedure Laterality Date  . CESAREAN SECTION N/A 05/09/2013   Procedure: primary cesarean section with delivery of baby girl at 1800.  Apgars 9/9.;  Surgeon: Lenoard Aden, MD;  Location: [facility];  Service: Obstetrics;  Laterality: N/A;  . WISDOM TOOTH EXTRACTION    . WISDOM TOOTH EXTRACTION      No Known Allergies  Social History   Socioeconomic History  . Marital status: Married    Spouse name: Not on file  . Number of children: 1  . Years of education: BSN  . Highest education level: Not on file  Social Needs  . Financial resource strain: Not on file  . Food insecurity - worry: Not on file  . Food insecurity - inability: Not on file  .  Transportation needs - medical: Not on file  . Transportation needs - non-medical: Not on file  Occupational History  . Not on file  Tobacco Use  . Smoking status: Never Smoker  . Smokeless tobacco: Never Used  Substance and Sexual Activity  . Alcohol use: Yes    Alcohol/week: 1.1 oz    Types: 1 Glasses of wine, 1 Standard drinks or equivalent per week  . Drug use: No  . Sexual activity: Yes    Partners: Male  Other Topics Concern  . Not on file  Social History Narrative   Drinks 2 caffeine drinks a day     Family History  Problem Relation Age of Onset  . Diabetes Father   . Heart disease Father   . Hyperlipidemia Father    . Heart attack Father   . Depression Mother   . Stroke Maternal Grandmother   . Cancer Neg Hx   . Asthma Neg Hx   . Early death Neg Hx     BP 126/89   Pulse 84   Ht 5\' 4"  (1.626 m)   Wt 250 lb (113.4 kg)   BMI 42.91 kg/m   Review of Systems: See HPI above.     Objective:  Physical Exam:  Gen: NAD, comfortable in exam room.  Right foot/ankle: Minimal dorsal foot swelling distally.  No other deformity, ecchymoses FROM with 5/5 strength ankle motions, digits. No TTP. Negative ant drawer and talar tilt.   Negative syndesmotic compression. Negative metatarsal squeeze. Thompsons test negative. NV intact distally.  MSK u/s right 2nd metatarsal: callus noted distal 2nd metatarsal, a little neovascularity without edema now.  Assessment & Plan:  1. Right foot pain - 2/2 2nd metatarsal stress fracture, clinically improving and healing by ultrasound.  Cam walker for 2 more weeks then transition to supportive shoe with inserts.  Icing, tylenol, ibuprofen if needed.  Continue calcium and vitamin D.  Ordered DEXA and no osteopenia or osteoporosis.  F/u prn if doing well.

## 2018-02-28 NOTE — Progress Notes (Addendum)
[facility] [address] [place], [place] [address] [phone]  Date:  03/02/2018   Name:  Stephanie Long   DOB:  1975/11/30   MRN:  [id number]  PCP:  Pearline Cables, MD    Chief Complaint: Annual Exam (prediabetes? no pap)   History of Present Illness:  Stephanie Long is a 42 y.o. very pleasant female patient who presents with the following:  [employer] employee here today for a CPE History of obesity and mild asthma Last visit with me 12/17: She is a never smoker.   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   She works 3 nights a week.   She has a 51 yo daughter.  She is healthy and well.  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    Labs:  Will do today- she last ate just a few minutes ago but not a large meal  Pap: per her GYN Mammo: done 2-3 years ago, normal  Immunizations:  UTD through [employer]   She has tried to do metformin for pre-diabetes but it did not agree with her (per her GYN) She is working on her diet and exercise-  Her GYN did suggest that she try Saxenda for weight loss.  However she was not sure about this - would like to check on her labs first and then think about it  She does use prn meds for reflux. States that she uses ranitidine on occasion as needed.  However, she then also notes that she will sometimes have CP which she thought was due to reflux but perhaps not.    She has noted substernal chest pain 1-2x a month for the last 2 months. It will last 2-3 minutes It can occur any time, does not seem to be related to activity.  Tends to be worse at the end of her 12 hour overnights shifts at work She may take tums, or may take a few deep breaths and it will resolve usually In fact, she is having CP right now  She does not have any nausea, jaw pain  Her father did  have an MI at age 79  Patient Active Problem List   Diagnosis Date Noted  . Right knee pain 10/09/2017  . Right foot pain 10/09/2017  . Plantar fasciitis of right foot 10/16/2016  . Right shoulder pain 10/16/2016  . Obesity 10/07/2016  . DERMATITIS, OTHER ATOPIC 03/24/2007  . Mild persistent asthma without complication 03/10/2007    Past Medical History:  Diagnosis Date  . Allergy   . Asthma   . Diabetes mellitus without complication (HCC)   . GERD (gastroesophageal reflux disease)   . Gestational diabetes   . Gestational hypertension 05/09/2013  . Hx of varicella   . PCOS (polycystic ovarian syndrome) 2012  . Plantar fasciitis     Past Surgical History:  Procedure Laterality Date  . CESAREAN SECTION N/A 05/09/2013   Procedure: primary cesarean section with delivery of baby girl at 1800.  Apgars 9/9.;  Surgeon: Lenoard Aden, MD;  Location: [facility];  Service: Obstetrics;  Laterality: N/A;  . WISDOM TOOTH EXTRACTION    . WISDOM TOOTH EXTRACTION      Social History   Tobacco Use  . Smoking status: Never Smoker  . Smokeless  tobacco: Never Used  Substance Use Topics  . Alcohol use: Yes    Alcohol/week: 1.1 oz    Types: 1 Glasses of wine, 1 Standard drinks or equivalent per week  . Drug use: No    Family History  Problem Relation Age of Onset  . Diabetes Father   . Heart disease Father   . Hyperlipidemia Father   . Heart attack Father   . Depression Mother   . Stroke Maternal Grandmother   . Cancer Neg Hx   . Asthma Neg Hx   . Early death Neg Hx     No Known Allergies  Medication list has been reviewed and updated.  Current Outpatient Medications on File Prior to Visit  Medication Sig Dispense Refill  . albuterol (PROVENTIL HFA;VENTOLIN HFA) 108 (90 Base) MCG/ACT inhaler Inhale 2 puffs into the lungs every 6 (six) hours as needed for wheezing or shortness of breath. 1 Inhaler 6  . beclomethasone (QVAR) 40 MCG/ACT inhaler Inhale 2 puffs into the lungs  daily. 1 Inhaler 12  . calcium carbonate (TUMS - DOSED IN MG ELEMENTAL CALCIUM) 500 MG chewable tablet Chew 1 tablet by mouth daily.    . cetirizine (ZYRTEC) 10 MG tablet Take 10 mg by mouth daily as needed.     . etonogestrel-ethinyl estradiol (NUVARING) 0.12-0.015 MG/24HR vaginal ring Place 1 each vaginally every 28 (twenty-eight) days. Insert vaginally and leave in place for 3 consecutive weeks, then remove for 1 week.    . fluticasone (FLONASE) 50 MCG/ACT nasal spray Place 1 spray into both nostrils as needed for allergies or rhinitis.    . Melatonin 3 MG TABS Take 1 tablet by mouth at bedtime as needed.    . Multiple Vitamin (MULTIVITAMIN) tablet Take 1 tablet by mouth daily.    . ranitidine (ZANTAC) 150 MG tablet One after breakfast and one after supper or bedtime (Patient taking differently: One after breakfast and one after supper or bedtime as needed) 60 tablet 2   No current facility-administered medications on file prior to visit.     Review of Systems:  As per HPI- otherwise negative.   Physical Examination: Vitals:   03/02/18 1455  BP: (!) 136/99  Pulse: 78  Resp: 16  Temp: 98.3 F (36.8 C)  SpO2: 96%   Vitals:   03/02/18 1455  Weight: 249 lb (112.9 kg)  Height:  (1.626 m)   Body mass index is 42.74 kg/m. Ideal Body Weight: Weight in (lb) to have BMI = 25: 145.3  GEN: WDWN, NAD, Non-toxic, A & O x 3, obese, otherwise looks well  HEENT: Atraumatic, Normocephalic. Neck supple. No masses, No LAD.  Bilateral TM wnl, oropharynx normal.  PEERL,EOMI.   Ears and Nose: No external deformity. CV: RRR, No M/G/R. No JVD. No thrill. No extra heart sounds. PULM: CTA B, no wheezes, crackles, rhonchi. No retractions. No resp. distress. No accessory muscle use. ABD: S, NT, ND, +BS. No rebound. No HSM. EXTR: No c/c/e NEURO Normal gait.  PSYCH: Normally interactive. Conversant. Not depressed or anxious appearing.  Calm demeanor.   EKG: normal SR, no ST elevation or  depression   Given GI cocktail - this resolved her sx  Assessment and Plan: Physical exam  Mild persistent asthma with acute exacerbation  Screening for diabetes mellitus - Plan: Comprehensive metabolic panel, Hemoglobin A1c  Screening for deficiency anemia - Plan: CBC  Screening for hyperlipidemia - Plan: Lipid panel  Chest pain, unspecified type - Plan: EKG 12-Lead, Troponin I  Gastroesophageal reflux disease, esophagitis presence not specified - Plan: H. pylori breath test  Here today for a CPE However she is also having CP.  Encouraged her to go to the ER now for active CP but she declines.  A GI cocktail did seem to resolve her pain.  We will also obtain a troponin outpt as she is not amenable to visiting the ER today.  Will check an H pylori breath test.  Discussed saxenda for weight loss.  Will touch base with her regarding this once we get her labs in   Signed Abbe Amsterdam, MD  Received her troponin- negative.  Will plan an ETT for her.   Received labs 5/7- message to pt  Labs all look great Blood count is normal Metabolic profile is normal A1c is normal Cholesterol is overall good H pylori is negative.   Please try an OTC PPI or H2 blocker for acid for 2-4 weeks.  Let me know if not helpful for you!  As per my previous message, I did go ahead and order a treadmill for you.  Let me know if not ok with you, but I think it would be nice to have this given your dad's history.   Take care!  Results for orders placed or performed in visit on 03/02/18  CBC  Result Value Ref Range   WBC 8.1 4.0 - 10.5 K/uL   RBC 4.67 3.87 - 5.11 Mil/uL   Platelets 276.0 150.0 - 400.0 K/uL   Hemoglobin 13.2 12.0 - 15.0 g/dL   HCT 16.1 09.6 - 04.5 %   MCV 86.1 78.0 - 100.0 fl   MCHC 32.8 30.0 - 36.0 g/dL   RDW 40.9 81.1 - 91.4 %  Comprehensive metabolic panel  Result Value Ref Range   Sodium 141 135 - 145 mEq/L   Potassium 4.0 3.5 - 5.1 mEq/L   Chloride 107 96 - 112 mEq/L   CO2  26 19 - 32 mEq/L   Glucose, Bld 100 (H) 70 - 99 mg/dL   BUN 11 6 - 23 mg/dL   Creatinine, Ser 7.82 0.40 - 1.20 mg/dL   Total Bilirubin 0.3 0.2 - 1.2 mg/dL   Alkaline Phosphatase 66 39 - 117 U/L   AST 13 0 - 37 U/L   ALT 9 0 - 35 U/L   Total Protein 6.8 6.0 - 8.3 g/dL   Albumin 3.7 3.5 - 5.2 g/dL   Calcium 8.5 8.4 - 95.6 mg/dL   GFR 21.30 >86.57 mL/min  Hemoglobin A1c  Result Value Ref Range   Hgb A1c MFr Bld 5.5 4.6 - 6.5 %  Lipid panel  Result Value Ref Range   Cholesterol 145 0 - 200 mg/dL   Triglycerides 846.9 0.0 - 149.0 mg/dL   HDL 62.95 >28.41 mg/dL   VLDL 32.4 0.0 - 40.1 mg/dL   LDL Cholesterol 73 0 - 99 mg/dL   Total CHOL/HDL Ratio 3    NonHDL 101.29   H. pylori breath test  Result Value Ref Range   H. pylori Breath Test NOT DETECTED NOT DETECT  Troponin I  Result Value Ref Range   TNIDX 0.00 0.00 - 0.06 ug/l

## 2018-03-02 ENCOUNTER — Ambulatory Visit (INDEPENDENT_AMBULATORY_CARE_PROVIDER_SITE_OTHER): Payer: No Typology Code available for payment source | Admitting: Family Medicine

## 2018-03-02 ENCOUNTER — Encounter: Payer: Self-pay | Admitting: Family Medicine

## 2018-03-02 VITALS — BP 140/88 | HR 78 | Temp 98.3°F | Resp 16 | Ht 64.0 in | Wt 249.0 lb

## 2018-03-02 DIAGNOSIS — Z130 Encounter for screening for diseases of the blood and blood-forming organs and certain disorders involving the immune mechanism: Secondary | ICD-10-CM | POA: Diagnosis not present

## 2018-03-02 DIAGNOSIS — Z13220 Encounter for screening for lipoid disorders: Secondary | ICD-10-CM | POA: Diagnosis not present

## 2018-03-02 DIAGNOSIS — K219 Gastro-esophageal reflux disease without esophagitis: Secondary | ICD-10-CM | POA: Diagnosis not present

## 2018-03-02 DIAGNOSIS — R079 Chest pain, unspecified: Secondary | ICD-10-CM

## 2018-03-02 DIAGNOSIS — Z131 Encounter for screening for diabetes mellitus: Secondary | ICD-10-CM | POA: Diagnosis not present

## 2018-03-02 DIAGNOSIS — J4531 Mild persistent asthma with (acute) exacerbation: Secondary | ICD-10-CM | POA: Diagnosis not present

## 2018-03-02 DIAGNOSIS — Z0000 Encounter for general adult medical examination without abnormal findings: Secondary | ICD-10-CM | POA: Diagnosis not present

## 2018-03-02 LAB — TROPONIN I: TNIDX: 0 ug/l (ref 0.00–0.06)

## 2018-03-02 MED ORDER — GI COCKTAIL ~~LOC~~: 30.0000 mL | Freq: Once | ORAL | Status: DC

## 2018-03-02 NOTE — Patient Instructions (Addendum)
It was good to see you today- take care and I will be in touch with your labs asap Use your ranitidine daily for a couple of weeks  If your chest pain continues please do let me know and I can set you up for a treadmill stress test   Health Maintenance, Female Adopting a healthy lifestyle and getting preventive care can go a long way to promote health and wellness. Talk with your health care provider about what schedule of regular examinations is right for you. This is a good chance for you to check in with your provider about disease prevention and staying healthy. In between checkups, there are plenty of things you can do on your own. Experts have done a lot of research about which lifestyle changes and preventive measures are most likely to keep you healthy. Ask your health care provider for more information. Weight and diet Eat a healthy diet  Be sure to include plenty of vegetables, fruits, low-fat dairy products, and lean protein.  Do not eat a lot of foods high in solid fats, added sugars, or salt.  Get regular exercise. This is one of the most important things you can do for your health. ? Most adults should exercise for at least 150 minutes each week. The exercise should increase your heart rate and make you sweat (moderate-intensity exercise). ? Most adults should also do strengthening exercises at least twice a week. This is in addition to the moderate-intensity exercise.  Maintain a healthy weight  Body mass index (BMI) is a measurement that can be used to identify possible weight problems. It estimates body fat based on height and weight. Your health care provider can help determine your BMI and help you achieve or maintain a healthy weight.  For females 25 years of age and older: ? A BMI below 18.5 is considered underweight. ? A BMI of 18.5 to 24.9 is normal. ? A BMI of 25 to 29.9 is considered overweight. ? A BMI of 30 and above is considered obese.  Watch levels of  cholesterol and blood lipids  You should start having your blood tested for lipids and cholesterol at 40 years of age, then have this test every 5 years.  You may need to have your cholesterol levels checked more often if: ? Your lipid or cholesterol levels are high. ? You are older than 40 years of age. ? You are at high risk for heart disease.  Cancer screening Lung Cancer  Lung cancer screening is recommended for adults 22-33 years old who are at high risk for lung cancer because of a history of smoking.  A yearly low-dose CT scan of the lungs is recommended for people who: ? Currently smoke. ? Have quit within the past 15 years. ? Have at least a 30-pack-year history of smoking. A pack year is smoking an average of one pack of cigarettes a day for 1 year.  Yearly screening should continue until it has been 15 years since you quit.  Yearly screening should stop if you develop a health problem that would prevent you from having lung cancer treatment.  Breast Cancer  Practice breast self-awareness. This means understanding how your breasts normally appear and feel.  It also means doing regular breast self-exams. Let your health care provider know about any changes, no matter how small.  If you are in your 20s or 30s, you should have a clinical breast exam (CBE) by a health care provider every 1-3 years as part  of a regular health exam.  If you are 76 or older, have a CBE every year. Also consider having a breast X-ray (mammogram) every year.  If you have a family history of breast cancer, talk to your health care provider about genetic screening.  If you are at high risk for breast cancer, talk to your health care provider about having an MRI and a mammogram every year.  Breast cancer gene (BRCA) assessment is recommended for women who have family members with BRCA-related cancers. BRCA-related cancers include: ? Breast. ? Ovarian. ? Tubal. ? Peritoneal cancers.  Results  of the assessment will determine the need for genetic counseling and BRCA1 and BRCA2 testing.  Cervical Cancer Your health care provider may recommend that you be screened regularly for cancer of the pelvic organs (ovaries, uterus, and vagina). This screening involves a pelvic examination, including checking for microscopic changes to the surface of your cervix (Pap test). You may be encouraged to have this screening done every 3 years, beginning at age 51.  For women ages 92-65, health care providers may recommend pelvic exams and Pap testing every 3 years, or they may recommend the Pap and pelvic exam, combined with testing for human papilloma virus (HPV), every 5 years. Some types of HPV increase your risk of cervical cancer. Testing for HPV may also be done on women of any age with unclear Pap test results.  Other health care providers may not recommend any screening for nonpregnant women who are considered low risk for pelvic cancer and who do not have symptoms. Ask your health care provider if a screening pelvic exam is right for you.  If you have had past treatment for cervical cancer or a condition that could lead to cancer, you need Pap tests and screening for cancer for at least 20 years after your treatment. If Pap tests have been discontinued, your risk factors (such as having a new sexual partner) need to be reassessed to determine if screening should resume. Some women have medical problems that increase the chance of getting cervical cancer. In these cases, your health care provider may recommend more frequent screening and Pap tests.  Colorectal Cancer  This type of cancer can be detected and often prevented.  Routine colorectal cancer screening usually begins at 40 years of age and continues through 40 years of age.  Your health care provider may recommend screening at an earlier age if you have risk factors for colon cancer.  Your health care provider may also recommend using  home test kits to check for hidden blood in the stool.  A small camera at the end of a tube can be used to examine your colon directly (sigmoidoscopy or colonoscopy). This is done to check for the earliest forms of colorectal cancer.  Routine screening usually begins at age 28.  Direct examination of the colon should be repeated every 5-10 years through 40 years of age. However, you may need to be screened more often if early forms of precancerous polyps or small growths are found.  Skin Cancer  Check your skin from head to toe regularly.  Tell your health care provider about any new moles or changes in moles, especially if there is a change in a mole's shape or color.  Also tell your health care provider if you have a mole that is larger than the size of a pencil eraser.  Always use sunscreen. Apply sunscreen liberally and repeatedly throughout the day.  Protect yourself by wearing long  sleeves, pants, a wide-brimmed hat, and sunglasses whenever you are outside.  Heart disease, diabetes, and high blood pressure  High blood pressure causes heart disease and increases the risk of stroke. High blood pressure is more likely to develop in: ? People who have blood pressure in the high end of the normal range (130-139/85-89 mm Hg). ? People who are overweight or obese. ? People who are African American.  If you are 1-31 years of age, have your blood pressure checked every 3-5 years. If you are 58 years of age or older, have your blood pressure checked every year. You should have your blood pressure measured twice-once when you are at a hospital or clinic, and once when you are not at a hospital or clinic. Record the average of the two measurements. To check your blood pressure when you are not at a hospital or clinic, you can use: ? An automated blood pressure machine at a pharmacy. ? A home blood pressure monitor.  If you are between 49 years and 71 years old, ask your health care provider  if you should take aspirin to prevent strokes.  Have regular diabetes screenings. This involves taking a blood sample to check your fasting blood sugar level. ? If you are at a normal weight and have a low risk for diabetes, have this test once every three years after 40 years of age. ? If you are overweight and have a high risk for diabetes, consider being tested at a younger age or more often. Preventing infection Hepatitis B  If you have a higher risk for hepatitis B, you should be screened for this virus. You are considered at high risk for hepatitis B if: ? You were born in a country where hepatitis B is common. Ask your health care provider which countries are considered high risk. ? Your parents were born in a high-risk country, and you have not been immunized against hepatitis B (hepatitis B vaccine). ? You have HIV or AIDS. ? You use needles to inject street drugs. ? You live with someone who has hepatitis B. ? You have had sex with someone who has hepatitis B. ? You get hemodialysis treatment. ? You take certain medicines for conditions, including cancer, organ transplantation, and autoimmune conditions.  Hepatitis C  Blood testing is recommended for: ? Everyone born from [birth year] through 1963. ? Anyone with known risk factors for hepatitis C.  Sexually transmitted infections (STIs)  You should be screened for sexually transmitted infections (STIs) including gonorrhea and chlamydia if: ? You are sexually active and are younger than 40 years of age. ? You are older than 40 years of age and your health care provider tells you that you are at risk for this type of infection. ? Your sexual activity has changed since you were last screened and you are at an increased risk for chlamydia or gonorrhea. Ask your health care provider if you are at risk.  If you do not have HIV, but are at risk, it may be recommended that you take a prescription medicine daily to prevent HIV infection. This  is called pre-exposure prophylaxis (PrEP). You are considered at risk if: ? You are sexually active and do not regularly use condoms or know the HIV status of your partner(s). ? You take drugs by injection. ? You are sexually active with a partner who has HIV.  Talk with your health care provider about whether you are at high risk of being infected with HIV. If  you choose to begin PrEP, you should first be tested for HIV. You should then be tested every 3 months for as long as you are taking PrEP. Pregnancy  If you are premenopausal and you may become pregnant, ask your health care provider about preconception counseling.  If you may become pregnant, take 400 to 800 micrograms (mcg) of folic acid every day.  If you want to prevent pregnancy, talk to your health care provider about birth control (contraception). Osteoporosis and menopause  Osteoporosis is a disease in which the bones lose minerals and strength with aging. This can result in serious bone fractures. Your risk for osteoporosis can be identified using a bone density scan.  If you are 35 years of age or older, or if you are at risk for osteoporosis and fractures, ask your health care provider if you should be screened.  Ask your health care provider whether you should take a calcium or vitamin D supplement to lower your risk for osteoporosis.  Menopause may have certain physical symptoms and risks.  Hormone replacement therapy may reduce some of these symptoms and risks. Talk to your health care provider about whether hormone replacement therapy is right for you. Follow these instructions at home:  Schedule regular health, dental, and eye exams.  Stay current with your immunizations.  Do not use any tobacco products including cigarettes, chewing tobacco, or electronic cigarettes.  If you are pregnant, do not drink alcohol.  If you are breastfeeding, limit how much and how often you drink alcohol.  Limit alcohol intake  to no more than 1 drink per day for nonpregnant women. One drink equals 12 ounces of beer, 5 ounces of wine, or 1 ounces of hard liquor.  Do not use street drugs.  Do not share needles.  Ask your health care provider for help if you need support or information about quitting drugs.  Tell your health care provider if you often feel depressed.  Tell your health care provider if you have ever been abused or do not feel safe at home. This information is not intended to replace advice given to you by your health care provider. Make sure you discuss any questions you have with your health care provider. Document Released: 04/29/2011 Document Revised: 03/21/2016 Document Reviewed: 07/18/2015 Elsevier Interactive Patient Education  [employer].

## 2018-03-03 ENCOUNTER — Encounter: Payer: Self-pay | Admitting: Family Medicine

## 2018-03-03 LAB — LIPID PANEL
CHOL/HDL RATIO: 3
Cholesterol: 145 mg/dL (ref 0–200)
HDL: 43.7 mg/dL (ref >39.00)
LDL Cholesterol: 73 mg/dL (ref 0–99)
NonHDL: 101.29
Triglycerides: 140 mg/dL (ref 0.0–149.0)
VLDL: 28 mg/dL (ref 0.0–40.0)

## 2018-03-03 LAB — COMPREHENSIVE METABOLIC PANEL
ALBUMIN: 3.7 g/dL (ref 3.5–5.2)
ALT: 9 U/L (ref 0–35)
AST: 13 U/L (ref 0–37)
Alkaline Phosphatase: 66 U/L (ref 39–117)
BUN: 11 mg/dL (ref 6–23)
CHLORIDE: 107 meq/L (ref 96–112)
CO2: 26 mEq/L (ref 19–32)
Calcium: 8.5 mg/dL (ref 8.4–10.5)
Creatinine, Ser: 0.75 mg/dL (ref 0.40–1.20)
GFR: 91.21 mL/min (ref >60.00)
GLUCOSE: 100 mg/dL — AB (ref 70–99)
Potassium: 4 mEq/L (ref 3.5–5.1)
Sodium: 141 mEq/L (ref 135–145)
Total Bilirubin: 0.3 mg/dL (ref 0.2–1.2)
Total Protein: 6.8 g/dL (ref 6.0–8.3)

## 2018-03-03 LAB — CBC
HCT: 40.2 % (ref 36.0–46.0)
Hemoglobin: 13.2 g/dL (ref 12.0–15.0)
MCHC: 32.8 g/dL (ref 30.0–36.0)
MCV: 86.1 fl (ref 78.0–100.0)
Platelets: 276 10*3/uL (ref 150.0–400.0)
RBC: 4.67 Mil/uL (ref 3.87–5.11)
RDW: 13.7 % (ref 11.5–15.5)
WBC: 8.1 10*3/uL (ref 4.0–10.5)

## 2018-03-03 LAB — H. PYLORI BREATH TEST: H. PYLORI BREATH TEST: NOT DETECTED

## 2018-03-03 LAB — HEMOGLOBIN A1C: HEMOGLOBIN A1C: 5.5 % (ref 4.6–6.5)

## 2018-03-05 MED FILL — NUVARING VAGINAL RING: 0.12-0.015 | 84 days supply | Qty: 3 | Fill #1

## 2018-03-06 ENCOUNTER — Telehealth (HOSPITAL_COMMUNITY): Payer: Self-pay

## 2018-03-06 NOTE — Telephone Encounter (Signed)
Encounter complete. 

## 2018-03-11 ENCOUNTER — Ambulatory Visit (HOSPITAL_COMMUNITY)
Admission: RE | Admit: 2018-03-11 | Discharge: 2018-03-11 | Disposition: A | Discharge status: Home / Self Care | Payer: No Typology Code available for payment source | Source: Ambulatory Visit | Attending: Family Medicine | Admitting: Family Medicine

## 2018-03-11 ENCOUNTER — Encounter: Payer: Self-pay | Admitting: Family Medicine

## 2018-03-11 DIAGNOSIS — R079 Chest pain, unspecified: Secondary | ICD-10-CM | POA: Diagnosis not present

## 2018-03-11 LAB — EXERCISE TOLERANCE TEST
CHL CUP MPHR: 181 {beats}/min
CSEPED: 9 min
Estimated workload: 10.5 METS
Exercise duration (sec): 17 s
Peak HR: 176 {beats}/min
Percent HR: 97 %
RPE: 17
Rest HR: 80 {beats}/min

## 2018-05-13 ENCOUNTER — Encounter: Payer: Self-pay | Admitting: Internal Medicine

## 2018-05-13 ENCOUNTER — Ambulatory Visit (HOSPITAL_BASED_OUTPATIENT_CLINIC_OR_DEPARTMENT_OTHER)
Admission: RE | Admit: 2018-05-13 | Discharge: 2018-05-13 | Disposition: A | Discharge status: Home / Self Care | Payer: No Typology Code available for payment source | Source: Ambulatory Visit | Attending: Internal Medicine | Admitting: Internal Medicine

## 2018-05-13 ENCOUNTER — Telehealth: Payer: Self-pay | Admitting: Family Medicine

## 2018-05-13 ENCOUNTER — Ambulatory Visit (INDEPENDENT_AMBULATORY_CARE_PROVIDER_SITE_OTHER): Payer: No Typology Code available for payment source | Admitting: Internal Medicine

## 2018-05-13 ENCOUNTER — Encounter: Payer: Self-pay | Admitting: Family Medicine

## 2018-05-13 ENCOUNTER — Ambulatory Visit: Payer: Self-pay | Admitting: *Deleted

## 2018-05-13 VITALS — BP 124/66 | HR 67 | Temp 97.9°F | Resp 16 | Ht 64.0 in | Wt 247.1 lb

## 2018-05-13 DIAGNOSIS — R1011 Right upper quadrant pain: Secondary | ICD-10-CM | POA: Insufficient documentation

## 2018-05-13 DIAGNOSIS — K8020 Calculus of gallbladder without cholecystitis without obstruction: Secondary | ICD-10-CM

## 2018-05-13 LAB — POC URINALSYSI DIPSTICK (AUTOMATED)
Bilirubin, UA: NEGATIVE
Glucose, UA: NEGATIVE
Ketones, UA: NEGATIVE
Leukocytes, UA: NEGATIVE
Nitrite, UA: NEGATIVE
PROTEIN UA: POSITIVE — AB
RBC UA: NEGATIVE
Spec Grav, UA: 1.025 (ref 1.010–1.025)
UROBILINOGEN UA: 0.2 U/dL
pH, UA: 6 (ref 5.0–8.0)

## 2018-05-13 LAB — CBC WITH DIFFERENTIAL/PLATELET
Basophils Absolute: 0.1 10*3/uL (ref 0.0–0.1)
Basophils Relative: 1.1 % (ref 0.0–3.0)
EOS PCT: 3.5 % (ref 0.0–5.0)
Eosinophils Absolute: 0.3 10*3/uL (ref 0.0–0.7)
HEMATOCRIT: 40.3 % (ref 36.0–46.0)
HEMOGLOBIN: 13.7 g/dL (ref 12.0–15.0)
Lymphocytes Relative: 22.4 % (ref 12.0–46.0)
Lymphs Abs: 1.7 10*3/uL (ref 0.7–4.0)
MCHC: 33.9 g/dL (ref 30.0–36.0)
MCV: 84.6 fl (ref 78.0–100.0)
Monocytes Absolute: 0.4 10*3/uL (ref 0.1–1.0)
Monocytes Relative: 5.5 % (ref 3.0–12.0)
Neutro Abs: 5.2 10*3/uL (ref 1.4–7.7)
Neutrophils Relative %: 67.5 % (ref 43.0–77.0)
Platelets: 301 10*3/uL (ref 150.0–400.0)
RBC: 4.76 Mil/uL (ref 3.87–5.11)
RDW: 13.8 % (ref 11.5–15.5)
WBC: 7.7 10*3/uL (ref 4.0–10.5)

## 2018-05-13 LAB — COMPREHENSIVE METABOLIC PANEL
ALBUMIN: 3.9 g/dL (ref 3.5–5.2)
ALK PHOS: 77 U/L (ref 39–117)
ALT: 11 U/L (ref 0–35)
AST: 11 U/L (ref 0–37)
BUN: 9 mg/dL (ref 6–23)
CHLORIDE: 106 meq/L (ref 96–112)
CO2: 27 mEq/L (ref 19–32)
Calcium: 8.9 mg/dL (ref 8.4–10.5)
Creatinine, Ser: 0.77 mg/dL (ref 0.40–1.20)
GFR: 88.39 mL/min (ref >60.00)
GLUCOSE: 101 mg/dL — AB (ref 70–99)
POTASSIUM: 4.1 meq/L (ref 3.5–5.1)
SODIUM: 140 meq/L (ref 135–145)
Total Bilirubin: 0.5 mg/dL (ref 0.2–1.2)
Total Protein: 6.9 g/dL (ref 6.0–8.3)

## 2018-05-13 LAB — LIPASE: Lipase: 11 U/L (ref 11.0–59.0)

## 2018-05-13 LAB — AMYLASE: AMYLASE: 21 U/L — AB (ref 27–131)

## 2018-05-13 NOTE — Telephone Encounter (Signed)
Pt called ;with having abd pain on the right upper abd, worst last night but somewhat better this morning. Still tender in that area. Also the pain will go around to her mid back at times. She also has been nauseated. Tried to eat a cracker this morning and took Pepto bismuth, made the nausea worst. Did have a loose stool that was clay colored yesterday and then had another stool that was clay colored with white around it that was more formed. She had chick filet yesterday evening, grilled chicken with lettuce and mayo.  Advised not to eat or drink anything until after seen by provider, pt voiced understanding. Appointment scheduled per protocol. Advised to call back if pain becomes worse, pt voiced understanding.   Reason for Disposition . [1] MILD-MODERATE pain AND [2] not relieved by antacids  Answer Assessment - Initial Assessment Questions 1. LOCATION: "Where does it hurt?"      Right upper abd 2. RADIATION: "Does the pain shoot anywhere else?" (e.g., chest, back)     Mid back 3. ONSET: "When did the pain begin?" (e.g., minutes, hours or days ago)      Last night 4. SUDDEN: "Gradual or sudden onset?"     gradual 5. PATTERN "Does the pain come and go, or is it constant?"    - If constant: "Is it getting better, staying the same, or worsening?"      (Note: Constant means the pain never goes away completely; most serious pain is constant and it progresses)     - If intermittent: "How long does it last?" "Do you have pain now?"     (Note: Intermittent means the pain goes away completely between bouts)     Constant but not as bad 6. SEVERITY: "How bad is the pain?"  (e.g., Scale 1-10; mild, moderate, or severe)    - MILD (1-3): doesn't interfere with normal activities, abdomen soft and not tender to touch     - MODERATE (4-7): interferes with normal activities or awakens from sleep, tender to touch     - SEVERE (8-10): excruciating pain, doubled over, unable to do any normal activities       Pain # now 2 or 3 but up to 10 last night 7. RECURRENT SYMPTOM: "Have you ever had this type of abdominal pain before?" If so, ask: "When was the last time?" and "What happened that time?"      no 8. AGGRAVATING FACTORS: "Does anything seem to cause this pain?" (e.g., foods, stress, alcohol)     no 9. CARDIAC SYMPTOMS: "Do you have any of the following symptoms: chest pain, difficulty breathing, sweating, nausea?"     Nausea, throat felt tight at times/breathing only a couple of times 10. OTHER SYMPTOMS: "Do you have any other symptoms?" (e.g., fever, vomiting, diarrhea)       Burping, bloating, loose stool yesterday, clay colored and white stools 11. PREGNANCY: "Is there any chance you are pregnant?" "When was your last menstrual period?"       No, LMP the end of June  Protocols used: ABDOMINAL PAIN - UPPER-A-AH

## 2018-05-13 NOTE — Progress Notes (Signed)
Pre visit review using our clinic review tool, if applicable. No additional management support is needed unless otherwise documented below in the visit note. 

## 2018-05-13 NOTE — Telephone Encounter (Signed)
Stephanie Long from ultrasound calling with stat results of abdominal ultrasound. Results available in Epic.   IMPRESSION: Cholelithiasis and trace pericholecystic fluid without other evidence of acute cholecystitis.

## 2018-05-13 NOTE — Patient Instructions (Signed)
GO TO THE LAB : Get the blood work      STOP BY THE FIRST FLOOR:   Schedule your ultrasound  Drink plenty fluids  Call or go to the ER if severe symptoms, fever, chills

## 2018-05-13 NOTE — Progress Notes (Signed)
Subjective:    Patient ID: Stephanie Long, female    DOB: [birth year], 42 y.o.   MRN: [id number]  DOS:  05/13/2018 Type of visit - description : Acute visit Interval history: Symptoms started 2 days ago, she had an episode of diarrhea after lunch, that night she also have A loose stool that was "pale looking". Yesterday  developed some nausea and again pale stools.  Then he started with right upper quadrant abdominal pain, that pain got increasingly worse and was severe but last night at some point pain stopped and now she is just sore on the right upper quadrant. Some radiation to the epigastric area and  to the back. No radiation to the lower abdomen. Reports very good compliance with NuvaRing.  Review of Systems Denies fever chills Takes occasional NSAIDs but no more than to 3 times a week No vomiting. No dysuria or gross hematuria She has GERD, on H2 blockers, occasional breakthrough symptoms. She has not eating anything today.  Slightly nauseous. Last BM this morning: Normal stool but on small amounts small amount  Past Medical History:  Diagnosis Date  . Allergy   . Asthma   . Diabetes mellitus without complication (HCC)   . GERD (gastroesophageal reflux disease)   . Gestational diabetes   . Gestational hypertension 05/09/2013  . Hx of varicella   . PCOS (polycystic ovarian syndrome) 2012  . Plantar fasciitis     Past Surgical History:  Procedure Laterality Date  . CESAREAN SECTION N/A 05/09/2013   Procedure: primary cesarean section with delivery of baby girl at 1800.  Apgars 9/9.;  Surgeon: Lenoard Adenichard J Taavon, MD;  Location: [facility];  Service: Obstetrics;  Laterality: N/A;  . WISDOM TOOTH EXTRACTION    . WISDOM TOOTH EXTRACTION      Social History   Socioeconomic History  . Marital status: Married    Spouse name: Not on file  . Number of children: 1  . Years of education: BSN  . Highest education level: Not on file  Occupational History  . Not on file    Social Needs  . Financial resource strain: Not on file  . Food insecurity:    Worry: Not on file    Inability: Not on file  . Transportation needs:    Medical: Not on file    Non-medical: Not on file  Tobacco Use  . Smoking status: Never Smoker  . Smokeless tobacco: Never Used  Substance and Sexual Activity  . Alcohol use: Yes    Alcohol/week: 1.2 oz    Types: 1 Glasses of wine, 1 Standard drinks or equivalent per week  . Drug use: No  . Sexual activity: Yes    Partners: Male  Lifestyle  . Physical activity:    Days per week: Not on file    Minutes per session: Not on file  . Stress: Not on file  Relationships  . Social connections:    Talks on phone: Not on file    Gets together: Not on file    Attends religious service: Not on file    Active member of club or organization: Not on file    Attends meetings of clubs or organizations: Not on file    Relationship status: Not on file  . Intimate partner violence:    Fear of current or ex partner: Not on file    Emotionally abused: Not on file    Physically abused: Not on file    Forced sexual activity: Not  on file  Other Topics Concern  . Not on file  Social History Narrative   Drinks 2 caffeine drinks a day       Allergies as of 05/13/2018   No Known Allergies     Medication List        Accurate as of 05/13/18  6:25 PM. Always use your most recent med list.          albuterol 108 (90 Base) MCG/ACT inhaler Commonly known as:  PROVENTIL HFA;VENTOLIN HFA Inhale 2 puffs into the lungs every 6 (six) hours as needed for wheezing or shortness of breath.   beclomethasone 40 MCG/ACT inhaler Commonly known as:  QVAR Inhale 2 puffs into the lungs daily.   calcium carbonate 500 MG chewable tablet Commonly known as:  TUMS - dosed in mg elemental calcium Chew 1 tablet by mouth daily.   cetirizine 10 MG tablet Commonly known as:  ZYRTEC Take 10 mg by mouth daily as needed.   etonogestrel-ethinyl estradiol  0.12-0.015 MG/24HR vaginal ring Commonly known as:  NUVARING Place 1 each vaginally every 28 (twenty-eight) days. Insert vaginally and leave in place for 3 consecutive weeks, then remove for 1 week.   fluticasone 50 MCG/ACT nasal spray Commonly known as:  FLONASE Place 1 spray into both nostrils as needed for allergies or rhinitis.   multivitamin tablet Take 1 tablet by mouth daily.   ranitidine 150 MG tablet Commonly known as:  ZANTAC One after breakfast and one after supper or bedtime          Objective:   Physical Exam BP 124/66 (BP Location: Left Wrist, Patient Position: Sitting, Cuff Size: Small)   Pulse 67   Temp 97.9 F (36.6 C) (Oral)   Resp 16   Ht 5\' 4"  (1.626 m)   Wt 247 lb 2 oz (112.1 kg)   LMP 04/20/2018 (Approximate)   SpO2 98%   BMI 42.42 kg/m  General:   Well developed, NAD, see BMI.  HEENT:  Normocephalic . Face symmetric, atraumatic Lungs:  CTA B Normal respiratory effort, no intercostal retractions, no accessory muscle use. Heart: RRR,  no murmur.  no pretibial edema bilaterally  Abdomen:  Not distended, soft, + tenderness on the right upper quadrant, no mass, rebound.  Rest of the abdomen is normal. Skin: Not pale. Not jaundice Neurologic:  alert & oriented X3.  Speech normal, gait appropriate for age and unassisted Psych--  Cognition and judgment appear intact.  Cooperative with normal attention span and concentration.  Behavior appropriate. No anxious or depressed appearing.     Assessment & Plan:    42 year old female, PMH includes asthma, on birth control pills, presents with RUQ abdominal pain: Highly suspicious for gallbladder stones. Udip: trace protein We will get CMP, CBC, lipase, amylase. Ultrasound of the abdomen hopefully today, she is n.p.o. ER if severe symptoms Recommend a bland diet.  Good hydration.

## 2018-05-14 NOTE — Telephone Encounter (Signed)
General surgery referral placed to Dr. Derrell Lollingamirez at [facility].

## 2018-05-20 ENCOUNTER — Ambulatory Visit: Payer: Self-pay | Admitting: General Surgery

## 2018-05-29 NOTE — Pre-Procedure Instructions (Addendum)
Stephanie Long  05/29/2018      [id number] [place] Otto, [place] - [address] AT [facility] [address] [place] [address] Phone: [phone] Fax: [phone]  [facility] - [place], [place] - [address] [place] [address] [place] [address] Phone: [phone] Fax: [phone]    Your procedure is scheduled on Mon., Aug. 12, 2019 from 1:00PM-2:30PM  Report to [facility] Admitting Entrance "A" at 11:00AM  Call this number if you have problems the morning of surgery:  [phone]   Remember:  Do not eat or drink after midnight on Aug. 11th    Take these medicines the morning of surgery with A SIP OF WATER: If needed Cetirizine (ZYRTEC), Ranitidine (ZANTAC), and Albuterol Inhaler (Bring with you the day of surgery)  As of today, stop taking all Other Aspirin Products, Vitamins, Fish oils, and Herbal medications. Also stop all NSAIDS i.e. Advil, Ibuprofen, Motrin, Aleve, Anaprox, Naproxen, BC, Goody Powders, and all Supplements.  How to Manage Your Diabetes Before and After Surgery  Why is it important to control my blood sugar before and after surgery? . Improving blood sugar levels before and after surgery helps healing and can limit problems. . A way of improving blood sugar control is eating a healthy diet by: o  Eating less sugar and carbohydrates o  Increasing activity/exercise o  Talking with your doctor about reaching your blood sugar goals . High blood sugars (greater than 180 mg/dL) can raise your risk of infections and slow your recovery, so you will need to focus on controlling your diabetes during the weeks before surgery. . Make sure that the doctor who takes care of your diabetes knows about your planned surgery including the date and location.  How do I manage my blood sugar before surgery? . Check your blood sugar at least 4 times a day, starting 2 days  before surgery, to make sure that the level is not too high or low. o Check your blood sugar the morning of your surgery when you wake up and every 2 hours until you get to the Short Stay unit. . If your blood sugar is less than 70 mg/dL, you will need to treat for low blood sugar: o Do not take insulin. o Treat a low blood sugar (less than 70 mg/dL) with  cup of clear juice (cranberry or apple), 4 glucose tablets, OR glucose gel. Recheck blood sugar in 15 minutes after treatment (to make sure it is greater than 70 mg/dL). If your blood sugar is not greater than 70 mg/dL on recheck, call [phone] o  for further instructions. . Report your blood sugar to the short stay nurse when you get to Short Stay. .  If your CBG is greater than 220 mg/dL, call us at the number above for further instructions.  . If you are admitted to the hospital after surgery: o Your blood sugar will be checked by the staff and you will probably be given insulin after surgery (instead of oral diabetes medicines) to make sure you have good blood sugar levels. o The goal for blood sugar control after surgery is 80-180 mg/dL  Reviewed and Endorsed by [facility] Patient Education Committee, August 2015    Do not wear jewelry, make-up or nail polish.  Do not wear lotions, powders, or perfumes, or deodorant.  Do not shave 48 hours prior to surgery.  Do not bring valuables to the hospital.  [facility] is not responsible for any belongings or valuables.  Contacts, dentures or bridgework may not be worn into surgery.  Leave your suitcase in the car.  After surgery it may be brought to your room.  For patients admitted to the hospital, discharge time will be determined by your treatment team.  Patients discharged the day of surgery will not be allowed to drive home.   Special instructions:   [facility]- Preparing For Surgery  Before surgery, you can play an important role. Because skin is not sterile, your skin  needs to be as free of germs as possible. You can reduce the number of germs on your skin by washing with CHG (chlorahexidine gluconate) Soap before surgery.  CHG is an antiseptic cleaner which kills germs and bonds with the skin to continue killing germs even after washing.    Oral Hygiene is also important to reduce your risk of infection.  Remember - BRUSH YOUR TEETH THE MORNING OF SURGERY WITH YOUR REGULAR TOOTHPASTE  Please do not use if you have an allergy to CHG or antibacterial soaps. If your skin becomes reddened/irritated stop using the CHG.  Do not shave (including legs and underarms) for at least 48 hours prior to first CHG shower. It is OK to shave your face.  Please follow these instructions carefully.   1. Shower the NIGHT BEFORE SURGERY and the MORNING OF SURGERY with CHG.   2. If you chose to wash your hair, wash your hair first as usual with your normal shampoo.  3. After you shampoo, rinse your hair and body thoroughly to remove the shampoo.  4. Use CHG as you would any other liquid soap. You can apply CHG directly to the skin and wash gently with a scrungie or a clean washcloth.   5. Apply the CHG Soap to your body ONLY FROM THE NECK DOWN.  Do not use on open wounds or open sores. Avoid contact with your eyes, ears, mouth and genitals (private parts). Wash Face and genitals (private parts)  with your normal soap.  6. Wash thoroughly, paying special attention to the area where your surgery will be performed.  7. Thoroughly rinse your body with warm water from the neck down.  8. DO NOT shower/wash with your normal soap after using and rinsing off the CHG Soap.  9. Pat yourself dry with a CLEAN TOWEL.  10. Wear CLEAN PAJAMAS to bed the night before surgery, wear comfortable clothes the morning of surgery  11. Place CLEAN SHEETS on your bed the night of your first shower and DO NOT SLEEP WITH PETS.  Day of Surgery:  Do not apply any deodorants/lotions.  Please wear  clean clothes to the hospital/surgery center.   Remember to brush your teeth WITH YOUR REGULAR TOOTHPASTE.  Please read over the following fact sheets that you were given. Pain Booklet, Coughing and Deep Breathing and Surgical Site Infection Prevention

## 2018-06-01 ENCOUNTER — Other Ambulatory Visit: Payer: Self-pay

## 2018-06-01 ENCOUNTER — Encounter (HOSPITAL_COMMUNITY)
Admission: RE | Admit: 2018-06-01 | Discharge: 2018-06-01 | Disposition: A | Discharge status: Home / Self Care | Payer: No Typology Code available for payment source | Source: Ambulatory Visit | Attending: General Surgery | Admitting: General Surgery

## 2018-06-01 ENCOUNTER — Encounter (HOSPITAL_COMMUNITY): Payer: Self-pay

## 2018-06-01 DIAGNOSIS — Z01812 Encounter for preprocedural laboratory examination: Secondary | ICD-10-CM | POA: Insufficient documentation

## 2018-06-01 HISTORY — DX: Calculus of gallbladder without cholecystitis without obstruction: K80.20

## 2018-06-01 LAB — BASIC METABOLIC PANEL
Anion gap: 7 (ref 5–15)
BUN: 10 mg/dL (ref 6–20)
CO2: 27 mmol/L (ref 22–32)
Calcium: 9.1 mg/dL (ref 8.9–10.3)
Chloride: 105 mmol/L (ref 98–111)
Creatinine, Ser: 0.81 mg/dL (ref 0.44–1.00)
GFR calc Af Amer: >60 mL/min (ref >60)
GLUCOSE: 107 mg/dL — AB (ref 70–99)
Potassium: 3.9 mmol/L (ref 3.5–5.1)
Sodium: 139 mmol/L (ref 135–145)

## 2018-06-01 LAB — CBC
HCT: 42.9 % (ref 36.0–46.0)
Hemoglobin: 13.5 g/dL (ref 12.0–15.0)
MCH: 27.8 pg (ref 26.0–34.0)
MCHC: 31.5 g/dL (ref 30.0–36.0)
MCV: 88.3 fL (ref 78.0–100.0)
Platelets: 291 10*3/uL (ref 150–400)
RBC: 4.86 MIL/uL (ref 3.87–5.11)
RDW: 13.3 % (ref 11.5–15.5)
WBC: 9.1 10*3/uL (ref 4.0–10.5)

## 2018-06-01 NOTE — Progress Notes (Signed)
PCP - Dr. Dallas Sch[place]- [place]  Cardiologist - Denies  Chest x-ray - Denies  EKG - 03/02/18 (E)  Stress Test - 03/11/18 (E)  ECHO - Denies  Cardiac Cath - Denies  Sleep Study - Denies CPAP - None  LABS- 06/01/18: CBC, BMP 06/08/18: POC UPreg  ASA- Denies   Anesthesia- No  Pt denies having chest pain, sob, or fever at this time. All instructions explained to the pt, with a verbal understanding of the material. Pt agrees to go over the instructions while at home for a better understanding. The opportunity to ask questions was provided.

## 2018-06-08 ENCOUNTER — Ambulatory Visit (HOSPITAL_COMMUNITY): Payer: No Typology Code available for payment source | Admitting: Anesthesiology

## 2018-06-08 ENCOUNTER — Other Ambulatory Visit: Payer: Self-pay

## 2018-06-08 ENCOUNTER — Encounter (HOSPITAL_COMMUNITY): Payer: Self-pay | Admitting: *Deleted

## 2018-06-08 ENCOUNTER — Ambulatory Visit (HOSPITAL_COMMUNITY)
Admission: RE | Admit: 2018-06-08 | Discharge: 2018-06-08 | Disposition: A | Discharge status: Home / Self Care | Payer: No Typology Code available for payment source | Source: Ambulatory Visit | Attending: General Surgery | Admitting: General Surgery

## 2018-06-08 ENCOUNTER — Encounter (HOSPITAL_COMMUNITY)
Admission: RE | Disposition: A | Discharge status: Home / Self Care | Payer: Self-pay | Source: Ambulatory Visit | Attending: General Surgery

## 2018-06-08 DIAGNOSIS — Z793 Long term (current) use of hormonal contraceptives: Secondary | ICD-10-CM | POA: Insufficient documentation

## 2018-06-08 DIAGNOSIS — Z79899 Other long term (current) drug therapy: Secondary | ICD-10-CM | POA: Diagnosis not present

## 2018-06-08 DIAGNOSIS — K219 Gastro-esophageal reflux disease without esophagitis: Secondary | ICD-10-CM | POA: Insufficient documentation

## 2018-06-08 DIAGNOSIS — K8020 Calculus of gallbladder without cholecystitis without obstruction: Secondary | ICD-10-CM | POA: Diagnosis present

## 2018-06-08 DIAGNOSIS — J45909 Unspecified asthma, uncomplicated: Secondary | ICD-10-CM | POA: Diagnosis not present

## 2018-06-08 DIAGNOSIS — K8010 Calculus of gallbladder with chronic cholecystitis without obstruction: Secondary | ICD-10-CM | POA: Insufficient documentation

## 2018-06-08 DIAGNOSIS — I10 Essential (primary) hypertension: Secondary | ICD-10-CM | POA: Diagnosis not present

## 2018-06-08 HISTORY — PX: CHOLECYSTECTOMY: SHX55

## 2018-06-08 LAB — POCT PREGNANCY, URINE: PREG TEST UR: NEGATIVE

## 2018-06-08 SURGERY — LAPAROSCOPIC CHOLECYSTECTOMY WITH INTRAOPERATIVE CHOLANGIOGRAM
Anesthesia: General | Site: Abdomen

## 2018-06-08 MED ORDER — LACTATED RINGERS IV SOLN
INTRAVENOUS | Status: DC
  Administered 2018-06-08: 11:00:00 via INTRAVENOUS

## 2018-06-08 MED ORDER — HYDROMORPHONE HCL 1 MG/ML IJ SOLN
0.2500 mg | INTRAMUSCULAR | Status: DC | PRN
  Administered 2018-06-08 (×4): 0.5 mg via INTRAVENOUS

## 2018-06-08 MED ORDER — CHLORHEXIDINE GLUCONATE CLOTH 2 % EX PADS: 6.0000 | MEDICATED_PAD | Freq: Once | CUTANEOUS | Status: DC

## 2018-06-08 MED ORDER — HYDROMORPHONE HCL 1 MG/ML IJ SOLN
INTRAMUSCULAR | Status: AC
  Administered 2018-06-08: 0.5 mg via INTRAVENOUS
  Filled 2018-06-08: qty 1

## 2018-06-08 MED ORDER — LIDOCAINE HCL (CARDIAC) PF 100 MG/5ML IV SOSY
PREFILLED_SYRINGE | INTRAVENOUS | Status: DC | PRN
  Administered 2018-06-08: 30 mg via INTRAVENOUS

## 2018-06-08 MED ORDER — MIDAZOLAM HCL 2 MG/2ML IJ SOLN
INTRAMUSCULAR | Status: AC
  Filled 2018-06-08: qty 2

## 2018-06-08 MED ORDER — CEFAZOLIN SODIUM-DEXTROSE 2-4 GM/100ML-% IV SOLN
INTRAVENOUS | Status: AC
  Filled 2018-06-08: qty 100

## 2018-06-08 MED ORDER — SODIUM CHLORIDE 0.9 % IR SOLN
Status: DC | PRN
  Administered 2018-06-08: 1000 mL

## 2018-06-08 MED ORDER — OXYCODONE HCL 5 MG PO TABS
ORAL_TABLET | ORAL | Status: AC
  Filled 2018-06-08: qty 1

## 2018-06-08 MED ORDER — CEFAZOLIN SODIUM-DEXTROSE 2-4 GM/100ML-% IV SOLN
2.0000 g | INTRAVENOUS | Status: AC
  Administered 2018-06-08: 2 g via INTRAVENOUS

## 2018-06-08 MED ORDER — LIDOCAINE 2% (20 MG/ML) 5 ML SYRINGE
INTRAMUSCULAR | Status: AC
  Filled 2018-06-08: qty 5

## 2018-06-08 MED ORDER — FENTANYL CITRATE (PF) 250 MCG/5ML IJ SOLN
INTRAMUSCULAR | Status: AC
  Filled 2018-06-08: qty 5

## 2018-06-08 MED ORDER — BUPIVACAINE-EPINEPHRINE 0.25% -1:200000 IJ SOLN
INTRAMUSCULAR | Status: DC | PRN
  Administered 2018-06-08: 20 mL

## 2018-06-08 MED ORDER — ONDANSETRON HCL 4 MG/2ML IJ SOLN
INTRAMUSCULAR | Status: AC
  Filled 2018-06-08: qty 2

## 2018-06-08 MED ORDER — PROPOFOL 10 MG/ML IV BOLUS
INTRAVENOUS | Status: AC
  Filled 2018-06-08: qty 20

## 2018-06-08 MED ORDER — DEXAMETHASONE SODIUM PHOSPHATE 10 MG/ML IJ SOLN
INTRAMUSCULAR | Status: AC
  Filled 2018-06-08: qty 1

## 2018-06-08 MED ORDER — PROPOFOL 10 MG/ML IV BOLUS
INTRAVENOUS | Status: DC | PRN
  Administered 2018-06-08: 200 mg via INTRAVENOUS

## 2018-06-08 MED ORDER — ROCURONIUM BROMIDE 10 MG/ML (PF) SYRINGE
PREFILLED_SYRINGE | INTRAVENOUS | Status: AC
  Filled 2018-06-08: qty 10

## 2018-06-08 MED ORDER — OXYCODONE HCL 5 MG PO TABS
5.0000 mg | ORAL_TABLET | Freq: Once | ORAL | Status: AC
  Administered 2018-06-08: 5 mg via ORAL

## 2018-06-08 MED ORDER — ACETAMINOPHEN 325 MG PO TABS
ORAL_TABLET | ORAL | Status: DC
Start: 2018-06-08 — End: 2018-06-08
  Filled 2018-06-08: qty 2

## 2018-06-08 MED ORDER — ONDANSETRON HCL 4 MG/2ML IJ SOLN
INTRAMUSCULAR | Status: DC | PRN
  Administered 2018-06-08: 4 mg via INTRAVENOUS

## 2018-06-08 MED ORDER — PHENYLEPHRINE 40 MCG/ML (10ML) SYRINGE FOR IV PUSH (FOR BLOOD PRESSURE SUPPORT)
PREFILLED_SYRINGE | INTRAVENOUS | Status: AC
Start: 2018-06-08 — End: ?
  Filled 2018-06-08: qty 10

## 2018-06-08 MED ORDER — OXYCODONE HCL 5 MG PO TABS: 5.0000 mg | ORAL_TABLET | Freq: Four times a day (QID) | ORAL | 0 refills | Status: DC | PRN

## 2018-06-08 MED ORDER — MIDAZOLAM HCL 5 MG/5ML IJ SOLN
INTRAMUSCULAR | Status: DC | PRN
  Administered 2018-06-08: 2 mg via INTRAVENOUS

## 2018-06-08 MED ORDER — HYDROMORPHONE HCL 1 MG/ML IJ SOLN
INTRAMUSCULAR | Status: AC
  Filled 2018-06-08: qty 1

## 2018-06-08 MED ORDER — SUGAMMADEX SODIUM 500 MG/5ML IV SOLN
INTRAVENOUS | Status: AC
  Filled 2018-06-08: qty 5

## 2018-06-08 MED ORDER — BUPIVACAINE-EPINEPHRINE (PF) 0.25% -1:200000 IJ SOLN
INTRAMUSCULAR | Status: AC
  Filled 2018-06-08: qty 30

## 2018-06-08 MED ORDER — DEXAMETHASONE SODIUM PHOSPHATE 10 MG/ML IJ SOLN
INTRAMUSCULAR | Status: DC | PRN
Start: 2018-06-08 — End: 2018-06-08
  Administered 2018-06-08: 10 mg via INTRAVENOUS

## 2018-06-08 MED ORDER — SCOPOLAMINE 1 MG/3DAYS TD PT72
1.0000 | MEDICATED_PATCH | TRANSDERMAL | Status: DC
  Administered 2018-06-08: 1.5 mg via TRANSDERMAL
  Filled 2018-06-08: qty 1

## 2018-06-08 MED ORDER — ACETAMINOPHEN 325 MG PO TABS
650.0000 mg | ORAL_TABLET | Freq: Once | ORAL | Status: AC
  Administered 2018-06-08: 650 mg via ORAL

## 2018-06-08 MED ORDER — 0.9 % SODIUM CHLORIDE (POUR BTL) OPTIME
TOPICAL | Status: DC | PRN
  Administered 2018-06-08: 1000 mL

## 2018-06-08 MED ORDER — FENTANYL CITRATE (PF) 100 MCG/2ML IJ SOLN
INTRAMUSCULAR | Status: DC | PRN
  Administered 2018-06-08: 150 ug via INTRAVENOUS

## 2018-06-08 MED ORDER — LACTATED RINGERS IV SOLN
INTRAVENOUS | Status: DC | PRN
  Administered 2018-06-08: 13:00:00 via INTRAVENOUS

## 2018-06-08 MED ORDER — IOPAMIDOL (ISOVUE-300) INJECTION 61%
INTRAVENOUS | Status: AC
  Filled 2018-06-08: qty 50

## 2018-06-08 MED ORDER — ROCURONIUM BROMIDE 50 MG/5ML IV SOSY
PREFILLED_SYRINGE | INTRAVENOUS | Status: DC | PRN
  Administered 2018-06-08: 50 mg via INTRAVENOUS

## 2018-06-08 MED ORDER — SUGAMMADEX SODIUM 200 MG/2ML IV SOLN
INTRAVENOUS | Status: DC | PRN
  Administered 2018-06-08: 250 mg via INTRAVENOUS

## 2018-06-08 MED ORDER — STERILE WATER FOR IRRIGATION IR SOLN
Status: DC | PRN
  Administered 2018-06-08: 1000 mL

## 2018-06-08 MED FILL — oxyCODONE HCL 5 MG TABS: 5 | 5 days supply | Qty: 20 | Fill #0

## 2018-06-08 SURGICAL SUPPLY — 46 items
APPLIER CLIP 5 13 M/L LIGAMAX5 (MISCELLANEOUS) ×3
CHLORAPREP W/TINT 26ML (MISCELLANEOUS) ×3 IMPLANT
CLIP APPLIE 5 13 M/L LIGAMAX5 (MISCELLANEOUS) ×1 IMPLANT
CONT SPEC 4OZ CLIKSEAL STRL BL (MISCELLANEOUS) ×3 IMPLANT
COVER MAYO STAND STRL (DRAPES) ×3 IMPLANT
COVER SURGICAL LIGHT HANDLE (MISCELLANEOUS) ×3 IMPLANT
DERMABOND ADVANCED (GAUZE/BANDAGES/DRESSINGS) ×2
DERMABOND ADVANCED .7 DNX12 (GAUZE/BANDAGES/DRESSINGS) ×1 IMPLANT
DRAPE C-ARM 42X72 X-RAY (DRAPES) ×3 IMPLANT
ELECT REM PT RETURN 9FT ADLT (ELECTROSURGICAL) ×3
ELECTRODE REM PT RTRN 9FT ADLT (ELECTROSURGICAL) ×1 IMPLANT
FILTER SMOKE EVAC LAPAROSHD (FILTER) ×3 IMPLANT
GLOVE BIO SURGEON STRL SZ 6.5 (GLOVE) ×2 IMPLANT
GLOVE BIO SURGEON STRL SZ7 (GLOVE) ×3 IMPLANT
GLOVE BIO SURGEON STRL SZ8 (GLOVE) ×3 IMPLANT
GLOVE BIO SURGEONS STRL SZ 6.5 (GLOVE) ×1
GLOVE BIOGEL PI IND STRL 6.5 (GLOVE) ×1 IMPLANT
GLOVE BIOGEL PI IND STRL 7.0 (GLOVE) ×2 IMPLANT
GLOVE BIOGEL PI IND STRL 8 (GLOVE) ×1 IMPLANT
GLOVE BIOGEL PI INDICATOR 6.5 (GLOVE) ×2
GLOVE BIOGEL PI INDICATOR 7.0 (GLOVE) ×4
GLOVE BIOGEL PI INDICATOR 8 (GLOVE) ×2
GOWN STRL REUS W/ TWL LRG LVL3 (GOWN DISPOSABLE) ×2 IMPLANT
GOWN STRL REUS W/ TWL XL LVL3 (GOWN DISPOSABLE) ×1 IMPLANT
GOWN STRL REUS W/TWL LRG LVL3 (GOWN DISPOSABLE) ×4
GOWN STRL REUS W/TWL XL LVL3 (GOWN DISPOSABLE) ×2
KIT BASIN OR (CUSTOM PROCEDURE TRAY) ×3 IMPLANT
KIT TURNOVER KIT B (KITS) ×3 IMPLANT
L-HOOK LAP DISP 36CM (ELECTROSURGICAL) ×3
LHOOK LAP DISP 36CM (ELECTROSURGICAL) ×1 IMPLANT
NEEDLE 22X1 1/2 (OR ONLY) (NEEDLE) ×3 IMPLANT
NS IRRIG 1000ML POUR BTL (IV SOLUTION) ×3 IMPLANT
PAD ARMBOARD 7.5X6 YLW CONV (MISCELLANEOUS) ×3 IMPLANT
PENCIL BUTTON HOLSTER BLD 10FT (ELECTRODE) ×3 IMPLANT
POUCH RETRIEVAL ECOSAC 10 (ENDOMECHANICALS) ×1 IMPLANT
POUCH RETRIEVAL ECOSAC 10MM (ENDOMECHANICALS) ×2
SCISSORS LAP 5X35 DISP (ENDOMECHANICALS) ×3 IMPLANT
SET IRRIG TUBING LAPAROSCOPIC (IRRIGATION / IRRIGATOR) ×3 IMPLANT
SLEEVE ENDOPATH XCEL 5M (ENDOMECHANICALS) ×6 IMPLANT
SPECIMEN JAR SMALL (MISCELLANEOUS) ×3 IMPLANT
SUT VIC AB 4-0 PS2 27 (SUTURE) ×3 IMPLANT
SUT VICRYL 0 UR6 27IN ABS (SUTURE) ×3 IMPLANT
TRAY LAPAROSCOPIC MC (CUSTOM PROCEDURE TRAY) ×3 IMPLANT
TROCAR XCEL BLUNT TIP 100MML (ENDOMECHANICALS) ×3 IMPLANT
TROCAR XCEL NON-BLD 5MMX100MML (ENDOMECHANICALS) ×3 IMPLANT
WATER STERILE IRR 1000ML POUR (IV SOLUTION) ×3 IMPLANT

## 2018-06-08 NOTE — Anesthesia Procedure Notes (Signed)
Procedure Name: Intubation Date/Time: 06/08/2018 1:13 PM Performed by: Eligha Bridegroom, CRNA Pre-anesthesia Checklist: Patient identified, Emergency Drugs available, Suction available, Patient being monitored and Timeout performed Patient Re-evaluated:Patient Re-evaluated prior to induction Oxygen Delivery Method: Circle system utilized Preoxygenation: Pre-oxygenation with 100% oxygen Induction Type: IV induction Ventilation: Oral airway inserted - appropriate to patient size Laryngoscope Size: Mac and 3 Grade View: Grade I Tube type: Oral Tube size: 7.0 mm Number of attempts: 1 Airway Equipment and Method: Stylet Placement Confirmation: ETT inserted through vocal cords under direct vision,  breath sounds checked- equal and bilateral and positive ETCO2 Secured at: 22 cm Tube secured with: Tape Dental Injury: Teeth and Oropharynx as per pre-operative assessment

## 2018-06-08 NOTE — Interval H&P Note (Signed)
History and Physical Interval Note:  06/08/2018 1:00 PM  Curlene LabrumJennifer B Halk  has presented today for surgery, with the diagnosis of Symptomatic cholelithisasis  The various methods of treatment have been discussed with the patient and family. After consideration of risks, benefits and other options for treatment, the patient has consented to  Procedure(s): LAPAROSCOPIC CHOLECYSTECTOMY WITH INTRAOPERATIVE CHOLANGIOGRAM (N/A) as a surgical intervention .  The patient's history has been reviewed, patient examined, no change in status, stable for surgery.  I have reviewed the patient's chart and labs.  Questions were answered to the patient's satisfaction.     Liz MaladyBurke E Shawonda Kerce

## 2018-06-08 NOTE — H&P (Signed)
Stephanie LabrumJennifer B Long Documented: 05/20/2018 10:27 AM Location: [facility] Patient #: [id number] DOB: April 22, 1976 Married / Language: [place] / Race: White Female   History of Present Illness Stephanie Long(Stephanie Long; 05/20/2018 10:44 AM) The patient is a 42 year old female who presents for evaluation of gallbladder disease. I was asked to see Stephanie DikeJennifer in consultation by Dr. Drue NovelPaz regarding symptomatic cholelithiasis. She had an episode of right upper quadrant pain recently which moved to her epigastrium and then around to her back. It was severe and lasted for several hours. It resolved spontaneously and Dr. Drue NovelPaz for further evaluation with laboratory studies and right upper quadrant ultrasound 7/17. Her ultrasound showed multiple stones and trace pericholecystic fluid. She occasionally has very mild pain. It is almost completely gone. She has been avoiding fatty foods. Liver function tests were normal.   Past Surgical History Stephanie Long(Stephanie Long, Stephanie Long; 05/20/2018 10:42 AM) Cesarean Section - 1  Oral Surgery   Diagnostic Studies History Stephanie Long(Stephanie Long, Stephanie Long; 05/20/2018 10:42 AM) Colonoscopy  never Mammogram  1-3 years ago Pap Smear  1-5 years ago  Allergies Stephanie Long(Stephanie Long, Stephanie Long; 05/20/2018 10:28 AM) Allergies Reconciled   Medication History Stephanie Long(Stephanie Long, Stephanie Long; 05/20/2018 10:28 AM) NuvaRing (0.12-0.015MG /95MW/24HR Ring, Vaginal) Active.  Social History Stephanie Long(Stephanie Long, Stephanie Long; 05/20/2018 10:42 AM) Alcohol use  Occasional alcohol use. Caffeine use  Coffee. No drug use  Tobacco use  Never smoker.  Family History Stephanie Long(Stephanie Long, Stephanie Long; 05/20/2018 10:42 AM) Diabetes Mellitus  Father. Heart Disease  Father. Hypertension  Father.  Pregnancy / Birth History Stephanie Long(Stephanie Long, Stephanie Long; 05/20/2018 10:42 AM) Age at menarche  13 years. Gravida  1 Length (months) of breastfeeding  3-6 Maternal age  40-35 Para  1 Regular periods   Other Problems (Stephanie Long, Stephanie Long; 05/20/2018  10:42 AM) Asthma  Cholelithiasis  Gastroesophageal Reflux Disease  Other disease, cancer, significant illness     Review of Systems (Stephanie Long Stephanie Long; 05/20/2018 10:42 AM) General Present- Fatigue. Not Present- Appetite Loss, Chills, Fever, Night Sweats, Weight Gain and Weight Loss. Gastrointestinal Present- Abdominal Pain and Nausea. Not Present- Bloating, Bloody Stool, Change in Bowel Habits, Chronic diarrhea, Constipation, Difficulty Swallowing, Excessive gas, Gets full quickly at meals, Hemorrhoids, Indigestion, Rectal Pain and Vomiting.  Vitals Stephanie Long(Stephanie Long Stephanie Long; 05/20/2018 10:30 AM) 05/20/2018 10:28 AM Temp.: 98.43F  Pulse: 106 (Regular)  BP: 150/100 (Sitting, Left Arm, Standard)       Physical Exam Stephanie Long(Stephanie Long; 05/20/2018 10:44 AM) General Mental Status-Alert. General Appearance-Consistent with stated age. Hydration-Well hydrated. Voice-Normal.  Head and Neck Head-normocephalic, atraumatic with no lesions or palpable masses. Trachea-midline. Thyroid Gland Characteristics - normal size and consistency.  Eye Eyeball - Bilateral-Extraocular movements intact. Sclera/Conjunctiva - Bilateral-No scleral icterus.  Chest and Lung Exam Chest and lung exam reveals -quiet, even and easy respiratory effort with no use of accessory muscles and on auscultation, normal breath sounds, no adventitious sounds and normal vocal resonance. Inspection Chest Wall - Normal. Back - normal.  Cardiovascular Cardiovascular examination reveals -normal heart sounds, regular rate and rhythm with no murmurs and normal pedal pulses bilaterally.  Abdomen Inspection Inspection of the abdomen reveals - No Hernias. Palpation/Percussion Palpation and Percussion of the abdomen reveal - Soft, Non Tender, No Rebound tenderness, No Rigidity (guarding) and No hepatosplenomegaly. Auscultation Auscultation of the abdomen reveals - Bowel sounds  normal.  Neurologic Neurologic evaluation reveals -alert and oriented x 3 with no impairment of recent or remote memory. Mental Status-Normal.  Musculoskeletal Global Assessment -Note: no gross deformities.  Normal  Exam - Left-Upper Extremity Strength Normal and Lower Extremity Strength Normal. Normal Exam - Right-Upper Extremity Strength Normal and Lower Extremity Strength Normal.  Lymphatic Head & Neck  General Head & Neck Lymphatics: Bilateral - Description - Normal. Axillary  General Axillary Region: Bilateral - Description - Normal. Tenderness - Non Tender. Femoral & Inguinal  Generalized Femoral & Inguinal Lymphatics: Bilateral - Description - No Generalized lymphadenopathy.    Assessment & Plan Stephanie Long(Stephanie Long; 05/20/2018 10:45 AM) SYMPTOMATIC CHOLELITHIASIS (K80.20) Impression: I have offered laparoscopic cholecystectomy with intraoperative cholangiogram. I discussed the procedure, risks, and benefits with her. I also discussed the usual expected postoperative course. She will need to avoid heavy lifting for 4 weeks after surgery. 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 She may be able to go back to work with light duty after 2 weeks or so. I answered her questions. She is agreeable.  06/08/18 re-exam - no changes.  Stephanie GelinasBurke Jazsmin Long, Long, MPH, FACS Trauma: [phone] General Surgery: [phone]

## 2018-06-08 NOTE — Anesthesia Postprocedure Evaluation (Signed)
Anesthesia Post Note  Patient: Stephanie Long  Procedure(s) Performed: LAPAROSCOPIC CHOLECYSTECTOMY (N/A Abdomen)     Patient location during evaluation: PACU Anesthesia Type: General Level of consciousness: awake and alert Pain management: pain level controlled Vital Signs Assessment: post-procedure vital signs reviewed and stable Respiratory status: spontaneous breathing, nonlabored ventilation, respiratory function stable and patient connected to nasal cannula oxygen Cardiovascular status: blood pressure returned to baseline and stable Postop Assessment: no apparent nausea or vomiting Anesthetic complications: no    Last Vitals:  Vitals:   06/08/18 1724 06/08/18 1735  BP: (!) 134/93 (!) 155/87  Pulse: 75 72  Resp: 20 16  Temp: (!) 36.4 C   SpO2: 93% 100%    Last Pain:  Vitals:   06/08/18 1735  TempSrc:   PainSc: 0-No pain                 Curtis Cain P Craigory Toste

## 2018-06-08 NOTE — Transfer of Care (Signed)
Immediate Anesthesia Transfer of Care Note  Patient: Stephanie Long  Procedure(s) Performed: LAPAROSCOPIC CHOLECYSTECTOMY (N/A Abdomen)  Patient Location: PACU  Anesthesia Type:General  Level of Consciousness: awake and alert   Airway & Oxygen Therapy: Patient Spontanous Breathing  Post-op Assessment: Report given to RN  Post vital signs: Reviewed and stable  Last Vitals:  Vitals Value Taken Time  BP 134/87 06/08/2018  2:23 PM  Temp    Pulse 98 06/08/2018  2:23 PM  Resp 20 06/08/2018  2:23 PM  SpO2 100 % 06/08/2018  2:23 PM  Vitals shown include unvalidated device data.  Last Pain:  Vitals:   06/08/18 1133  TempSrc: Oral  PainSc:       Patients Stated Pain Goal: 3 (06/08/18 1109)  Complications: No apparent anesthesia complications

## 2018-06-08 NOTE — Anesthesia Preprocedure Evaluation (Addendum)
Anesthesia Evaluation  Patient identified by MRN, date of birth, ID band Patient awake    Reviewed: Allergy & Precautions, NPO status , Patient's Chart, lab work & pertinent test results  History of Anesthesia Complications Negative for: history of anesthetic complications  Airway Mallampati: I  TM Distance: >3 FB Neck ROM: Full    Dental  (+) Teeth Intact   Pulmonary asthma ,    breath sounds clear to auscultation       Cardiovascular hypertension,  Rhythm:Regular Rate:Normal  Stress Test 03/11/18 Blood pressure demonstrated a hypotensive response to exercise. There was no ST segment deviation noted during stress. No ischemic EKG changes noted with exercise.   Neuro/Psych negative neurological ROS  negative psych ROS   GI/Hepatic Neg liver ROS, GERD  Controlled and Medicated,  Endo/Other  negative endocrine ROSdiabetes  Renal/GU negative Renal ROS  negative genitourinary   Musculoskeletal negative musculoskeletal ROS (+)   Abdominal   Peds negative pediatric ROS (+)  Hematology negative hematology ROS (+)   Anesthesia Other Findings   Reproductive/Obstetrics negative OB ROS                           Anesthesia Physical Anesthesia Plan  ASA: II  Anesthesia Plan: General   Post-op Pain Management:    Induction: Intravenous  PONV Risk Score and Plan: 3 and Midazolam, Dexamethasone, Ondansetron and Scopolamine patch - Pre-op  Airway Management Planned: Oral ETT  Additional Equipment:   Intra-op Plan:   Post-operative Plan: Extubation in OR  Informed Consent: I have reviewed the patients History and Physical, chart, labs and discussed the procedure including the risks, benefits and alternatives for the proposed anesthesia with the patient or authorized representative who has indicated his/her understanding and acceptance.   Dental advisory given  Plan Discussed with:  CRNA  Anesthesia Plan Comments:        Anesthesia Quick Evaluation

## 2018-06-08 NOTE — Op Note (Signed)
**Note De-Long via Obfuscation** 06/08/2018  2:15 PM  PATIENT:  Stephanie Long  42 y.o. female  PRE-OPERATIVE DIAGNOSIS:  Symptomatic cholelithiasis  POST-OPERATIVE DIAGNOSIS: Chronic cholecystitis with cholelithiasis  PROCEDURE:  Procedure(s): LAPAROSCOPIC CHOLECYSTECTOMY  SURGEON:  Surgeon(s): Violeta Gelinashompson, Stefano Trulson, MD  ASSISTANTS: none   ANESTHESIA:   local and general  EBL:  No intake/output data recorded.  BLOOD ADMINISTERED:none  DRAINS: none   SPECIMEN:  Excision  DISPOSITION OF SPECIMEN:  PATHOLOGY  COUNTS:  YES  DICTATION: .Dragon Dictation Findings: Chronic cholecystitis with cholelithiasis, short cystic duct, replaced right hepatic artery  Procedure in detail: Stephanie Long presents for cholecystectomy.  Stephanie Long in preop holding area.  Informed consent was obtained.  Stephanie Long received intravenous antibiotics.  Stephanie Long was brought to the operating room and general endotracheal anesthesia was administered by the anesthesia staff.  Stephanie Long abdomen was prepped and draped in a sterile fashion.  Timeout procedure was performed.The supraumbilical region was infiltrated with local. Supraumbilical incision was made. Subcutaneous tissues were dissected down revealing the anterior fascia. This was divided sharply along the midline. Peritoneal cavity was entered under direct vision without complication. A 0 Vicryl pursestring was placed around the fascial opening. Hassan trocar was inserted into the abdomen. The abdomen was insufflated with carbon dioxide in standard fashion. Under direct vision a 5 mm epigastric and 5 mm right abdominal ports x2 were placed.  Local was used at each port site.  Laparoscopic exploration revealed evidence of chronic cholecystitis with adherent omentum to the body of the gallbladder.  The dome of the gallbladder was retracted superior medially.  The infundibulum and body had omental adhesions which were gently taken down using cautery.  The infundibulum was then revealed and retracted  inferior laterally.  Dissection began laterally and progressed medially identifying a short cystic duct.  Stephanie Long also had a replaced right hepatic artery from which the cystic artery arose.  Dissection continued until we had a critical view of safety around the cystic duct.  The cystic duct was too short to perform a cholangiogram.  2 clips were placed proximally on the cystic duct and one was placed distally and it was divided.  The replaced right hepatic artery was then further delineated with the cystic artery arising off of it.  The cystic artery was clipped twice proximally, once distally and divided.  The gallbladder was taken off the liver bed using cautery and achieving excellent hemostasis along the way.  It was placed in a bag and removed from the abdomen.  It was sent to pathology.  The liver bed was copiously irrigated and hemostasis was ensured with cautery.  All of the clips remain in good position.  Irrigation fluid was evacuated.  A four-quadrant inspection was performed and no complications were noted.  Ports were then removed under direct vision.  Pneumoperitoneum was released.  Supraumbilical fascia was closed with running 0 Vicryl.  All 4 wounds were irrigated and the skin of each was closed with running 4-0 Vicryl followed by Dermabond.  All counts were correct.  Stephanie Long without apparent complication and was taken recovery in stable condition.  PATIENT DISPOSITION:  PACU - hemodynamically stable.   Delay start of Pharmacological VTE agent (>24hrs) due to surgical blood loss or risk of bleeding:  no  Violeta GelinasBurke Robyne Matar, MD, MPH, FACS Pager: [phone]  8/12/20192:15 PM

## 2018-06-09 ENCOUNTER — Encounter (HOSPITAL_COMMUNITY): Payer: Self-pay | Admitting: General Surgery

## 2018-07-01 MED FILL — NUVARING VAGINAL RING: 0.12-0.015 | 84 days supply | Qty: 3 | Fill #2

## 2018-08-30 ENCOUNTER — Ambulatory Visit (INDEPENDENT_AMBULATORY_CARE_PROVIDER_SITE_OTHER): Payer: Self-pay | Admitting: Nurse Practitioner

## 2018-08-30 VITALS — BP 125/90 | HR 100 | Temp 99.7°F | Resp 20 | Wt 255.0 lb

## 2018-08-30 DIAGNOSIS — J029 Acute pharyngitis, unspecified: Secondary | ICD-10-CM

## 2018-08-30 DIAGNOSIS — J069 Acute upper respiratory infection, unspecified: Secondary | ICD-10-CM

## 2018-08-30 DIAGNOSIS — R6889 Other general symptoms and signs: Secondary | ICD-10-CM

## 2018-08-30 LAB — POCT RAPID STREP A (OFFICE): Rapid Strep A Screen: NEGATIVE

## 2018-08-30 LAB — POCT INFLUENZA A/B
Influenza A, POC: NEGATIVE
Influenza B, POC: NEGATIVE

## 2018-08-30 MED ORDER — MAGIC MOUTHWASH W/LIDOCAINE: 5.0000 mL | Freq: Three times a day (TID) | ORAL | 0 refills | Status: AC | PRN

## 2018-08-30 MED ORDER — AMOXICILLIN 875 MG PO TABS: 875.0000 mg | ORAL_TABLET | Freq: Two times a day (BID) | ORAL | 0 refills | Status: AC

## 2018-08-30 NOTE — Patient Instructions (Signed)
Upper Respiratory Infection, Adult -Take medications as prescribed. -Ibuprofen 800 mg every 8 hours for the next 3 days.  May stop sooner if symptoms improve. -Warm salt water gargles 3-4 times daily for throat discomfort. -May use a teaspoon of honey as needed for sore throat. -Use a humidifier or vaporizer when at home and during sleep. -Increase fluids. -Rest as much as possible. - Follow-up if symptoms do not improve within the next 3 to 5 days.  Most upper respiratory infections (URIs) are a viral infection of the air passages leading to the lungs. A URI affects the nose, throat, and upper air passages. The most common type of URI is nasopharyngitis and is typically referred to as "the common cold." URIs run their course and usually go away on their own. Most of the time, a URI does not require medical attention, but sometimes a bacterial infection in the upper airways can follow a viral infection. This is called a secondary infection. Sinus and middle ear infections are common types of secondary upper respiratory infections. Bacterial pneumonia can also complicate a URI. A URI can worsen asthma and chronic obstructive pulmonary disease (COPD). Sometimes, these complications can require emergency medical care and may be life threatening. What are the causes? Almost all URIs are caused by viruses. A virus is a type of germ and can spread from one person to another. What increases the risk? You may be at risk for a URI if:  You smoke.  You have chronic heart or lung disease.  You have a weakened defense (immune) system.  You are very young or very old.  You have nasal allergies or asthma.  You work in crowded or poorly ventilated areas.  You work in health care facilities or schools.  What are the signs or symptoms? Symptoms typically develop 2-3 days after you come in contact with a cold virus. Most viral URIs last 7-10 days. However, viral URIs from the influenza virus (flu  virus) can last 14-18 days and are typically more severe. Symptoms may include:  Runny or stuffy (congested) nose.  Sneezing.  Cough.  Sore throat.  Headache.  Fatigue.  Fever.  Loss of appetite.  Pain in your forehead, behind your eyes, and over your cheekbones (sinus pain).  Muscle aches.  How is this diagnosed? Your health care provider may diagnose a URI by:  Physical exam.  Tests to check that your symptoms are not due to another condition such as: ? Strep throat. ? Sinusitis. ? Pneumonia. ? Asthma.  How is this treated? A URI goes away on its own with time. It cannot be cured with medicines, but medicines may be prescribed or recommended to relieve symptoms. Medicines may help:  Reduce your fever.  Reduce your cough.  Relieve nasal congestion.  Follow these instructions at home:  Take medicines only as directed by your health care provider.  Gargle warm saltwater or take cough drops to comfort your throat as directed by your health care provider.  Use a warm mist humidifier or inhale steam from a shower to increase air moisture. This may make it easier to breathe.  Drink enough fluid to keep your urine clear or pale yellow.  Eat soups and other clear broths and maintain good nutrition.  Rest as needed.  Return to work when your temperature has returned to normal or as your health care provider advises. You may need to stay home longer to avoid infecting others. You can also use a face mask and careful  hand washing to prevent spread of the virus.  Increase the usage of your inhaler if you have asthma.  Do not use any tobacco products, including cigarettes, chewing tobacco, or electronic cigarettes. If you need help quitting, ask your health care provider. How is this prevented? The best way to protect yourself from getting a cold is to practice good hygiene.  Avoid oral or hand contact with people with cold symptoms.  Wash your hands often if  contact occurs.  There is no clear evidence that vitamin C, vitamin E, echinacea, or exercise reduces the chance of developing a cold. However, it is always recommended to get plenty of rest, exercise, and practice good nutrition. Contact a health care provider if:  You are getting worse rather than better.  Your symptoms are not controlled by medicine.  You have chills.  You have worsening shortness of breath.  You have brown or red mucus.  You have yellow or brown nasal discharge.  You have pain in your face, especially when you bend forward.  You have a fever.  You have swollen neck glands.  You have pain while swallowing.  You have white areas in the back of your throat. Get help right away if:  You have severe or persistent: ? Headache. ? Ear pain. ? Sinus pain. ? Chest pain.  You have chronic lung disease and any of the following: ? Wheezing. ? Prolonged cough. ? Coughing up blood. ? A change in your usual mucus.  You have a stiff neck.  You have changes in your: ? Vision. ? Hearing. ? Thinking. ? Mood. This information is not intended to replace advice given to you by your health care provider. Make sure you discuss any questions you have with your health care provider. Document Released: 04/09/2001 Document Revised: 06/16/2016 Document Reviewed: 01/19/2014 Elsevier Interactive Patient Education  [employer].

## 2018-08-30 NOTE — Progress Notes (Signed)
Subjective:     Stephanie Long is a 42 y.o. female who presents for evaluation of sore throat. Associated symptoms include fevers up to 100.5 degrees, enlarged tonsils, headache, pain while swallowing, post nasal drip, sore throat and swollen glands. Onset of symptoms was 6 days ago, and have been rapidly worsening.  Patient states when her symptoms started she took over-the-counter medications which seemed to help.  Patient states that last night at work she started having body aches, fatigue, and did have onset of fever this morning.  Patient states her throat also began bothering her much worse this morning.  Patient rates current throat pain 6/10.  She is drinking plenty of fluids. She has not had a recent close exposure to someone with proven streptococcal pharyngitis.  The following portions of the patient's history were reviewed and updated as appropriate: allergies, current medications and past medical history.  Review of Systems Constitutional: positive for anorexia, fatigue, fevers and malaise, negative for night sweats, sweats and weight loss Eyes: negative Ears, nose, mouth, throat, and face: positive for nasal congestion and sore throat, negative for ear drainage and earaches Respiratory: negative Cardiovascular: negative Gastrointestinal: negative except for decreased appetite Neurological: negative except for headaches    Objective:    BP 125/90 (BP Location: Right Arm, Patient Position: Sitting, Cuff Size: Large)   Pulse 100   Temp 99.7 F (37.6 C) (Oral)   Resp 20   Wt 255 lb (115.7 kg)   SpO2 97%   BMI 43.77 kg/m  General appearance: alert, cooperative, fatigued and no distress Head: Normocephalic, without obvious abnormality, atraumatic Eyes: conjunctivae/corneas clear. PERRL, EOM's intact. Fundi benign. Ears: normal TM's and external ear canals both ears Nose: no discharge, moderate congestion, no sinus tenderness Throat: abnormal findings: moderate  oropharyngeal erythema and oropharyngeal edema, no exudates Lungs: clear to auscultation bilaterally Heart: regular rate and rhythm, S1, S2 normal, no murmur, click, rub or gallop Abdomen: soft, non-tender; bowel sounds normal; no masses,  no organomegaly Pulses: 2+ and symmetric Skin: Skin color, texture, turgor normal. No rashes or lesions Lymph nodes: left cervical lymphadenopathy Neurologic: Grossly normal  Laboratory Strep test done. Results:negative.   Influenza A/B: negative   Assessment:   Acute Upper Respiratory Infection   Plan:   Exam findings, diagnosis etiology and medication use and indications reviewed with patient. Follow- Up and discharge instructions provided. No emergent/urgent issues found on exam. Patient education was provided. Patient verbalized understanding of information provided and agrees with plan of care (POC), all questions answered. The patient is advised to call or return to clinic if condition does not see an improvement in symptoms, or to seek the care of the closest emergency department if condition worsens with the above plan.   1. Acute upper respiratory infection  - amoxicillin (AMOXIL) 875 MG tablet; Take 1 tablet (875 mg total) by mouth 2 (two) times daily for 10 days.  Dispense: 20 tablet; Refill: 0 -Take medications as prescribed. -Ibuprofen 800 mg every 8 hours for the next 3 days.  May stop sooner if symptoms improve. -Warm salt water gargles 3-4 times daily for throat discomfort. -May use a teaspoon of honey as needed for sore throat. -Use a humidifier or vaporizer when at home and during sleep. -Increase fluids. -Rest as much as possible. - Follow-up if symptoms do not improve within the next 3 to 5 days.   2. Flu-like symptoms  - POCT Influenza A/B-negative  3. Sore throat  - POCT rapid strep A-negative

## 2018-12-14 MED FILL — ETONOGESTREL-ETHINYL ESTRAD: 0.12-0.015 | 28 days supply | Qty: 1 | Fill #0

## 2018-12-18 NOTE — Progress Notes (Addendum)
[facility] [address] [place], [place] [address] [phone]  Date:  12/21/2018   Name:  Stephanie Long   DOB:  [birth year]   MRN:  [id number]  PCP:  Pearline Cables, MD    Chief Complaint: Gastroesophageal Reflux (worsening, nausea, something stuck in throat, chest discomfort, sharp sternal pain)   History of Present Illness:  Stephanie Long is a 43 y.o. very pleasant female patient who presents with the following:  History of mild persistent asthma, obesity.  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 pylori for her last year  Today she notes more severe sx of presumed reflux again for the last few weeks.  She notes that abou 2 months ago while driving she had an episode of chest pain and sweating.   Thought it was reflux, she took T him on [place] and and it got better She also had a FB sensation that has recurred a couple of times recently.  Felt like splint was stuck in her throat, although she did not think anything was actually caught.  She is still able to swallow and eat okay Most recent episode of CP about one week ago while she was at work -this was fairly severe, felt sharp.  It resolved and is not present currently  We did an ETT for her last May; she did fine The patient exercised following the Bruce protocol.  The patient reported shortness of breath during the stress test. The patient experienced no angina  during the stress test.  The test was stopped because the patient complained of fatigue and shortness of breath. Heart rate demonstrated a normal response to exercise. Blood pressure demonstrated a hypotensive response to exercise.  85% of maximum heart rate was achieved after 6 minutes. Recovery time: 5 minutes.  Duke Treadmill Score: low risk The patient's response to exercise was adequate for diagnosis.  She notes nausea but no vomtiing  No change in her bowel habits She does not feel that her symptoms are particularly worse after eating, not worse after particular foods No fever or cough  She is taking pepcid perhaps 4x a week  She may also use some tums and gas-x prn  Never had an upper GI No family history of colon cancer She uses nuva ring for contraception, per her GYN  She got back to an exercise routine about 2 weeks ago-bike, elliptical No CP or abnl SOB with exercise noted  She is trying to get into better shape  Her blood pressure is borderline.  She is seeing her GYN.  I asked her to discuss contraceptive options with them, she might do better with a nonestrogen options such as an IUD  Wt Readings from Last 3 Encounters:  12/21/18 251 lb (113.9 kg)  08/30/18 255 lb (115.7 kg)  06/08/18 249 lb 4.8 oz (113.1 kg)    BP Readings from Last  3 Encounters:  12/21/18 138/90  08/30/18 125/90  06/08/18 (!) 155/87     Patient Active Problem List   Diagnosis Date Noted  . Right knee pain 10/09/2017  . Right foot pain 10/09/2017  . Plantar fasciitis of right foot 10/16/2016  . Right shoulder pain 10/16/2016  . Obesity 10/07/2016  . DERMATITIS, OTHER ATOPIC 03/24/2007  . Mild persistent asthma without complication 03/10/2007    Past Medical History:  Diagnosis Date  . Allergy   . Asthma   . GERD (gastroesophageal reflux disease)   . Gestational diabetes   . Gestational hypertension 05/09/2013  . Hx of varicella   . PCOS (polycystic ovarian syndrome) 2012  .  Plantar fasciitis   . Symptomatic cholelithiasis     Past Surgical History:  Procedure Laterality Date  . CESAREAN SECTION N/A 05/09/2013   Procedure: primary cesarean section with delivery of baby girl at 1800.  Apgars 9/9.;  Surgeon: Lenoard Aden, MD;  Location: [facility] ORS;  Service: Obstetrics;  Laterality: N/A;  . CHOLECYSTECTOMY N/A 06/08/2018   Procedure: LAPAROSCOPIC CHOLECYSTECTOMY;  Surgeon: Violeta Gelinas, MD;  Location: [facility] OR;  Service: General;  Laterality: N/A;  . WISDOM TOOTH EXTRACTION    . WISDOM TOOTH EXTRACTION      Social History   Tobacco Use  . Smoking status: Never Smoker  . Smokeless tobacco: Never Used  Substance Use Topics  . Alcohol use: Yes    Alcohol/week: 2.0 standard drinks    Types: 1 Glasses of wine, 1 Standard drinks or equivalent per week  . Drug use: No    Family History  Problem Relation Age of Onset  . Diabetes Father   . Heart disease Father   . Hyperlipidemia Father   . Heart attack Father   . Depression Mother   . Stroke Maternal Grandmother   . Cancer Neg Hx   . Asthma Neg Hx   . Early death Neg Hx     No Known Allergies  Medication list has been reviewed and updated.  Current Outpatient Medications on File Prior to Visit  Medication Sig Dispense Refill  . albuterol (PROVENTIL HFA;VENTOLIN HFA) 108 (90 Base) MCG/ACT inhaler Inhale 2 puffs into the lungs every 6 (six) hours as needed for wheezing or shortness of breath. 1 Inhaler 6  . calcium carbonate (TUMS - DOSED IN MG ELEMENTAL CALCIUM) 500 MG chewable tablet Chew 1 tablet by mouth daily as needed for indigestion or heartburn.     . cetirizine (ZYRTEC) 10 MG tablet Take 10 mg by mouth daily as needed.     . etonogestrel-ethinyl estradiol (NUVARING) 0.12-0.015 MG/24HR vaginal ring Place 1 each vaginally every 28 (twenty-eight) days. Insert vaginally and leave in place for 3 consecutive weeks, then remove for 1 week.    . fexofenadine (ALLEGRA) 60 MG tablet Take 60 mg by mouth  2 (two) times daily.    . Multiple Vitamin (MULTIVITAMIN) tablet Take 1 tablet by mouth daily.    Marland Kitchen oxyCODONE (OXY IR/ROXICODONE) 5 MG immediate release tablet Take 1 tablet (5 mg total) by mouth every 6 (six) hours as needed for severe pain. 20 tablet 0   No current facility-administered medications on file prior to visit.     Review of Systems:  As per HPI- otherwise negative.  No fever or chills  Physical Examination: Vitals:   12/21/18 0902 12/21/18 0919  BP: (!) 142/94 138/90  Pulse: 89   Resp: 18   Temp: 98.3 F (36.8 C)   SpO2:  98%    Vitals:   12/21/18 0902  Weight: 251 lb (113.9 kg)  Height: 5\' 4"  (1.626 m)   Body mass index is 43.08 kg/m. Ideal Body Weight: Weight in (lb) to have BMI = 25: 145.3  GEN: WDWN, NAD, Non-toxic, A & O x 3, obese, looks well HEENT: Atraumatic, Normocephalic. Neck supple. No masses, No LAD. Bilateral TM wnl, oropharynx normal.  PEERL,EOMI.   Ears and Nose: No external deformity. CV: RRR, No M/G/R. No JVD. No thrill. No extra heart sounds. PULM: CTA B, no wheezes, crackles, rhonchi. No retractions. No resp. distress. No accessory muscle use. ABD: S, ND, +BS. No rebound. No HSM.  Minimal epigastric tenderness EXTR: No c/c/e NEURO Normal gait.  PSYCH: Normally interactive. Conversant. Not depressed or anxious appearing.  Calm demeanor.   EKG: NSR, no change from previous EKG done last May  Assessment and Plan: Gastroesophageal reflux disease, esophagitis presence not specified - Plan: omeprazole (PRILOSEC) 40 MG capsule, sucralfate (CARAFATE) 1 g tablet, H. pylori breath test, Ambulatory referral to Gastroenterology  Chest discomfort - Plan: EKG 12-Lead  Encounter for contraceptive management, unspecified type - Plan: Ambulatory referral to Obstetrics / Gynecology  Borderline blood pressure  Here today with likely reflux associated chest discomfort.  EKG is reassuring She did a treadmill test less than 1 year ago which was  negative We did offer to have her see cardiology, she declines for now She has not been taking a PPI, so we will do an H. pylori breath test Plan to add omeprazole 40 mg once a day, Carafate 4 times daily, can refer to gastroenterology  She is seeing her GYN next month, it may be prudent to take her off estrogen due to borderline BP.  She will discuss this with her doctor  Shared with patient that while we do not think this is a cardiac issue, certainly if her symptoms do seem severe or otherwise worsen she will seek care  Signed Abbe AmsterdamJessica Lonzy Mato, MD   Received her H pylori 2/25 Results for orders placed or performed in visit on 12/21/18  H. pylori breath test  Result Value Ref Range   H. pylori Breath Test NOT DETECTED NOT DETECT   Message to pt

## 2018-12-21 ENCOUNTER — Ambulatory Visit (INDEPENDENT_AMBULATORY_CARE_PROVIDER_SITE_OTHER): Payer: No Typology Code available for payment source | Admitting: Family Medicine

## 2018-12-21 ENCOUNTER — Ambulatory Visit: Payer: Self-pay | Admitting: *Deleted

## 2018-12-21 ENCOUNTER — Encounter: Payer: Self-pay | Admitting: Family Medicine

## 2018-12-21 VITALS — BP 138/90 | HR 89 | Temp 98.3°F | Resp 18 | Ht 64.0 in | Wt 251.0 lb

## 2018-12-21 DIAGNOSIS — R0789 Other chest pain: Secondary | ICD-10-CM | POA: Diagnosis not present

## 2018-12-21 DIAGNOSIS — K219 Gastro-esophageal reflux disease without esophagitis: Secondary | ICD-10-CM | POA: Diagnosis not present

## 2018-12-21 DIAGNOSIS — R030 Elevated blood-pressure reading, without diagnosis of hypertension: Secondary | ICD-10-CM

## 2018-12-21 DIAGNOSIS — Z309 Encounter for contraceptive management, unspecified: Secondary | ICD-10-CM

## 2018-12-21 MED ORDER — SUCRALFATE 1 G PO TABS: 1.0000 g | ORAL_TABLET | Freq: Three times a day (TID) | ORAL | 0 refills | Status: DC

## 2018-12-21 MED ORDER — OMEPRAZOLE 40 MG PO CPDR: 40.0000 mg | DELAYED_RELEASE_CAPSULE | Freq: Every day | ORAL | 3 refills | Status: DC

## 2018-12-21 MED FILL — SUCRALFATE 1 GM TABLET: 1 | 10 days supply | Qty: 40 | Fill #0

## 2018-12-21 MED FILL — OMEPRAZOLE 40 MG CPDR: 40 | 30 days supply | Qty: 30 | Fill #0

## 2018-12-21 NOTE — Telephone Encounter (Signed)
Pt saw Dr. Patsy Lager today 'reflux, esophagitis.' States when at practice she was only nauseated which was reported to Dr. Patsy Lager. Calling to report since OV she has had 2 episodes of vomiting. "Not much, a little undigested food." Also states belching a lot and still has feeling as if something is stuck in her throat. Afebrile. States nausea resolved briefly after vomiting episodes. States is staying hydrated. Wanted to let Dr. Patsy Lager know "In case this changes anything." Assured pt TN would route  to practice. Instructed to CB if symptoms worsen. CB: [phone]  Reason for Disposition . [1] Caller requesting NON-URGENT health information AND [2] PCP's office is the best resource  Answer Assessment - Initial Assessment Questions 1. REASON FOR CALL or QUESTION: "What is your reason for calling today?" or "How can I best help you?" or "What question do you have that I can help answer?"     "Want to let Dr. Patsy Lager know I'm vomiting now."  Protocols used: INFORMATION ONLY CALL-A-AH

## 2018-12-21 NOTE — Patient Instructions (Signed)
It was great to see you today We will do an H pylori screen for you today Change to omeprazole once a day carafate with meals and at bedtime I will refer you to GI for a consultation as well  Your BP is borderline  BP Readings from Last 3 Encounters:  12/21/18 138/90  08/30/18 125/90  06/08/18 (!) 155/87   Please discuss with your GYN next month- they may want to take you off estrogen- ? IUD   We don't think that this is a hear issue However if you continue to have any unusual or concerning symptoms please do seek care!

## 2018-12-22 ENCOUNTER — Encounter: Payer: Self-pay | Admitting: Family Medicine

## 2018-12-22 LAB — H. PYLORI BREATH TEST: H. PYLORI BREATH TEST: NOT DETECTED

## 2018-12-22 NOTE — Telephone Encounter (Signed)
Called her back- LMOM Will send her a mychart message

## 2018-12-30 ENCOUNTER — Encounter: Payer: Self-pay | Admitting: Gastroenterology

## 2018-12-30 ENCOUNTER — Ambulatory Visit: Payer: No Typology Code available for payment source | Admitting: Gastroenterology

## 2018-12-30 VITALS — BP 134/86 | HR 79 | Ht 64.0 in | Wt 251.2 lb

## 2018-12-30 DIAGNOSIS — R12 Heartburn: Secondary | ICD-10-CM | POA: Diagnosis not present

## 2018-12-30 DIAGNOSIS — R1310 Dysphagia, unspecified: Secondary | ICD-10-CM | POA: Diagnosis not present

## 2018-12-30 DIAGNOSIS — R0789 Other chest pain: Secondary | ICD-10-CM

## 2018-12-30 NOTE — Progress Notes (Signed)
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  She endorses intermittent reflux characterized by CP, pyrosis, intermittent globus sensation.  Has been having CP for approx 1 year, evaluated by her PCM previously with EKG and treadmill study (both negative/normal). Also with HB and regurgitation for a few years prior to that. Had been treating with OTC Tums, then Zantac starting in 2019  which she stopped 2/2 recall.  Reflux symptoms have worsened in the last few months.  Was taking Pepcid 4 times per week along with  OTC Tums. Sxs worse since ccy in 2019. Intermittent dysphagia and globus sensation in the last year or so as well.  No history of food impactions.  Aside from work-related, no identified exacerbating factors.  She was seen by her PCM for this issue on 12/21/2018.  H. pylori breath test (-) and started on Prilosec 40 mg daily and Carafate with referral to GI for further evaluation.  Today she states her symptoms have improved with the Prilosec/Carafate.  She is otherwise in her usual state of health and without any additional GI complaints.  No previous EGD or colonoscopy  No known family history of CRC, GI malignancy, liver disease, pancreatic disease, or IBD.   Past Medical History:  Diagnosis Date  . Allergy   . Asthma   . GERD (gastroesophageal reflux disease)   . Gestational diabetes   . Gestational hypertension 05/09/2013  . Hx of varicella   . PCOS (polycystic ovarian syndrome) 2012  . Plantar fasciitis   . Symptomatic cholelithiasis      Past Surgical History:  Procedure Laterality Date  . CESAREAN SECTION N/A 05/09/2013   Procedure: primary cesarean section with delivery of baby girl at 1800.  Apgars  9/9.;  Surgeon: Lenoard Aden, MD;  Location: [facility];  Service: Obstetrics;  Laterality: N/A;  . CHOLECYSTECTOMY N/A 06/08/2018   Procedure: LAPAROSCOPIC CHOLECYSTECTOMY;  Surgeon: Violeta Gelinas, MD;  Location: [facility] OR;  Service: General;  Laterality: N/A;  . WISDOM TOOTH EXTRACTION    . WISDOM TOOTH EXTRACTION     Family History  Problem Relation Age of Onset  . Diabetes Father   . Heart disease Father   . Hyperlipidemia Father   . Heart attack Father   . Depression Mother   . Stroke Maternal Grandmother   . Cancer Neg Hx   . Asthma Neg Hx   . Early death Neg Hx    Social History   Tobacco Use  . Smoking status: Never Smoker  . Smokeless tobacco: Never Used  Substance Use Topics  . Alcohol use: Yes    Alcohol/week: 2.0 standard drinks    Types: 1 Glasses of wine, 1 Standard drinks or equivalent per week  . Drug use: No   Current Outpatient Medications  Medication Sig Dispense Refill  . Acetaminophen (TYLENOL PO) Take 1 tablet by mouth as needed.    Marland Kitchen albuterol (PROVENTIL HFA;VENTOLIN HFA) 108 (90 Base) MCG/ACT inhaler Inhale 2 puffs into the lungs every 6 (six) hours as needed for wheezing or shortness of breath. 1 Inhaler 6  . calcium carbonate (TUMS - DOSED IN MG ELEMENTAL CALCIUM) 500 MG chewable  tablet Chew 1 tablet by mouth daily as needed for indigestion or heartburn.     . cetirizine (ZYRTEC) 10 MG tablet Take 10 mg by mouth daily as needed.     . etonogestrel-ethinyl estradiol (NUVARING) 0.12-0.015 MG/24HR vaginal ring Place 1 each vaginally every 28 (twenty-eight) days. Insert vaginally and leave in place for 3 consecutive weeks, then remove for 1 week.    . fexofenadine (ALLEGRA) 60 MG tablet Take 60 mg by mouth as needed (alternate with zyrtec).     . IBUPROFEN PO Take 1 tablet by mouth as needed.    . Multiple Vitamin (MULTIVITAMIN) tablet Take 1 tablet by mouth daily.    Marland Kitchen omeprazole (PRILOSEC) 40 MG capsule Take 1 capsule (40 mg total) by mouth daily. 30  capsule 3  . sucralfate (CARAFATE) 1 g tablet Take 1 tablet (1 g total) by mouth 4 (four) times daily -  with meals and at bedtime. 40 tablet 0   No current facility-administered medications for this visit.    No Known Allergies   Review of Systems: All systems reviewed and negative except where noted in HPI.     Physical Exam:    Wt Readings from Last 3 Encounters:  12/30/18 251 lb 4 oz (114 kg)  12/21/18 251 lb (113.9 kg)  08/30/18 255 lb (115.7 kg)    BP 134/86   Pulse 79   Ht 5\' 4"  (1.626 m)   Wt 251 lb 4 oz (114 kg)   BMI 43.13 kg/m  Constitutional:  Pleasant, in no acute distress. Psychiatric: Normal mood and affect. Behavior is normal. EENT: Pupils normal.  Conjunctivae are normal. No scleral icterus. Neck supple. No cervical LAD. Cardiovascular: Normal rate, regular rhythm. No edema Pulmonary/chest: Effort normal and breath sounds normal. No wheezing, rales or rhonchi. Abdominal: Soft, nondistended, nontender. Bowel sounds active throughout. There are no masses palpable. No hepatomegaly. Neurological: Alert and oriented to person place and time. Skin: Skin is warm and dry. No rashes noted.   ASSESSMENT AND PLAN;   Stephanie Long is a 43 y.o. female presenting with:  1) Noncardiac chest pain: Discussed common etiologies for NCCP, to include reflux.  Her clinical response to an appropriate course of aspiration therapy is highly suggestive of reflux as underlying etiology.  -Resume Prilosec as prescribed - Evaluate for LES laxity, hiatal hernia, erosive esophagitis at time of EGD -Did discuss described literature regarding worsening of reflux symptoms after ccy, particularly with anatomic defects (i.e. hiatal hernia, LES laxity, etc.)  2) Dysphagia: Discussed DDX and will evaluate further as below:  -EGD with dilation and/or esophageal biopsies as indicated -Evaluate for continued clinical improvement with current trial of PPI  3)  Heartburn: -Evaluate for erosive esophagitis along with anatomic defect at time of EGD as above  The indications, risks, and benefits of EGD were explained to the patient in detail. Risks include but are not limited to bleeding, perforation, adverse reaction to medications, and cardiopulmonary compromise. Sequelae include but are not limited to the possibility of surgery, hositalization, and mortality. The patient verbalized understanding and wished to proceed. All questions answered, referred to scheduler. Further recommendations pending results of the exam.    Shellia Cleverly, DO, FACG  12/30/2018, 10:47 AM   Copland, Gwenlyn Found, MD

## 2018-12-30 NOTE — Addendum Note (Signed)
Addended by: Oliver Barre D on: 12/30/2018 01:49 PM   Modules accepted: Orders

## 2018-12-30 NOTE — Patient Instructions (Signed)
If you are age 41 or older, your body mass index should be between 23-30. Your Body mass index is 43.13 kg/m. If this is out of the aforementioned range listed, please consider follow up with your Primary Care Provider.  If you are age 54 or younger, your body mass index should be between 19-25. Your Body mass index is 43.13 kg/m. If this is out of the aformentioned range listed, please consider follow up with your Primary Care Provider.   You have been scheduled for an endoscopy. Please follow written instructions given to you at your visit today. If you use inhalers (even only as needed), please bring them with you on the day of your procedure. Your physician has requested that you go to www.startemmi.com and enter the access code given to you at your visit today. This web site gives a general overview about your procedure. However, you should still follow specific instructions given to you by our office regarding your preparation for the procedure.  It was a pleasure to see you today!  Vito Cirigliano, D.O.

## 2019-01-11 ENCOUNTER — Telehealth: Payer: Self-pay | Admitting: Gastroenterology

## 2019-01-11 NOTE — Telephone Encounter (Signed)
Hi Dr. Barron Alvine, this pt just cancelled her procedure that was scheduled with you tomorrow because since schools are closed she does not have anybody to watch her daughter. She will call back to reschedule. Thank you.

## 2019-01-11 NOTE — Telephone Encounter (Signed)
Understood. Thank you.

## 2019-01-12 ENCOUNTER — Encounter: Payer: No Typology Code available for payment source | Admitting: Gastroenterology

## 2019-02-05 ENCOUNTER — Other Ambulatory Visit: Payer: Self-pay | Admitting: Family Medicine

## 2019-02-05 ENCOUNTER — Encounter: Payer: Self-pay | Admitting: Family Medicine

## 2019-02-11 ENCOUNTER — Encounter: Payer: Self-pay | Admitting: Sports Medicine

## 2019-02-11 ENCOUNTER — Other Ambulatory Visit: Payer: Self-pay

## 2019-02-11 ENCOUNTER — Ambulatory Visit (INDEPENDENT_AMBULATORY_CARE_PROVIDER_SITE_OTHER): Payer: No Typology Code available for payment source | Admitting: Sports Medicine

## 2019-02-11 VITALS — Temp 97.7°F | Ht 64.0 in | Wt 250.0 lb

## 2019-02-11 DIAGNOSIS — M25561 Pain in right knee: Secondary | ICD-10-CM | POA: Diagnosis not present

## 2019-02-11 MED ORDER — MELOXICAM 7.5 MG PO TABS: ORAL_TABLET | ORAL | 1 refills | Status: DC

## 2019-02-11 MED FILL — MELOXICAM 7.5 MG TABLET: 7.5 | 30 days supply | Qty: 30 | Fill #0

## 2019-02-11 NOTE — Progress Notes (Addendum)
  Stephanie Long - 43 y.o. female MRN [id number]  Date of birth: 13-Jun-1976  Telemedicine Visit   Patient consented to have virtual visit. Pt verified by name and DOB. Method of visit: Video visit   Encounter participants: Patient: Stephanie Long - located at home Provider: Dalbert Garnet, DO - located at [facility] Supervising physician available for consultation: Dr. Frazier Butt, DO   SUBJECTIVE:      Chief Complaint: Right knee pain  HPI:  43 year old female with 2 weeks of 2-7/10 knee pain.  Patient states she was doing physical education at home with her daughter and doing jumping jacks at the time of injury.  She states feeling pain and her right knee giving out.  She denies any feeling or hearing a pop.  She denies any following swelling.  She localizes her knee pain to the anterior medial joint.  She has been wearing a compression knee brace which is somewhat beneficial for her while she is at work.  She has been taking Tylenol and Motrin as needed which helps.  Her pain is worse if she performs actions that cause her to internally or externally rotate the tibia.  Stairs are slightly uncomfortable.  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  She does note occasional stiffness upon initiating movement.  She denies any giving out or locking.  She denies any effusion, erythema, swelling, fever or chills.  No skin changes.  No distal numbness or tingling   ROS:     See HPI. All other reviewed systems negative.  PERTINENT  PMH / PSH FH / / SH:  Past Medical, Surgical, Social, and Family History Reviewed & Updated in the EMR.  Pertinent findings include:  GERD  OBJECTIVE: Temp 97.7 F (36.5 C)   Ht 5\' 4"  (1.626 m)   Wt 250 lb (113.4 kg)   BMI 42.91 kg/m   Physical Exam:  Vital signs are reviewed.  GEN: Alert and oriented, NAD Pulm: Breathing unlabored PSY: normal mood, congruent affect  MSK: The knee was  visually inspected via WebCam.  There is no obvious effusion or erythema.  No ecchymosis.   Patient able to demonstrate full extension and flexion of the knee Medial pain reproduced with Thessaly's   ASSESSMENT & PLAN:  1.  Right knee pain-based on history and exam pain likely due to meniscal injury versus arthritis flare.  Since she is not having mechanical symptoms, the treatment of either would be the same initially. - Mobic 7.5 mg daily.  Patient vies to take with food.  Caution advised as she does have underlying GERD -Continue ice -Continue knee sleeve as needed -Patient was emailed rehabilitation exercises to focus on quad strength -Plan to follow-up in about 4 weeks or sooner if worsening.  If not improving, consider x-ray to evaluate for degree of arthritis and possible MRI.    Total time spent on this encounter: 25 minutes.  Patient seen and evaluated with the sports medicine fellow.  I agree with the above plan of care.  Physical exam is somewhat limited due to the nature of today's visit.  Proceed with treatment as above and follow-up in 4 weeks.  If symptoms persist or worsen consider merits of x-ray before deciding on further treatment.

## 2019-03-04 ENCOUNTER — Encounter: Payer: No Typology Code available for payment source | Admitting: Family Medicine

## 2019-03-24 ENCOUNTER — Other Ambulatory Visit: Payer: Self-pay | Admitting: Family Medicine

## 2019-03-24 DIAGNOSIS — J4531 Mild persistent asthma with (acute) exacerbation: Secondary | ICD-10-CM

## 2019-03-24 MED FILL — OMEPRAZOLE 40 MG CPDR: 40 | 30 days supply | Qty: 30 | Fill #1

## 2019-03-24 MED FILL — ALBUTEROL SULFATE HFA 108 (: 108 (90 BAS | 25 days supply | Qty: 18 | Fill #0

## 2019-05-03 NOTE — Progress Notes (Addendum)
[facility] [address], [address] [place], [place] [address] [phone] [phone]  Date:  05/05/2019   Name:  Stephanie LabrumJennifer B Sherrin   DOB:  1976/09/09   MRN:  [id number]  PCP:  Pearline Cablesopland, Jessica C, MD    Chief Complaint: Annual Exam (sees gyn for pap)   History of Present Illness:  Stephanie Long is a 43 y.o. 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  She will mostly study online, will need to go to [employer] a few times Needs a form completed for school today  We did an exercise treadmill for her last year, she did fine  Married to [place], has a 41 year old daughter   Pap: per DR [facility]ly [facility]ins at [facility]: Did baseline in 2015, due this year- done at GYN office back in Feb of this year  She has an IUD now, stopped nuva ring Immunizations: Tdap- I don't have this date, but it should be UTD>  She will try and find this date Labs: Now due- she is fasting this am   Her GERD is better since she started prilosec She had planned to do an UGI but this got delayed due to pandemic- right now her sx are improved so she is not sure if needed   She has noted right knee and hip pain off an on for a year or so, NKI She occasionally has tingling or numbness down her right leg  No bowel or bladder incont   BP Readings from Last 3 Encounters:  05/05/19 138/88  12/30/18 134/86  12/21/18 138/90   Wt Readings from Last 3 Encounters:  05/05/19 251 lb (113.9 kg)  02/11/19 250 lb (113.4 kg)  12/30/18 251 lb 4 oz (114 kg)      Patient Active Problem List   Diagnosis Date Noted  . Right knee pain 10/09/2017  . Right foot pain 10/09/2017  . Plantar fasciitis of right foot 10/16/2016  . Right shoulder pain 10/16/2016  . Obesity 10/07/2016  . DERMATITIS, OTHER ATOPIC 03/24/2007  . Mild persistent asthma without complication 03/10/2007    Past Medical History:  Diagnosis Date  . Allergy   . Asthma   . GERD (gastroesophageal reflux disease)   . Gestational diabetes   . Gestational hypertension 05/09/2013  . Hx of varicella   . PCOS (polycystic ovarian syndrome) 2012  . Plantar fasciitis   . Symptomatic cholelithiasis     Past Surgical History:  Procedure Laterality Date  . CESAREAN SECTION N/A 05/09/2013   Procedure: primary cesarean section with delivery of baby girl at 1800.  Apgars 9/9.;  Surgeon: Lenoard Adenichard J Taavon, MD;  Location: [facility];  Service: Obstetrics;  Laterality: N/A;  . CHOLECYSTECTOMY N/A 06/08/2018   Procedure: LAPAROSCOPIC CHOLECYSTECTOMY;  Surgeon: Violeta Gelinashompson, Burke, MD;  [facility];  Service: General;  Laterality: N/A;  . WISDOM TOOTH EXTRACTION    . WISDOM TOOTH EXTRACTION      Social History   Tobacco Use  . Smoking status: Never Smoker  . Smokeless tobacco:  Never Used  Substance Use Topics  . Alcohol use: Yes    Alcohol/week: 2.0 standard drinks    Types: 1 Glasses of wine, 1 Standard drinks or equivalent per week  . Drug use: No    Family History  Problem Relation Age of Onset  . Diabetes Father   . Heart disease Father   . Hyperlipidemia Father   . Heart attack Father   . Depression Mother   . Stroke Maternal Grandmother   . Cancer Neg Hx   . Asthma Neg Hx   . Early death Neg Hx     No Known Allergies  Medication list has been reviewed and updated.  Current Outpatient Medications on File Prior to Visit  Medication Sig Dispense Refill  . Acetaminophen (TYLENOL PO) Take 1 tablet by mouth as needed.    . calcium carbonate (TUMS - DOSED IN MG ELEMENTAL  CALCIUM) 500 MG chewable tablet Chew 1 tablet by mouth daily as needed for indigestion or heartburn.     . cetirizine (ZYRTEC) 10 MG tablet Take 10 mg by mouth daily as needed.     . fexofenadine (ALLEGRA) 60 MG tablet Take 60 mg by mouth as needed (alternate with zyrtec).     . IBUPROFEN PO Take 1 tablet by mouth as needed.    . meloxicam (MOBIC) 7.5 MG tablet Take 1 tablet by mouth with food daily. 30 tablet 1  . Multiple Vitamin (MULTIVITAMIN) tablet Take 1 tablet by mouth daily.    Marland Kitchen omeprazole (PRILOSEC) 40 MG capsule Take 1 capsule (40 mg total) by mouth daily. 30 capsule 3  . sucralfate (CARAFATE) 1 g tablet Take 1 tablet (1 g total) by mouth 4 (four) times daily -  with meals and at bedtime. 40 tablet 0  . VENTOLIN HFA 108 (90 Base) MCG/ACT inhaler INHALE 2 PUFFS INTO THE LUNGS EVERY 6 HOURS AS NEEDED FOR WHEEZING OR SHORTNESS OF BREATH. 18 g 6   No current facility-administered medications on file prior to visit.     Review of Systems:  As per HPI- otherwise negative.   Physical Examination: Vitals:   05/05/19 0903  BP: 138/88  Pulse: 75  Resp: 16  Temp: 98.1 F (36.7 C)  SpO2: 97%   Vitals:   05/05/19 0903  Weight: 251 lb (113.9 kg)  Height: 5\' 4"  (1.626 m)   Body mass index is 43.08 kg/m. Ideal Body Weight: Weight in (lb) to have BMI = 25: 145.3  GEN: WDWN, NAD, Non-toxic, A & O x 3, obese, looks well  HEENT: Atraumatic, Normocephalic. Neck supple. No masses, No LAD.  TM wnl Ears and Nose: No external deformity. CV: RRR, No M/G/R. No JVD. No thrill. No extra heart sounds. PULM: CTA B, no wheezes, crackles, rhonchi. No retractions. No resp. distress. No accessory muscle use. ABD: S, NT, ND, +BS. No rebound. No HSM. EXTR: No c/c/e NEURO Normal gait.  PSYCH: Normally interactive. Conversant. Not depressed or anxious appearing.  Calm demeanor.  Normal strength, sensation and DTR of all limbs, no saddle anesthesia    Assessment and Plan:   ICD-10-CM   1.  Physical exam  Z00.00   2. Screening for diabetes mellitus  Z13.1 Comprehensive metabolic panel    Hemoglobin A1c  3. Mild persistent asthma with acute exacerbation  J45.31   4. Screening for deficiency anemia  Z13.0 CBC  5. Screening for hyperlipidemia  Z13.220 Lipid panel  6. Chronic pain of right knee  M25.561 Ambulatory referral to Orthopedic  Surgery   G89.29   7. Class 3 severe obesity due to excess calories without serious comorbidity with body mass index (BMI) of 40.0 to 44.9 in adult [facility](HCC)  E66.01    Z68.41    PE today Referral to ortho for her chronic right knee pain and likely resultant hip pain  Asked her to monitor BP at home Encouraged weight loss Will plan further follow- up pending labs. She will try and get her Tdap date from gyn or [place]   Follow-up: No follow-ups on file.  No orders of the defined types were placed in this encounter.  Orders Placed This Encounter  Procedures  . CBC  . Comprehensive metabolic panel  . Hemoglobin A1c  . Lipid panel  . Ambulatory referral to Orthopedic Surgery     Signed Abbe AmsterdamJessica Copland, MD  Received her labs, message to pt  Results for orders placed or performed in visit on 05/05/19  CBC  Result Value Ref Range   WBC 9.6 4.0 - 10.5 K/uL   RBC 4.65 3.87 - 5.11 Mil/uL   Platelets 292.0 150.0 - 400.0 K/uL   Hemoglobin 13.1 12.0 - 15.0 g/dL   HCT 13.039.6 86.536.0 - 78.446.0 %   MCV 85.2 78.0 - 100.0 fl   MCHC 33.1 30.0 - 36.0 g/dL   RDW 69.613.9 29.511.5 - 28.415.5 %  Comprehensive metabolic panel  Result Value Ref Range   Sodium 139 135 - 145 mEq/L   Potassium 3.9 3.5 - 5.1 mEq/L   Chloride 106 96 - 112 mEq/L   CO2 26 19 - 32 mEq/L   Glucose, Bld 89 70 - 99 mg/dL   BUN 12 6 - 23 mg/dL   Creatinine, Ser 1.320.79 0.40 - 1.20 mg/dL   Total Bilirubin 0.5 0.2 - 1.2 mg/dL   Alkaline Phosphatase 89 39 - 117 U/L   AST 15 0 - 37 U/L   ALT 12 0 - 35 U/L   Total Protein 6.8 6.0 - 8.3 g/dL   Albumin 4.1 3.5 - 5.2 g/dL   Calcium 8.6 8.4 -  44.010.5 mg/dL   GFR 10.2780.34 >25.36>60.00 mL/min  Hemoglobin A1c  Result Value Ref Range   Hgb A1c MFr Bld 5.5 4.6 - 6.5 %  Lipid panel  Result Value Ref Range   Cholesterol 115 0 - 200 mg/dL   Triglycerides 64.463.0 0.0 - 149.0 mg/dL   HDL 03.4744.90 >42.59>39.00 mg/dL   VLDL 56.312.6 0.0 - 87.540.0 mg/dL   LDL Cholesterol 58 0 - 99 mg/dL   Total CHOL/HDL Ratio 3    NonHDL 70.17

## 2019-05-05 ENCOUNTER — Ambulatory Visit (INDEPENDENT_AMBULATORY_CARE_PROVIDER_SITE_OTHER): Payer: No Typology Code available for payment source | Admitting: Family Medicine

## 2019-05-05 ENCOUNTER — Encounter: Payer: Self-pay | Admitting: Family Medicine

## 2019-05-05 ENCOUNTER — Other Ambulatory Visit: Payer: Self-pay

## 2019-05-05 VITALS — BP 138/88 | HR 75 | Temp 98.1°F | Resp 16 | Ht 64.0 in | Wt 251.0 lb

## 2019-05-05 DIAGNOSIS — J4531 Mild persistent asthma with (acute) exacerbation: Secondary | ICD-10-CM | POA: Diagnosis not present

## 2019-05-05 DIAGNOSIS — Z0000 Encounter for general adult medical examination without abnormal findings: Secondary | ICD-10-CM

## 2019-05-05 DIAGNOSIS — Z130 Encounter for screening for diseases of the blood and blood-forming organs and certain disorders involving the immune mechanism: Secondary | ICD-10-CM

## 2019-05-05 DIAGNOSIS — M25561 Pain in right knee: Secondary | ICD-10-CM

## 2019-05-05 DIAGNOSIS — G8929 Other chronic pain: Secondary | ICD-10-CM

## 2019-05-05 DIAGNOSIS — Z131 Encounter for screening for diabetes mellitus: Secondary | ICD-10-CM

## 2019-05-05 DIAGNOSIS — Z13220 Encounter for screening for lipoid disorders: Secondary | ICD-10-CM

## 2019-05-05 DIAGNOSIS — Z6841 Body Mass Index (BMI) 40.0 and over, adult: Secondary | ICD-10-CM

## 2019-05-05 LAB — COMPREHENSIVE METABOLIC PANEL
ALT: 12 U/L (ref 0–35)
AST: 15 U/L (ref 0–37)
Albumin: 4.1 g/dL (ref 3.5–5.2)
Alkaline Phosphatase: 89 U/L (ref 39–117)
BUN: 12 mg/dL (ref 6–23)
CO2: 26 mEq/L (ref 19–32)
Calcium: 8.6 mg/dL (ref 8.4–10.5)
Chloride: 106 mEq/L (ref 96–112)
Creatinine, Ser: 0.79 mg/dL (ref 0.40–1.20)
GFR: 80.34 mL/min (ref >60.00)
Glucose, Bld: 89 mg/dL (ref 70–99)
Potassium: 3.9 mEq/L (ref 3.5–5.1)
Sodium: 139 mEq/L (ref 135–145)
Total Bilirubin: 0.5 mg/dL (ref 0.2–1.2)
Total Protein: 6.8 g/dL (ref 6.0–8.3)

## 2019-05-05 LAB — LIPID PANEL
Cholesterol: 115 mg/dL (ref 0–200)
HDL: 44.9 mg/dL (ref >39.00)
LDL Cholesterol: 58 mg/dL (ref 0–99)
NonHDL: 70.17
Total CHOL/HDL Ratio: 3
Triglycerides: 63 mg/dL (ref 0.0–149.0)
VLDL: 12.6 mg/dL (ref 0.0–40.0)

## 2019-05-05 LAB — CBC
HCT: 39.6 % (ref 36.0–46.0)
Hemoglobin: 13.1 g/dL (ref 12.0–15.0)
MCHC: 33.1 g/dL (ref 30.0–36.0)
MCV: 85.2 fl (ref 78.0–100.0)
Platelets: 292 10*3/uL (ref 150.0–400.0)
RBC: 4.65 Mil/uL (ref 3.87–5.11)
RDW: 13.9 % (ref 11.5–15.5)
WBC: 9.6 10*3/uL (ref 4.0–10.5)

## 2019-05-05 LAB — HEMOGLOBIN A1C: Hgb A1c MFr Bld: 5.5 % (ref 4.6–6.5)

## 2019-05-05 NOTE — Patient Instructions (Signed)
Good to see you today-  I will be in touch with your labs asap Please monitor your BP at home/ work and let me know if you start running higher than 140/90 I will set you up for an orthopedics consult to look at your knee and hip  I would recommend a program of gradual weight loss - this will help keep your joints healthy and prevent cardiovascular disease    Take care!    Health Maintenance, Female Adopting a healthy lifestyle and getting preventive care are important in promoting health and wellness. Ask your health care provider about:  The right schedule for you to have regular tests and exams.  Things you can do on your own to prevent diseases and keep yourself healthy. What should I know about diet, weight, and exercise? Eat a healthy diet   Eat a diet that includes plenty of vegetables, fruits, low-fat dairy products, and lean protein.  Do not eat a lot of foods that are high in solid fats, added sugars, or sodium. Maintain a healthy weight Body mass index (BMI) is used to identify weight problems. It estimates body fat based on height and weight. Your health care provider can help determine your BMI and help you achieve or maintain a healthy weight. Get regular exercise Get regular exercise. This is one of the most important things you can do for your health. Most adults should:  Exercise for at least 150 minutes each week. The exercise should increase your heart rate and make you sweat (moderate-intensity exercise).  Do strengthening exercises at least twice a week. This is in addition to the moderate-intensity exercise.  Spend less time sitting. Even light physical activity can be beneficial. Watch cholesterol and blood lipids Have your blood tested for lipids and cholesterol at 41 years of age, then have this test every 5 years. Have your cholesterol levels checked more often if:  Your lipid or cholesterol levels are high.  You are older than 41 years of age.  You  are at high risk for heart disease. What should I know about cancer screening? Depending on your health history and family history, you may need to have cancer screening at various ages. This may include screening for:  Breast cancer.  Cervical cancer.  Colorectal cancer.  Skin cancer.  Lung cancer. What should I know about heart disease, diabetes, and high blood pressure? Blood pressure and heart disease  High blood pressure causes heart disease and increases the risk of stroke. This is more likely to develop in people who have high blood pressure readings, are of African descent, or are overweight.  Have your blood pressure checked: ? Every 3-5 years if you are 4618-41 years of age. ? Every year if you are 566 years old or older. Diabetes Have regular diabetes screenings. This checks your fasting blood sugar level. Have the screening done:  Once every three years after age 41 if you are at a normal weight and have a low risk for diabetes.  More often and at a younger age if you are overweight or have a high risk for diabetes. What should I know about preventing infection? Hepatitis B If you have a higher risk for hepatitis B, you should be screened for this virus. Talk with your health care provider to find out if you are at risk for hepatitis B infection. Hepatitis C Testing is recommended for:  Everyone born from [birth year]1945 through 1965.  Anyone with known risk factors for hepatitis C. Sexually  transmitted infections (STIs)  Get screened for STIs, including gonorrhea and chlamydia, if: ? You are sexually active and are younger than 41 years of age. ? You are older than 41 years of age and your health care provider tells you that you are at risk for this type of infection. ? Your sexual activity has changed since you were last screened, and you are at increased risk for chlamydia or gonorrhea. Ask your health care provider if you are at risk.  Ask your health care provider about  whether you are at high risk for HIV. Your health care provider may recommend a prescription medicine to help prevent HIV infection. If you choose to take medicine to prevent HIV, you should first get tested for HIV. You should then be tested every 3 months for as long as you are taking the medicine. Pregnancy  If you are about to stop having your period (premenopausal) and you may become pregnant, seek counseling before you get pregnant.  Take 400 to 800 micrograms (mcg) of folic acid every day if you become pregnant.  Ask for birth control (contraception) if you want to prevent pregnancy. Osteoporosis and menopause Osteoporosis is a disease in which the bones lose minerals and strength with aging. This can result in bone fractures. If you are 48 years old or older, or if you are at risk for osteoporosis and fractures, ask your health care provider if you should:  Be screened for bone loss.  Take a calcium or vitamin D supplement to lower your risk of fractures.  Be given hormone replacement therapy (HRT) to treat symptoms of menopause. Follow these instructions at home: Lifestyle  Do not use any products that contain nicotine or tobacco, such as cigarettes, e-cigarettes, and chewing tobacco. If you need help quitting, ask your health care provider.  Do not use street drugs.  Do not share needles.  Ask your health care provider for help if you need support or information about quitting drugs. Alcohol use  Do not drink alcohol if: ? Your health care provider tells you not to drink. ? You are pregnant, may be pregnant, or are planning to become pregnant.  If you drink alcohol: ? Limit how much you use to 0-1 drink a day. ? Limit intake if you are breastfeeding.  Be aware of how much alcohol is in your drink. In the [place] one drink equals one 12 oz bottle of beer (355 mL), one 5 oz glass of wine (148 mL), or one 1 oz glass of hard liquor (44 mL). General instructions  Schedule  regular health, dental, and eye exams.  Stay current with your vaccines.  Tell your health care provider if: ? You often feel depressed. ? You have ever been abused or do not feel safe at home. Summary  Adopting a healthy lifestyle and getting preventive care are important in promoting health and wellness.  Follow your health care provider's instructions about healthy diet, exercising, and getting tested or screened for diseases.  Follow your health care provider's instructions on monitoring your cholesterol and blood pressure. This information is not intended to replace advice given to you by your health care provider. Make sure you discuss any questions you have with your health care provider. Document Released: 04/29/2011 Document Revised: 10/07/2018 Document Reviewed: 10/07/2018 Elsevier Patient Education  2020 [employer].

## 2019-05-18 ENCOUNTER — Encounter: Payer: Self-pay | Admitting: Family Medicine

## 2019-05-18 DIAGNOSIS — Z7689 Persons encountering health services in other specified circumstances: Secondary | ICD-10-CM

## 2019-05-18 DIAGNOSIS — Z23 Encounter for immunization: Secondary | ICD-10-CM

## 2019-05-19 ENCOUNTER — Ambulatory Visit (INDEPENDENT_AMBULATORY_CARE_PROVIDER_SITE_OTHER): Payer: No Typology Code available for payment source

## 2019-05-19 ENCOUNTER — Other Ambulatory Visit: Payer: Self-pay

## 2019-05-19 ENCOUNTER — Ambulatory Visit (INDEPENDENT_AMBULATORY_CARE_PROVIDER_SITE_OTHER): Payer: No Typology Code available for payment source | Admitting: Orthopaedic Surgery

## 2019-05-19 ENCOUNTER — Encounter: Payer: Self-pay | Admitting: Orthopaedic Surgery

## 2019-05-19 VITALS — Ht 64.0 in | Wt 250.0 lb

## 2019-05-19 DIAGNOSIS — M654 Radial styloid tenosynovitis [de Quervain]: Secondary | ICD-10-CM | POA: Diagnosis not present

## 2019-05-19 DIAGNOSIS — M25561 Pain in right knee: Secondary | ICD-10-CM | POA: Diagnosis not present

## 2019-05-19 DIAGNOSIS — M7061 Trochanteric bursitis, right hip: Secondary | ICD-10-CM | POA: Diagnosis not present

## 2019-05-19 MED ORDER — MELOXICAM 7.5 MG PO TABS: 7.5000 mg | ORAL_TABLET | Freq: Two times a day (BID) | ORAL | 0 refills | Status: DC

## 2019-05-19 MED ORDER — DICLOFENAC SODIUM 1 % TD GEL: 4.0000 g | Freq: Four times a day (QID) | TRANSDERMAL | 1 refills | Status: DC

## 2019-05-19 MED ORDER — MELOXICAM 7.5 MG PO TABS: ORAL_TABLET | ORAL | 1 refills | Status: DC

## 2019-05-19 MED FILL — MELOXICAM 7.5 MG TABLET: 7.5 | 46 days supply | Qty: 60 | Fill #0

## 2019-05-19 MED FILL — DICLOFENAC SODIUM 1 % GEL: 1 | 5 days supply | Qty: 100 | Fill #0

## 2019-05-19 NOTE — Progress Notes (Signed)
Office Visit Note   Patient: Stephanie Long           Date of Birth: 1975/12/07           MRN: [id number] Visit Date: 05/19/2019              Requested by: Pearline Cablesopland, Jessica C, MD [address] [place],  [place] [id number] PCP: Pearline Cablesopland, Jessica C, MD   Assessment & Plan: Visit Diagnoses:  1. Acute pain of right knee   2. Trochanteric bursitis, right hip   3. De Quervain's tenosynovitis, right     Plan: We will have her refuse Voltaren gel on both the first extensor compartment of the right wrist and right knee.  She will wear the wrist splint with a thumb spica for the next 2 weeks and then wean out of it as tolerated.  Discussed with her quad strengthening exercises and knee friendly exercises along with IT band stretching exercises which were shown today.  See her back in 4 weeks time with type of response she had to injections both the hip and the knee.  She is to go on her meloxicam 7.5 mg twice daily for 2 weeks and then go back to once daily thereafter.  No NSAIDs while on the meloxicam.  Follow-Up Instructions: Return in about 4 weeks (around 06/16/2019).   Orders:  Orders Placed This Encounter  Procedures  . XR KNEE 3 VIEW RIGHT   Meds ordered this encounter  Medications  . meloxicam (MOBIC) 7.5 MG tablet    Sig: Take 1 tablet (7.5 mg total) by mouth 2 (two) times a day. Take  2 times daily for 2 weeks then once daily    Dispense:  60 tablet    Refill:  0  . meloxicam (MOBIC) 7.5 MG tablet    Sig: Take 1 tablet by mouth with food daily.    Dispense:  30 tablet    Refill:  1  . diclofenac sodium (VOLTAREN) 1 % GEL    Sig: Apply 4 g topically 4 (four) times daily. Apply to right knee 4 grams 4 times per day and right thumb 2 grams 4 times per day    Dispense:  50 g    Refill:  1      Procedures: No procedures performed   Clinical Data: No additional findings.   Subjective: Chief Complaint  Patient presents with  . Right Knee - Pain  .  Right Hip - Pain  . Right Wrist - Pain    HPI Stephanie Long is a pleasant 43 year old female comes in today for multiple complaints.  She has had right knee pain : For couple months.  She reports no injury.  She did have some discomfort after doing some jumping jacks with her daughter.  Pains about the patella and medial aspect of the right knee.  She not describes any mechanical symptoms.  States that the knee pain is worse with going up and down steps and also if she sits for prolonged period of time.  Denies any edema.  She is tried Tylenol ibuprofen over-the-counter pain patches and meloxicam.  She denies any significant relief with any of these treatments.  She having difficulty sleeping due to her right knee.  She is also having some right lateral hip pain.  Describes it as dull achy pain with lying on her hip.  She has had no injury to the right hip.  No groin pain.  Right hip pain  worse whenever she goes to get up from a seated position.  She is having no numbness tingling down either leg. Right wrist pain which is been ongoing for the past week.  No injury.  Constant pain radial side thumb.  She has difficulty opening cans without pain.  She is right-hand dominant.  Review of Systems  Constitutional: Negative for chills and fever.  Respiratory: Negative for shortness of breath.    See HPI Oterwise negative or non- contributory.   Objective: Vital Signs: Ht 5\' 4"  (1.626 m)   Wt 250 lb (113.4 kg)   BMI 42.91 kg/m   Physical Exam Constitutional:      Appearance: She is not ill-appearing or diaphoretic.  Cardiovascular:     Pulses: Normal pulses.  Pulmonary:     Effort: Pulmonary effort is normal.  Neurological:     Mental Status: She is alert and oriented to person, place, and time.     Ortho Exam Bilateral hips excellent range of motion.  Right hips slight discomfort with external rotation lateral aspect of the hip.  Tenderness over the right trochanteric region.  Bilateral knees  full range of motion.  Passive range of motion reveals crepitus patella region of both knees right greater than left.  She has slight hyperextension of both knees.  No instability valgus varus stressing.  Slight tenderness along the medial joint line of the right knee only.  No effusion abnormal warmth of either knee. Right hand she has full sensation throughout the hand.  Full motor.  No rashes skin lesions ulcerations.  Grind test on the right is negative.  She has tenderness along the first extensor compartment of the right thumb only.  Positive Finkelstein's on the right.  No tenderness of the right elbow. Specialty Comments:  No specialty comments available.  Imaging: Xr Knee 3 View Right  Result Date: 05/19/2019 Right knee AP lateral and sunrise view: Shows lateralization of the right patella taking on the sunrise view with a comparison view of the left knee against the right patella is slightly more lateralized.  No acute fracture.  Mild narrowing medial lateral joint lines.  Knee is well located.    PMFS History: Patient Active Problem List   Diagnosis Date Noted  . Right knee pain 10/09/2017  . Right foot pain 10/09/2017  . Plantar fasciitis of right foot 10/16/2016  . Right shoulder pain 10/16/2016  . Obesity 10/07/2016  . DERMATITIS, OTHER ATOPIC 03/24/2007  . Mild persistent asthma without complication 03/10/2007   Past Medical History:  Diagnosis Date  . Allergy   . Asthma   . GERD (gastroesophageal reflux disease)   . Gestational diabetes   . Gestational hypertension 05/09/2013  . Hx of varicella   . PCOS (polycystic ovarian syndrome) 2012  . Plantar fasciitis   . Symptomatic cholelithiasis     Family History  Problem Relation Age of Onset  . Diabetes Father   . Heart disease Father   . Hyperlipidemia Father   . Heart attack Father   . Depression Mother   . Stroke Maternal Grandmother   . Cancer Neg Hx   . Asthma Neg Hx   . Early death Neg Hx     Past  Surgical History:  Procedure Laterality Date  . CESAREAN SECTION N/A 05/09/2013   Procedure: primary cesarean section with delivery of baby girl at 1800.  Apgars 9/9.;  Surgeon: Lenoard Adenichard J Taavon, MD;  Location: [facility];  Service: Obstetrics;  Laterality: N/A;  . CHOLECYSTECTOMY  N/A 06/08/2018   Procedure: LAPAROSCOPIC CHOLECYSTECTOMY;  Surgeon: Georganna Skeans, MD;  Location: [facility];  Service: General;  Laterality: N/A;  . WISDOM TOOTH EXTRACTION    . WISDOM TOOTH EXTRACTION     Social History   Occupational History  . Not on file  Tobacco Use  . Smoking status: Never Smoker  . Smokeless tobacco: Never Used  Substance and Sexual Activity  . Alcohol use: Yes    Alcohol/week: 2.0 standard drinks    Types: 1 Glasses of wine, 1 Standard drinks or equivalent per week  . Drug use: No  . Sexual activity: Yes    Partners: Male

## 2019-05-25 ENCOUNTER — Other Ambulatory Visit: Payer: Self-pay

## 2019-05-25 ENCOUNTER — Other Ambulatory Visit (INDEPENDENT_AMBULATORY_CARE_PROVIDER_SITE_OTHER): Payer: No Typology Code available for payment source

## 2019-05-25 DIAGNOSIS — Z23 Encounter for immunization: Secondary | ICD-10-CM | POA: Diagnosis not present

## 2019-05-26 LAB — HEPATITIS B SURFACE ANTIBODY, QUANTITATIVE: Hepatitis B-Post: 12 m[IU]/mL (ref >=10)

## 2019-05-27 LAB — QUANTIFERON-TB GOLD PLUS
Mitogen-NIL: >10 IU/mL
NIL: 0.02 IU/mL
QuantiFERON-TB Gold Plus: NEGATIVE
TB1-NIL: 0 IU/mL
TB2-NIL: 0 IU/mL

## 2019-06-16 ENCOUNTER — Ambulatory Visit (INDEPENDENT_AMBULATORY_CARE_PROVIDER_SITE_OTHER): Payer: No Typology Code available for payment source | Admitting: Orthopaedic Surgery

## 2019-06-16 ENCOUNTER — Encounter: Payer: Self-pay | Admitting: Orthopaedic Surgery

## 2019-06-16 DIAGNOSIS — M654 Radial styloid tenosynovitis [de Quervain]: Secondary | ICD-10-CM

## 2019-06-16 DIAGNOSIS — M7061 Trochanteric bursitis, right hip: Secondary | ICD-10-CM | POA: Diagnosis not present

## 2019-06-16 DIAGNOSIS — M25561 Pain in right knee: Secondary | ICD-10-CM

## 2019-06-16 NOTE — Progress Notes (Signed)
HPI: Stephanie Long returns today 4 weeks follow-up right hip injection right knee injection and treatment of right right thumb.  She states overall she is much improved.  Has occasional soreness right hip and right knee.  She has been doing some of the exercises but not as much she should.  In regards to her.  Her de Quervain's right states her pain is 3 out of 10 at worst.  She is using Voltaren gel.  She has questions about how long she should be on NSAIDs actually stopped taking oral NSAIDs and using gel.  Review of systems: Please see HPI otherwise negative  Physical exam right hip good range of motion minimal tenderness over the trochanteric region.  Right knee good range of motion without pain.  Right thumb negative Finkelstein's minimal tenderness over the first extensor compartment.  Good range of motion of the thumb without pain.  Impression: Acute right knee pain and right hip trochanteric bursitis improved Right de Quervain's  Plan: Discussed with hert natural anti-inflammatory such as Temovate, dried cherries and glucosamine/chondroitin.  She will take oral anti-inflammatories as needed.  Continue her home exercise program.  Offered formal therapy versus injection of the right she states at this time is not bothering her that much she can always call the office we can send her to therapy for any of the above conditions.  She will continue her home exercise program.  She will follow-up with [place] as needed.

## 2019-06-21 ENCOUNTER — Encounter: Payer: Self-pay | Admitting: Family Medicine

## 2019-06-22 ENCOUNTER — Other Ambulatory Visit: Payer: Self-pay

## 2019-06-22 NOTE — Progress Notes (Addendum)
[facility] [address] [place], [place] [address] [phone]  Date:  06/23/2019   Name:  Stephanie Long   DOB:  August 03, 1976   MRN:  [id number]  PCP:  Darreld Mclean, MD    Chief Complaint: Anxiety and Blood Work   History of Present Illness:  Stephanie Long is a 43 y.o. 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 They are home-schooling right now due to the pandemic Here today as she needs some titers drawn for school-she also wishes to discuss depression and anxiety Routine labs drawn in July  She is doing some counseling through [employer] employee program- she started doing this just this week.  She hopes it will be helpful.  Her counselor suggested that she consider starting medication, so she made appointment with me She has tried meditation and is changing to day shift finally tomorrow-she hopes this will improve her sleep and her mood overall She feels like she has struggled some with depression since her childhood years.  Gotten worse the last few years- she has some self doubt and anxiety  Every few years she will find herself "up against a group of girls at work who might take advantage of my being over sensitive" She knows that she is a good nurse, but sometimes will doubt herself She has not actually made any care mistakes or had any bad outcomes as far as she knows  She will feel like there is so much on her, she is trying to do everything and gets very overwhelmed She really wants to go back to school, but it is one more thing to get done  She is married but she and her  husband are on very different schedules-they rarely see each other  She may have an occasional anxiety attack  Never treated for anxiety/ depression in the past - she would like to try medication No suicidal ideation  No known history of hypertension, though she was changed from estrogen to nonhormonal contraception due to blood pressure trending up.  She will keep an eye on her blood pressure at work forming  BP Readings from Last 3 Encounters:  06/23/19 (!) 150/90  05/05/19 138/88  12/30/18 134/86     Patient Active Problem List   Diagnosis Date Noted  . Right knee pain 10/09/2017  . Right foot pain 10/09/2017  . Plantar fasciitis of right foot 10/16/2016  . Right shoulder pain 10/16/2016  . Obesity 10/07/2016  . DERMATITIS, OTHER ATOPIC 03/24/2007  . Mild persistent asthma without complication 63/84/5364    Past Medical History:  Diagnosis Date  . Allergy   . Asthma   . GERD (gastroesophageal reflux disease)   . Gestational diabetes   . Gestational hypertension 05/09/2013  . Hx of varicella   . PCOS (polycystic ovarian syndrome) 2012  . Plantar fasciitis   . Symptomatic cholelithiasis     Past Surgical History:  Procedure Laterality Date  . CESAREAN SECTION N/A 05/09/2013   Procedure: primary cesarean section with delivery of baby girl at 9.  Apgars 9/9.;  Surgeon: Lovenia Kim, MD;  Location: [facility];  Service: Obstetrics;  Laterality: N/A;  . CHOLECYSTECTOMY N/A 06/08/2018   Procedure: LAPAROSCOPIC CHOLECYSTECTOMY;  Surgeon: Georganna Skeans, MD;  Location: [facility];  Service: General;  Laterality: N/A;  . WISDOM TOOTH EXTRACTION    . WISDOM TOOTH EXTRACTION      Social History   Tobacco Use  . Smoking status: Never Smoker  . Smokeless tobacco: Never Used  Substance Use Topics  . Alcohol use: Yes    Alcohol/week: 2.0 standard drinks    Types: 1 Glasses of wine, 1 Standard drinks or equivalent per week  . Drug use: No    Family History  Problem  Relation Age of Onset  . Diabetes Father   . Heart disease Father   . Hyperlipidemia Father   . Heart attack Father   . Depression Mother   . Stroke Maternal Grandmother   . Cancer Neg Hx   . Asthma Neg Hx   . Early death Neg Hx     No Known Allergies  Medication list has been reviewed and updated.  Current Outpatient Medications on File Prior to Visit  Medication Sig Dispense Refill  . Acetaminophen (TYLENOL PO) Take 1 tablet by mouth as needed.    . calcium carbonate (TUMS - DOSED IN MG ELEMENTAL CALCIUM) 500 MG chewable tablet Chew 1 tablet by mouth daily as needed for indigestion or heartburn.     . cetirizine (ZYRTEC) 10 MG tablet Take 10 mg by mouth daily as needed.     . diclofenac sodium (VOLTAREN) 1 % GEL Apply 4 g topically 4 (four) times daily. Apply to right knee 4 grams 4 times per day and right thumb 2 grams 4 times per day 50 g 1  . IBUPROFEN PO Take 1 tablet by mouth as needed.    . Melatonin 5 MG TABS Take by mouth.    . meloxicam (MOBIC) 7.5 MG tablet Take 1 tablet by mouth with food daily. 30 tablet 1  . Multiple Vitamin (MULTIVITAMIN) tablet Take 1 tablet by mouth daily.    Marland Kitchen omeprazole (PRILOSEC) 40 MG capsule Take 1 capsule (40 mg total) by mouth daily. 30 capsule 3  . VENTOLIN HFA 108 (90 Base) MCG/ACT inhaler INHALE 2 PUFFS INTO THE LUNGS EVERY 6 HOURS AS NEEDED FOR WHEEZING OR SHORTNESS OF BREATH. 18 g 6   No current facility-administered medications on file prior to visit.     Review of Systems:  As per HPI- otherwise negative. No fever or chills No chest pain or shortness of breath  Physical Examination: Vitals:   06/23/19 1620 06/23/19 1644  BP: (!) 158/98 (!) 150/90  Pulse: 75   Resp: 16   Temp: 97.7 F (36.5 C)   SpO2: 98%    Vitals:   06/23/19 1620  Weight: 250 lb (113.4 kg)  Height: _0  (1.626 m)   Body mass index is 42.91 kg/m. Ideal Body Weight: Weight in (lb) to have BMI = 25: 145.3  GEN: WDWN, NAD, Non-toxic, A & O x 3,  obese, looks well HEENT: Atraumatic, Normocephalic. Neck supple. No masses, No LAD. Ears and Nose: No external deformity. CV: RRR, No M/G/R. No JVD. No thrill. No extra heart sounds. PULM: CTA B, no wheezes, crackles, rhonchi. No retractions. No resp. distress. No accessory muscle use. EXTR: No c/c/e NEURO Normal gait.  PSYCH: Normally interactive. Conversant. Not depressed or anxious appearing.  Calm demeanor.    Assessment and Plan:   ICD-10-CM   1. Anxiety and  depression  F41.9 Melatonin 5 MG TABS   F32.9 FLUoxetine (PROZAC) 20 MG capsule  2. History of exposure to infectious disease  Z20.9 Measles/Mumps/Rubella Immunity    Varicella zoster antibody, IgG  3. Vaginal itching  N89.8 fluconazole (DIFLUCAN) 150 MG tablet  4. Elevated blood pressure reading  R03.0    Here today to discuss anxiety and depression. [place] is struggled with this somewhat over the last several years, is going through a rough patch right now.  She like to try medication, encouraged her to also continue counseling.  She will try to exercise more We will start her on fluoxetine 20 mg, increase to 2 pills afterwards 2 weeks She will keep me updated as to how she is feeling, will let me know right away if any problems with the medication or issues with suicidal thoughts  Rx for Diflucan for vaginal itching but she has noticed for the last week or so No suspicion of STI MMR and varicella antibodies drawn for school  Blood pressure is borderline today, she will monitor at work and let me know how she looks Follow-up: No follow-ups on file.  Meds ordered this encounter  Medications  . FLUoxetine (PROZAC) 20 MG capsule    Sig: Take 1 capsule (20 mg total) by mouth daily. Increase to 40 mg after 2 weeks    Dispense:  60 capsule    Refill:  5  . fluconazole (DIFLUCAN) 150 MG tablet    Sig: Take 1 tablet (150 mg total) by mouth once for 1 dose. Repeat in one week if needed    Dispense:  2 tablet    Refill:  0    Orders Placed This Encounter  Procedures  . Measles/Mumps/Rubella Immunity  . Varicella zoster antibody, IgG    _0 @    Signed Lamar Blinks, MD  Received her labs 8/27-  Message to pt Results for orders placed or performed in visit on 06/23/19  Measles/Mumps/Rubella Immunity  Result Value Ref Range   Rubeola IgG 72.30 AU/mL   Mumps IgG >300.00 AU/mL   Rubella 5.46 index  Varicella zoster antibody, IgG  Result Value Ref Range   Varicella IgG 661.00 index   Patient sent me the following message on 9/14: At the end of my visit, we speculated adding a beta blocker to help with my BP...could we try something low dose? Last week, my BP was 154/94. Today, I've had a migraine that Tylenol hasn't helped, so I've been watching my BP. At 0900: 148/92, 1100: 158/99, and 1300: 158/100, and so I know my MAP has been pretty high. Any suggestions?   -We will send her message and inquire about her pulse

## 2019-06-23 ENCOUNTER — Ambulatory Visit (INDEPENDENT_AMBULATORY_CARE_PROVIDER_SITE_OTHER): Payer: No Typology Code available for payment source | Admitting: Family Medicine

## 2019-06-23 ENCOUNTER — Encounter: Payer: Self-pay | Admitting: Family Medicine

## 2019-06-23 VITALS — BP 150/90 | HR 75 | Temp 97.7°F | Resp 16 | Ht 64.0 in | Wt 250.0 lb

## 2019-06-23 DIAGNOSIS — N898 Other specified noninflammatory disorders of vagina: Secondary | ICD-10-CM

## 2019-06-23 DIAGNOSIS — R030 Elevated blood-pressure reading, without diagnosis of hypertension: Secondary | ICD-10-CM

## 2019-06-23 DIAGNOSIS — F329 Major depressive disorder, single episode, unspecified: Secondary | ICD-10-CM

## 2019-06-23 DIAGNOSIS — Z209 Contact with and (suspected) exposure to unspecified communicable disease: Secondary | ICD-10-CM | POA: Diagnosis not present

## 2019-06-23 DIAGNOSIS — F419 Anxiety disorder, unspecified: Secondary | ICD-10-CM | POA: Diagnosis not present

## 2019-06-23 MED ORDER — FLUCONAZOLE 150 MG PO TABS: 150.0000 mg | ORAL_TABLET | Freq: Once | ORAL | 0 refills | Status: AC

## 2019-06-23 MED ORDER — FLUOXETINE HCL 20 MG PO CAPS: 20.0000 mg | ORAL_CAPSULE | Freq: Every day | ORAL | 5 refills | Status: DC

## 2019-06-23 MED FILL — FLUoxetine HCL 20 MG CAPS: 20 | 33 days supply | Qty: 60 | Fill #0

## 2019-06-23 MED FILL — FLUCONAZOLE 150 MG TABS: 150 | 7 days supply | Qty: 2 | Fill #0

## 2019-06-23 NOTE — Patient Instructions (Addendum)
Good to see you today- take care and best of luck with school- and home school!    Diflucan for likely yeast vaginitis take one and repeat in a week if needed prozac - fluoxetine- 20 mg once a day Increase to 2 pills after 2 weeks Can take whatever time of day is easiest for you  Please let me know how you are feeling in 4 weeks- sooner if not doing ok or if worse   Your BP is a bit high today- please monitor at home and let me know if continuing to be elevated- in that case we can add a medication for BP

## 2019-06-24 ENCOUNTER — Encounter: Payer: Self-pay | Admitting: Family Medicine

## 2019-06-24 LAB — MEASLES/MUMPS/RUBELLA IMMUNITY
Mumps IgG: >300 AU/mL
Rubella: 5.46 index
Rubeola IgG: 72.3 AU/mL

## 2019-06-24 LAB — VARICELLA ZOSTER ANTIBODY, IGG: Varicella IgG: 661 index

## 2019-07-12 ENCOUNTER — Encounter: Payer: Self-pay | Admitting: Family Medicine

## 2019-07-12 DIAGNOSIS — R030 Elevated blood-pressure reading, without diagnosis of hypertension: Secondary | ICD-10-CM

## 2019-07-13 MED ORDER — HYDROCHLOROTHIAZIDE 12.5 MG PO CAPS: 12.5000 mg | ORAL_CAPSULE | Freq: Every day | ORAL | 6 refills | Status: DC

## 2019-07-14 MED FILL — OMEPRAZOLE 40 MG CPDR: 40 | 30 days supply | Qty: 30 | Fill #2

## 2019-07-14 MED FILL — HYDROCHLOROTHIAZIDE 12.5 MG: 12.5 | 30 days supply | Qty: 30 | Fill #0

## 2019-08-03 IMAGING — US US ABDOMEN COMPLETE
1 series · 14 of 25 positions shown · non-contrast
Comparison: None.

CLINICAL DATA: Right upper quadrant pain and diarrhea

EXAM:
ABDOMEN ULTRASOUND COMPLETE

[Series 1: us abdomen complete · 0.22mm/px · 14 of 70 slices shown]
[im 1/70]
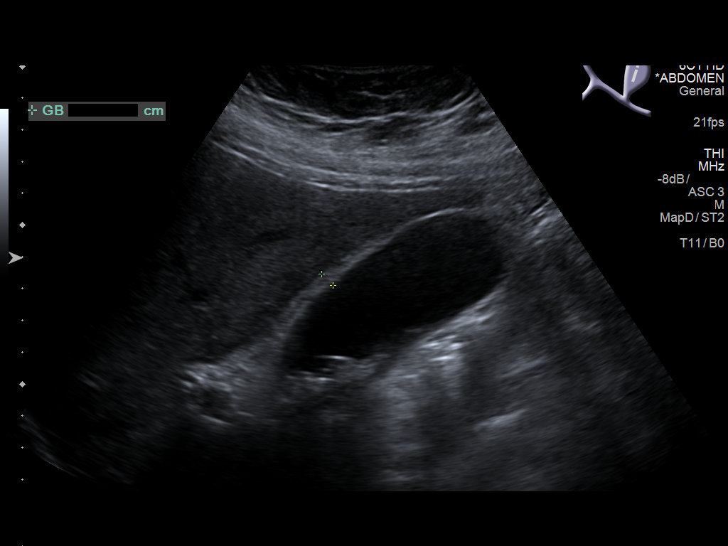
[im 6/70]
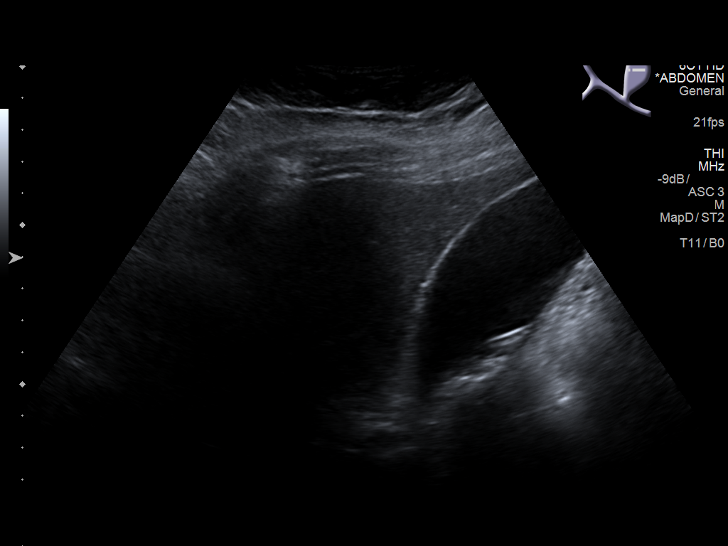
[im 12/70]
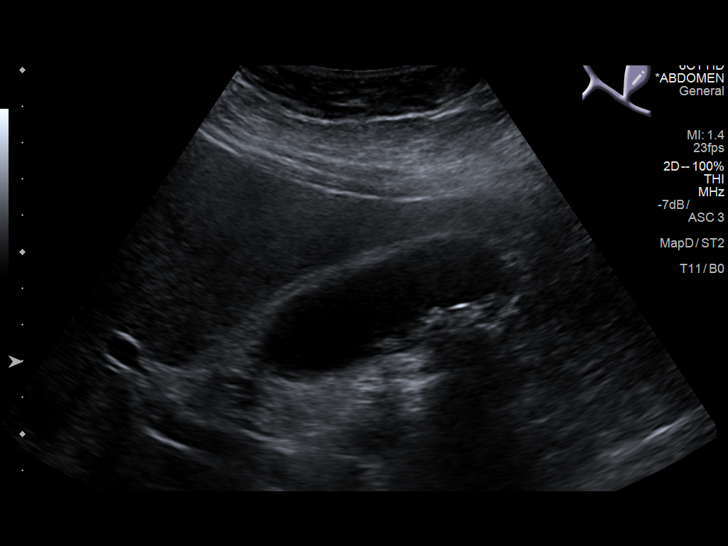
[im 18/70]
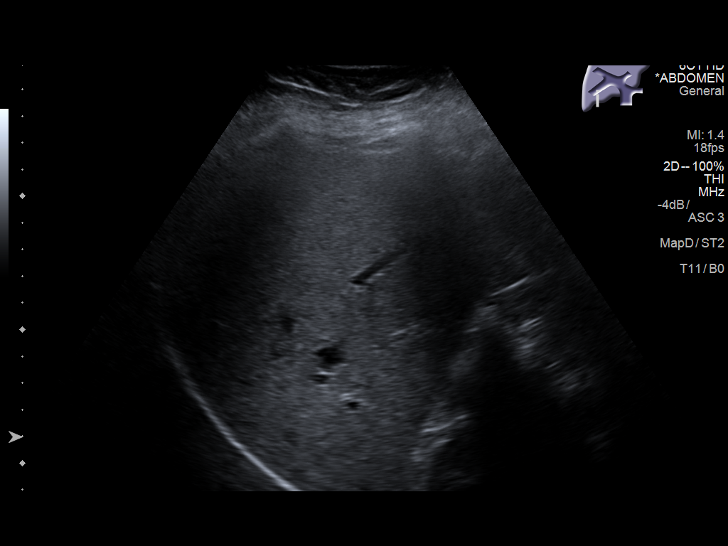
[im 24/70]
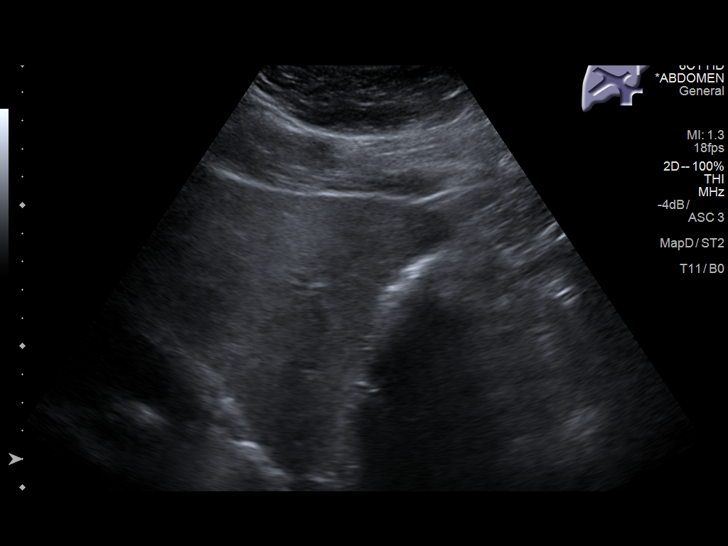
[im 26/70]
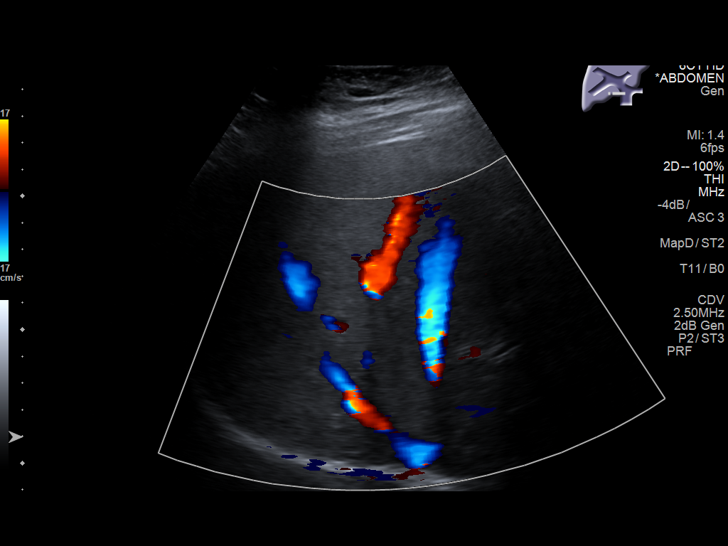
[im 32/70]
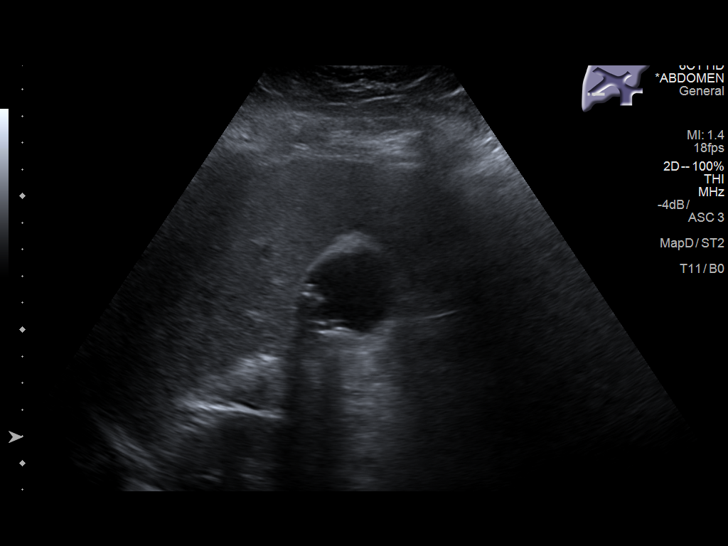
[im 38/70]
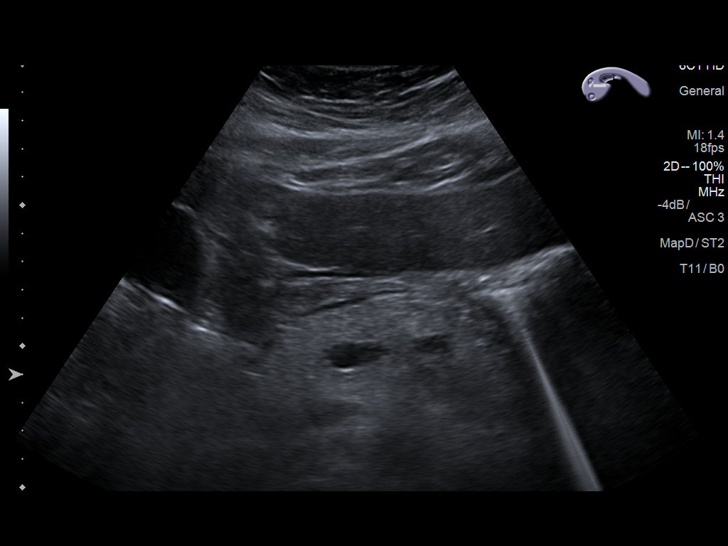
[im 44/70]
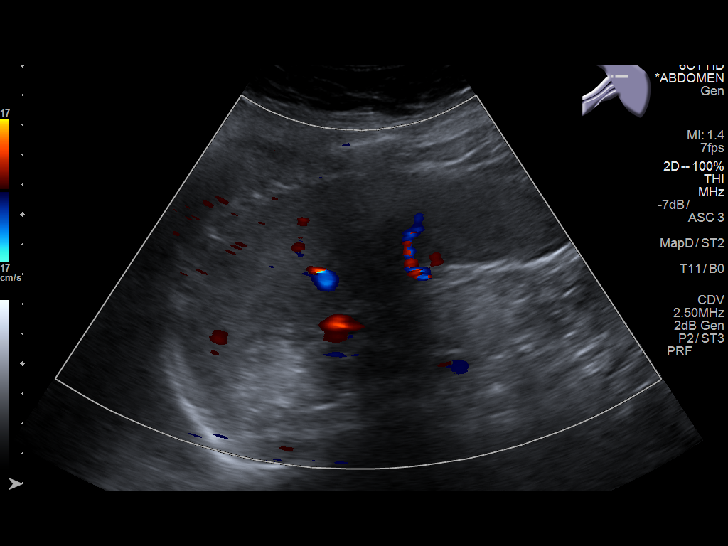
[im 47/70]
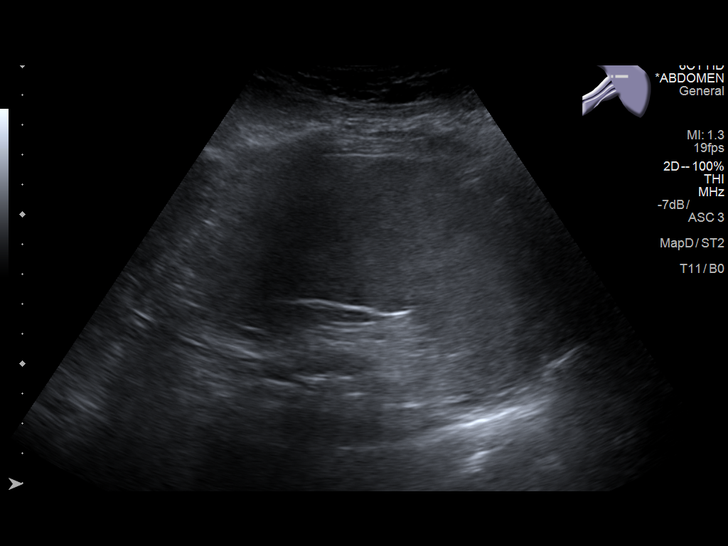
[im 52/70]
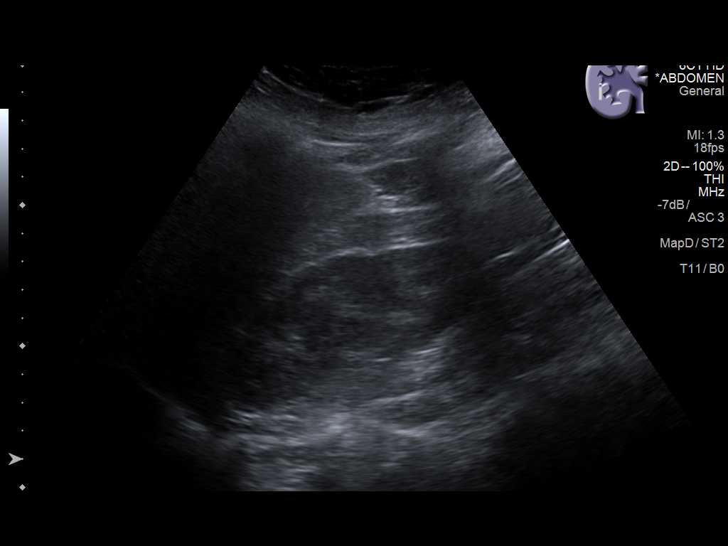
[im 58/70]
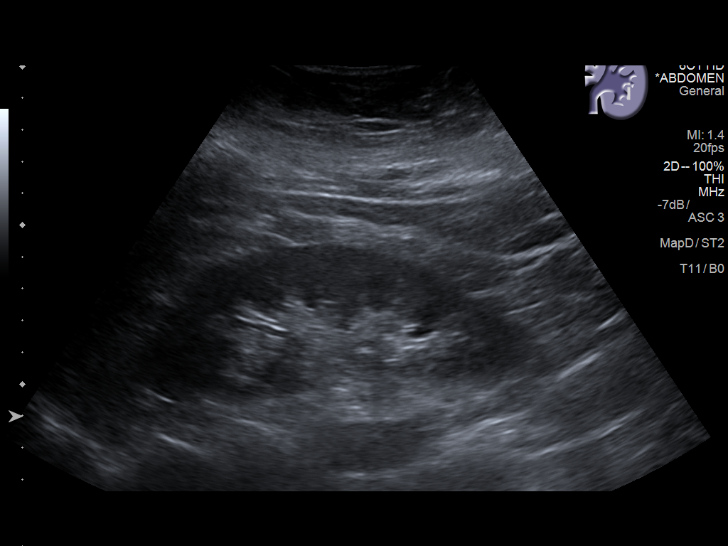
[im 64/70]
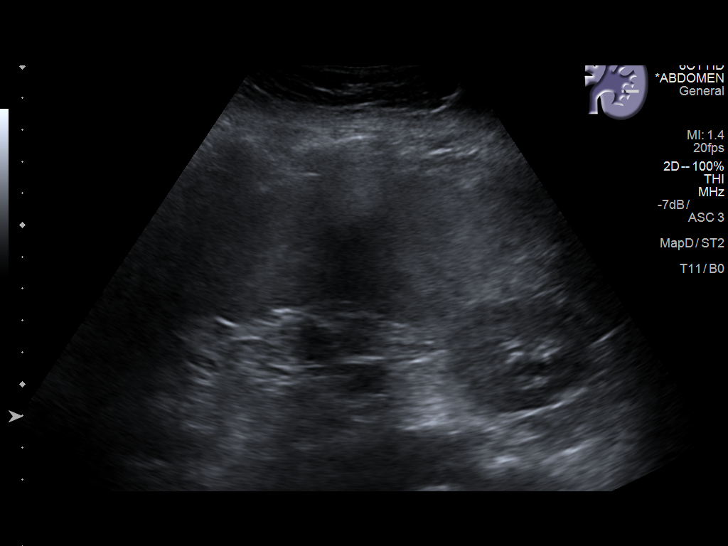
[im 70/70]
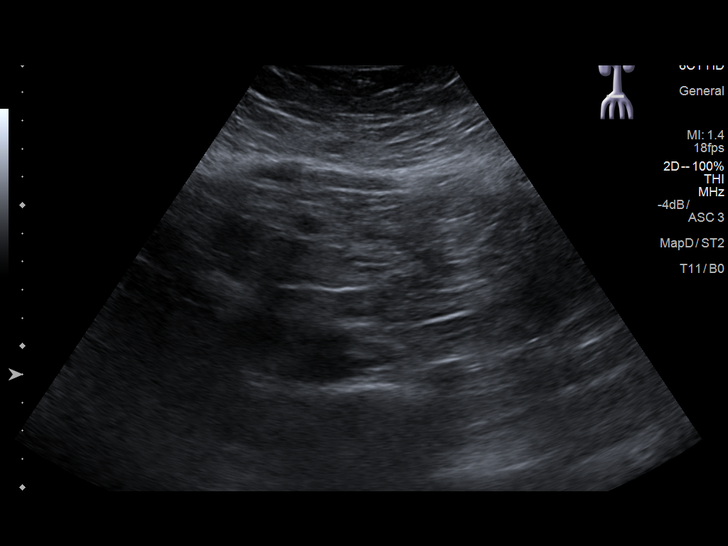

[14 of 25 positions shown; findings below may reference images not displayed]

FINDINGS: Gallbladder: Sludge and small stones. Trace pericholecystic fluid.
Negative sonographic Murphy sign reported by the sonographer. No
wall thickening.

Common bile duct: Diameter: 3 mm

Liver: No focal lesion identified. Within normal limits in
parenchymal echogenicity. Portal vein is patent on color Doppler
imaging with normal direction of blood flow towards the liver.

IVC: No abnormality visualized.

Pancreas: Visualized portion unremarkable.

Spleen: Size and appearance within normal limits.

Right Kidney: Length: 11.5 cm. Echogenicity within normal limits. No
mass or hydronephrosis visualized.

Left Kidney: Length: 12.4 cm. Echogenicity within normal limits. No
mass or hydronephrosis visualized.

Abdominal aorta: No aneurysm visualized.

Other findings: None.
IMPRESSION: Cholelithiasis and trace pericholecystic fluid without other
evidence of acute cholecystitis.

## 2019-08-10 MED FILL — HYDROCHLOROTHIAZIDE 12.5 MG: 12.5 | 30 days supply | Qty: 30 | Fill #1

## 2019-09-14 MED FILL — HYDROCHLOROTHIAZIDE 12.5 MG: 12.5 | 30 days supply | Qty: 30 | Fill #2

## 2019-10-14 MED FILL — OMEPRAZOLE 40 MG CPDR: 40 | 30 days supply | Qty: 30 | Fill #3

## 2019-10-14 MED FILL — HYDROCHLOROTHIAZIDE 12.5 MG: 12.5 | 30 days supply | Qty: 30 | Fill #3

## 2019-11-16 ENCOUNTER — Other Ambulatory Visit: Payer: Self-pay | Admitting: Family Medicine

## 2019-11-16 DIAGNOSIS — K219 Gastro-esophageal reflux disease without esophagitis: Secondary | ICD-10-CM

## 2019-11-16 MED FILL — HYDROCHLOROTHIAZIDE 12.5 MG: 12.5 | 30 days supply | Qty: 30 | Fill #4

## 2019-11-17 MED FILL — OMEPRAZOLE 40 MG CPDR: 40 | 30 days supply | Qty: 30 | Fill #0

## 2019-12-29 ENCOUNTER — Ambulatory Visit: Payer: No Typology Code available for payment source | Attending: Internal Medicine

## 2020-01-14 MED FILL — HYDROCHLOROTHIAZIDE 12.5 MG: 12.5 | 30 days supply | Qty: 30 | Fill #5

## 2020-02-10 MED FILL — HYDROCHLOROTHIAZIDE 12.5 MG: 12.5 | 30 days supply | Qty: 30 | Fill #6

## 2020-02-17 ENCOUNTER — Telehealth (INDEPENDENT_AMBULATORY_CARE_PROVIDER_SITE_OTHER): Payer: No Typology Code available for payment source | Admitting: Family Medicine

## 2020-02-17 DIAGNOSIS — J029 Acute pharyngitis, unspecified: Secondary | ICD-10-CM | POA: Diagnosis not present

## 2020-02-17 DIAGNOSIS — Z20822 Contact with and (suspected) exposure to covid-19: Secondary | ICD-10-CM | POA: Diagnosis not present

## 2020-02-17 MED ORDER — PENICILLIN V POTASSIUM 500 MG PO TABS: 500.0000 mg | ORAL_TABLET | Freq: Two times a day (BID) | ORAL | 0 refills | Status: DC

## 2020-02-17 MED FILL — PENICILLIN VK 500 MG TABLET: 500 | 10 days supply | Qty: 20 | Fill #0

## 2020-02-17 NOTE — Progress Notes (Signed)
[facility] [address] [place], [place] [address] [phone]  Date:  02/17/2020   Name:  Stephanie Long   DOB:  1975-12-23   MRN:  [id number]  PCP:  Pearline Cables, MD    Chief Complaint: No chief complaint on file.   History of Present Illness:  Stephanie Long is a 44 y.o. very pleasant female patient who presents with the following:  Patient with history of obesity and asthma, otherwise generally in good health Last seen by myself in August 2020 Virtual visit today for concern of pharyngitis Patient location is home, provider location is office. Patient identity confirmed with two factors, she gives consent for virtual visit today. The patient and myself are present on the call today  recently she has noted sx of possible allergies-stuffy, congested, sneezing However as of yesterday she noted a ST, and her throat looks red and inflamed in the mirror The pain may radiate into her ears No fevers Yesterday she noted some body aches No cough No vomiting diarrhea The left tonsil is more tender Tender nodes in her neck No sick contacts at home  She tends to get strep throat   She had her covid vaccine already  No chance of current pregnancy  Patient Active Problem List   Diagnosis Date Noted  . Right knee pain 10/09/2017  . Right foot pain 10/09/2017  . Plantar fasciitis of right foot 10/16/2016  . Right shoulder pain 10/16/2016  . Obesity 10/07/2016  . DERMATITIS, OTHER ATOPIC 03/24/2007  . Mild persistent asthma without complication 03/10/2007    Past Medical History:  Diagnosis Date  . Allergy   . Asthma   . GERD (gastroesophageal reflux disease)   . Gestational diabetes   . Gestational hypertension 05/09/2013  . Hx of varicella   . PCOS (polycystic ovarian syndrome) 2012  . Plantar fasciitis   . Symptomatic cholelithiasis     Past Surgical History:  Procedure Laterality  Date  . CESAREAN SECTION N/A 05/09/2013   Procedure: primary cesarean section with delivery of baby girl at 1800.  Apgars 9/9.;  Surgeon: Lenoard Aden, MD;  Location: [facility];  Service: Obstetrics;  Laterality: N/A;  . CHOLECYSTECTOMY N/A 06/08/2018   Procedure: LAPAROSCOPIC CHOLECYSTECTOMY;  Surgeon: Violeta Gelinas, MD;  Location: [facility];  Service: General;  Laterality: N/A;  . WISDOM TOOTH EXTRACTION    . WISDOM TOOTH EXTRACTION      Social History   Tobacco Use  . Smoking status: Never Smoker  . Smokeless tobacco: Never Used  Substance Use Topics  . Alcohol use: Yes    Alcohol/week: 2.0 standard drinks    Types: 1 Glasses of wine, 1 Standard drinks or equivalent per week  . Drug use: No    Family History  Problem Relation Age of Onset  . Diabetes Father   . Heart disease Father   . Hyperlipidemia Father   . Heart attack Father   . Depression Mother   . Stroke Maternal Grandmother   . Cancer Neg Hx   . Asthma Neg Hx   . Early death Neg Hx     No Known Allergies  Medication list has been reviewed and updated.  Current Outpatient Medications on File Prior to Visit  Medication Sig Dispense Refill  . Acetaminophen (TYLENOL PO) Take 1 tablet by mouth as needed.    . calcium carbonate (TUMS - DOSED IN MG ELEMENTAL CALCIUM) 500  MG chewable tablet Chew 1 tablet by mouth daily as needed for indigestion or heartburn.     . cetirizine (ZYRTEC) 10 MG tablet Take 10 mg by mouth daily as needed.     . diclofenac sodium (VOLTAREN) 1 % GEL Apply 4 g topically 4 (four) times daily. Apply to right knee 4 grams 4 times per day and right thumb 2 grams 4 times per day 50 g 1  . FLUoxetine (PROZAC) 20 MG capsule Take 1 capsule (20 mg total) by mouth daily. Increase to 40 mg after 2 weeks 60 capsule 5  . hydrochlorothiazide (MICROZIDE) 12.5 MG capsule Take 1 capsule (12.5 mg total) by mouth daily. 30 capsule 6  . IBUPROFEN PO Take 1 tablet by mouth as needed.    . Melatonin 5 MG TABS Take  by mouth.    . meloxicam (MOBIC) 7.5 MG tablet Take 1 tablet by mouth with food daily. 30 tablet 1  . Multiple Vitamin (MULTIVITAMIN) tablet Take 1 tablet by mouth daily.    Marland Kitchen omeprazole (PRILOSEC) 40 MG capsule TAKE 1 CAPSULE BY MOUTH ONCE A DAY 30 capsule 3  . VENTOLIN HFA 108 (90 Base) MCG/ACT inhaler INHALE 2 PUFFS INTO THE LUNGS EVERY 6 HOURS AS NEEDED FOR WHEEZING OR SHORTNESS OF BREATH. 18 g 6   No current facility-administered medications on file prior to visit.    Review of Systems:  As per HPI- otherwise negative.   Physical Examination: There were no vitals filed for this visit. There were no vitals filed for this visit. There is no height or weight on file to calculate BMI. Ideal Body Weight:    Patient is order video.  She looks well, her normal self No distress, wheezing, tachypnea is noted She showed me her throat over video.  She does have some inflammation and mild enlargement of the left tonsil, no exudate is seen.  No suspicion of tonsillar abscess from video exam Assessment and Plan: Pharyngitis, unspecified etiology - Plan: penicillin v potassium (VEETID) 500 MG tablet  Virtual visit today for pharyngitis.  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  She is scheduled for tomorrow.  We will start her on penicillin in the meantime for presumed possible strep throat.  She will take twice a day for 10 days She is asked to let me know if not feeling better within the next 2 days, sooner if worse  Signed Lamar Blinks, MD

## 2020-02-29 MED FILL — ALBUTEROL SULFATE HFA 108 (: 108 (90 BAS | 25 days supply | Qty: 18 | Fill #1

## 2020-03-13 ENCOUNTER — Encounter: Payer: Self-pay | Admitting: Family Medicine

## 2020-03-14 ENCOUNTER — Telehealth: Payer: Self-pay | Admitting: Family Medicine

## 2020-03-14 NOTE — Telephone Encounter (Signed)
Recv'd records from [facility] forwarded 1 page to Dr. Shanda Bumps Copland 5/18/21fbg

## 2020-03-15 ENCOUNTER — Other Ambulatory Visit: Payer: Self-pay | Admitting: Family Medicine

## 2020-03-15 DIAGNOSIS — R030 Elevated blood-pressure reading, without diagnosis of hypertension: Secondary | ICD-10-CM

## 2020-03-15 MED FILL — HYDROCHLOROTHIAZIDE 12.5 MG: 12.5 | 30 days supply | Qty: 30 | Fill #0

## 2020-04-25 ENCOUNTER — Other Ambulatory Visit: Payer: Self-pay

## 2020-04-25 ENCOUNTER — Ambulatory Visit (INDEPENDENT_AMBULATORY_CARE_PROVIDER_SITE_OTHER): Payer: No Typology Code available for payment source

## 2020-04-25 ENCOUNTER — Encounter: Payer: Self-pay | Admitting: Orthopaedic Surgery

## 2020-04-25 ENCOUNTER — Ambulatory Visit: Payer: No Typology Code available for payment source | Admitting: Family Medicine

## 2020-04-25 DIAGNOSIS — M25561 Pain in right knee: Secondary | ICD-10-CM | POA: Diagnosis not present

## 2020-04-25 DIAGNOSIS — G8929 Other chronic pain: Secondary | ICD-10-CM

## 2020-04-25 NOTE — Progress Notes (Signed)
Office Visit Note   Patient: Stephanie Long           Date of Birth: June 20, 1976           MRN: [id number] Visit Date: 04/25/2020 Requested by: Pearline Cables, MD [address] [place],  [place] [address] PCP: Pearline Cables, MD  Subjective: Chief Complaint  Patient presents with  . Right Knee - Pain    HPI: She is here with right knee pain.  Longstanding problems with her knee.  Last year she came in and received cortisone injection in the knee and her right hip.  She had improvement in her symptoms but was not pain-free.  She has been trying to lose weight and do a modified [employer] exercise regimen.  Lately in the past month or 2, her knee has flared up.  She has pain mostly on the anterior and medial aspect.  No locking or catching symptoms.  She is using turmeric and occasionally ibuprofen.  She tried taping her knee as well.  Since she called to make the appointment her pain has improved.               ROS:   All other systems were reviewed and are negative.  Objective: Vital Signs: There were no vitals taken for this visit.  Physical Exam:  General:  Alert and oriented, in no acute distress. Pulm:  Breathing unlabored. Psy:  Normal mood, congruent affect.  Right knee: She has trace patellofemoral crepitus, 1+ effusion.  No pain with patella compression or apprehension.  Ligaments feel stable.  She is tender on the medial joint line but has no palpable click on McMurray's.   Imaging: XR Knee 1-2 Views Right  Result Date: 04/25/2020 X-rays right knee show no significant change from 2020.  She has early periarticular spurring on the medial and lateral compartments as well as the patellofemoral joint.  Overall joint spaces well-preserved, no sign of loose body or stress fracture.   Assessment & Plan: 1.  Right knee DJD -She will try adding glucosamine to her regimen.  Continue with strengthening, avoiding loading weight on her knee with it bent  beyond about 75 degrees. -Future considerations could include gel injections.     Procedures: No procedures performed  No notes on file     PMFS History: Patient Active Problem List   Diagnosis Date Noted  . Right knee pain 10/09/2017  . Right foot pain 10/09/2017  . Plantar fasciitis of right foot 10/16/2016  . Right shoulder pain 10/16/2016  . Obesity 10/07/2016  . DERMATITIS, OTHER ATOPIC 03/24/2007  . Mild persistent asthma without complication 03/10/2007   Past Medical History:  Diagnosis Date  . Allergy   . Asthma   . GERD (gastroesophageal reflux disease)   . Gestational diabetes   . Gestational hypertension 05/09/2013  . Hx of varicella   . PCOS (polycystic ovarian syndrome) 2012  . Plantar fasciitis   . Symptomatic cholelithiasis     Family History  Problem Relation Age of Onset  . Diabetes Father   . Heart disease Father   . Hyperlipidemia Father   . Heart attack Father   . Depression Mother   . Stroke Maternal Grandmother   . Cancer Neg Hx   . Asthma Neg Hx   . Early death Neg Hx     Past Surgical History:  Procedure Laterality Date  . CESAREAN SECTION N/A 05/09/2013   Procedure: primary cesarean section with  delivery of baby girl at 1800.  Apgars 9/9.;  Surgeon: Lenoard Aden, MD;  Location: [facility];  Service: Obstetrics;  Laterality: N/A;  . CHOLECYSTECTOMY N/A 06/08/2018   Procedure: LAPAROSCOPIC CHOLECYSTECTOMY;  Surgeon: Violeta Gelinas, MD;  Location: [facility] OR;  Service: General;  Laterality: N/A;  . WISDOM TOOTH EXTRACTION    . WISDOM TOOTH EXTRACTION     Social History   Occupational History  . Not on file  Tobacco Use  . Smoking status: Never Smoker  . Smokeless tobacco: Never Used  Vaping Use  . Vaping Use: Never used  Substance and Sexual Activity  . Alcohol use: Yes    Alcohol/week: 2.0 standard drinks    Types: 1 Glasses of wine, 1 Standard drinks or equivalent per week  . Drug use: No  . Sexual activity: Yes    Partners: Male

## 2020-04-26 MED FILL — HYDROCHLOROTHIAZIDE 12.5 MG: 12.5 | 90 days supply | Qty: 90 | Fill #1

## 2020-05-19 ENCOUNTER — Encounter: Payer: Self-pay | Admitting: Family Medicine

## 2020-05-29 ENCOUNTER — Other Ambulatory Visit: Payer: Self-pay

## 2020-05-29 ENCOUNTER — Ambulatory Visit (HOSPITAL_BASED_OUTPATIENT_CLINIC_OR_DEPARTMENT_OTHER)
Admission: RE | Admit: 2020-05-29 | Discharge: 2020-05-29 | Disposition: A | Discharge status: Home / Self Care | Payer: No Typology Code available for payment source | Source: Ambulatory Visit | Attending: Family Medicine | Admitting: Family Medicine

## 2020-05-29 ENCOUNTER — Telehealth: Payer: Self-pay | Admitting: Family Medicine

## 2020-05-29 DIAGNOSIS — R0781 Pleurodynia: Secondary | ICD-10-CM

## 2020-05-29 NOTE — Telephone Encounter (Signed)
New message:   Pt is calling back to check the status of her x-ray being ordered. Please advise.

## 2020-05-29 NOTE — Telephone Encounter (Signed)
New message:   Pt is calling and states her chiropractor thinks she may have a cracked rib and they have advised her to get an x-ray. Please advise.

## 2020-05-29 NOTE — Telephone Encounter (Signed)
Please advise on xray-if order is placed I will let patient know.

## 2020-05-30 NOTE — Telephone Encounter (Signed)
Received her x-ray- no fracture. Message to pt

## 2020-06-13 NOTE — Progress Notes (Addendum)
[facility] [address] [place], [place] [address] [phone]  Date:  06/14/2020   Name:  Stephanie Long   DOB:  18-Oct-1976   MRN:  [id number]  PCP:  Pearline Cables, MD    Chief Complaint: rash on both hands (clearing up now pt. states) and pt. concerns w/ pos. ongoing rash-like symptoms any where on   History of Present Illness:  Stephanie Long is a 44 y.o. very pleasant female patient who presents with the following:  Patient here today with concern of rash History of asthma, plantar fasciitis Last seen by myself in April for pharyngitis  She contacted me in July with concern of a rash, we have had her use Benadryl-she has continued to have episodes of rash which prompted her visit today She is getting dyshidrotic type eczema on her palms-hand eczema is not unusual for her She has had a couple of other episodes of rash more widespread She had a fever with one episode of rash Benadryl made it go away She did not get any photos Right now she is fine except for baseline rash on her hands.  She is wahing her hands/ using alcohol based sanitizer many times a day right now in her work   Pap- per [employer] up-to-date Covid vaccine complete  No new pets, no new foods or other exposures known  Most recent labs about one year ago  Patient Active Problem List   Diagnosis Date Noted  . Right knee pain 10/09/2017  . Right foot pain 10/09/2017  . Plantar fasciitis of right foot 10/16/2016  . Right shoulder pain 10/16/2016  . Obesity 10/07/2016  . DERMATITIS, OTHER ATOPIC 03/24/2007  . Mild persistent asthma without complication 03/10/2007    Past Medical History:  Diagnosis Date  . Allergy   . Asthma   . GERD (gastroesophageal reflux disease)   . Gestational diabetes   . Gestational hypertension 05/09/2013  . Hx of varicella   . PCOS (polycystic ovarian syndrome) 2012  . Plantar  fasciitis   . Symptomatic cholelithiasis     Past Surgical History:  Procedure Laterality Date  . CESAREAN SECTION N/A 05/09/2013   Procedure: primary cesarean section with delivery of baby girl at 1800.  Apgars 9/9.;  Surgeon: Lenoard Aden, MD;  Location: [facility];  Service: Obstetrics;  Laterality: N/A;  . CHOLECYSTECTOMY N/A 06/08/2018   Procedure: LAPAROSCOPIC CHOLECYSTECTOMY;  Surgeon: Violeta Gelinas, MD;  Location: [facility] OR;  Service: General;  Laterality: N/A;  . WISDOM TOOTH EXTRACTION    . WISDOM TOOTH EXTRACTION      Social History   Tobacco Use  . Smoking status: Never Smoker  . Smokeless tobacco: Never Used  Vaping Use  . Vaping Use: Never used  Substance Use Topics  . Alcohol use: Yes    Alcohol/week: 2.0 standard drinks    Types: 1 Glasses of wine, 1 Standard drinks or equivalent per week  . Drug use: No    Family History  Problem Relation Age of Onset  . Diabetes Father   . Heart disease Father   . Hyperlipidemia Father   . Heart attack Father   . Depression Mother   . Stroke Maternal Grandmother   . Cancer Neg Hx   . Asthma Neg Hx   . Early death Neg Hx     No Known Allergies  Medication list has been reviewed and updated.  Current  Outpatient Medications on File Prior to Visit  Medication Sig Dispense Refill  . Acetaminophen (TYLENOL PO) Take 1 tablet by mouth as needed.    . calcium carbonate (TUMS - DOSED IN MG ELEMENTAL CALCIUM) 500 MG chewable tablet Chew 1 tablet by mouth daily as needed for indigestion or heartburn.     . cetirizine (ZYRTEC) 10 MG tablet Take 10 mg by mouth daily as needed.     . diclofenac sodium (VOLTAREN) 1 % GEL Apply 4 g topically 4 (four) times daily. Apply to right knee 4 grams 4 times per day and right thumb 2 grams 4 times per day 50 g 1  . hydrochlorothiazide (MICROZIDE) 12.5 MG capsule TAKE 1 CAPSULE BY MOUTH ONCE A DAY 30 capsule 5  . IBUPROFEN PO Take 1 tablet by mouth as needed.    . Melatonin 5 MG TABS Take by  mouth.    . Multiple Vitamin (MULTIVITAMIN) tablet Take 1 tablet by mouth daily.    Marland Kitchen omeprazole (PRILOSEC) 40 MG capsule TAKE 1 CAPSULE BY MOUTH ONCE A DAY 30 capsule 3  . penicillin v potassium (VEETID) 500 MG tablet Take 1 tablet (500 mg total) by mouth in the morning and at bedtime. 20 tablet 0  . VENTOLIN HFA 108 (90 Base) MCG/ACT inhaler INHALE 2 PUFFS INTO THE LUNGS EVERY 6 HOURS AS NEEDED FOR WHEEZING OR SHORTNESS OF BREATH. 18 g 6   No current facility-administered medications on file prior to visit.    Review of Systems:  As per HPI- otherwise negative.   Physical Examination: Vitals:   06/14/20 1127  BP: 111/72  Pulse: 67  Resp: 13  Temp: 98 F (36.7 C)  SpO2: 97%   Vitals:   06/14/20 1127  Weight: 237 lb 12.8 oz (107.9 kg)  Height: 5\' 4"  (1.626 m)   Body mass index is 40.82 kg/m. Ideal Body Weight: Weight in (lb) to have BMI = 25: 145.3  GEN: no acute distress.  Obese, looks well  HEENT: Atraumatic, Normocephalic.   No oropharyngeal lesions Ears and Nose: No external deformity. CV: RRR, No M/G/R. No JVD. No thrill. No extra heart sounds. PULM: CTA B, no wheezes, crackles, rhonchi. No retractions. No resp. distress. No accessory muscle use. EXTR: No c/c/e PSYCH: Normally interactive. Conversant.  No rash present at this time except for mild eczema of hands - c/w dyshidrotic eczema on right palm   Assessment and Plan: Screening for diabetes mellitus - Plan: Comprehensive metabolic panel, Hemoglobin A1c  Screening for hyperlipidemia - Plan: Lipid panel  Screening for deficiency anemia - Plan: CBC  Screening for thyroid disorder - Plan: TSH  Encounter for hepatitis C screening test for low risk patient - Plan: Hepatitis C antibody  Dyshidrotic eczema - Plan: triamcinolone ointment (KENALOG) 0.5 %  Rash - Plan: Sedimentation rate, C-reactive protein, ANA  Routine labs pending as above Will have her try triamcinolone for dyshidrotic sx on hands- do not  put on other skin discussed allergist- would like to get lab results first  She will let me know if this continues to happen- asked her to get photos of rash if it comes back  This visit occurred during the SARS-CoV-2 public health emergency.  Safety protocols were in place, including screening questions prior to the visit, additional usage of staff PPE, and extensive cleaning of exam room while observing appropriate contact time as indicated for disinfecting solutions.    Signed , MD  Received her labs as below, message to  patient  Blood counts are normal Metabolic profile normal A1c normal Cholesterol overall okay, HDL is a bit low Thyroid normal Sed rate and CRP normal, no suggestion of an autoimmune disorder.  ANA is still pending  Please let me know if you would like a referral to an allergist, or if you prefer to see if this happens again.  Keep me posted  Results for orders placed or performed in visit on 06/14/20  CBC  Result Value Ref Range   WBC 10.3 4.0 - 10.5 K/uL   RBC 4.50 3.87 - 5.11 Mil/uL   Platelets 226.0 150 - 400 K/uL   Hemoglobin 13.2 12.0 - 15.0 g/dL   HCT 79.0 36 - 46 %   MCV 87.3 78.0 - 100.0 fl   MCHC 33.6 30.0 - 36.0 g/dL   RDW 24.0 97.3 - 53.2 %  Comprehensive metabolic panel  Result Value Ref Range   Sodium 138 135 - 145 mEq/L   Potassium 3.8 3.5 - 5.1 mEq/L   Chloride 104 96 - 112 mEq/L   CO2 26 19 - 32 mEq/L   Glucose, Bld 92 70 - 99 mg/dL   BUN 21 6 - 23 mg/dL   Creatinine, Ser 9.92 0.40 - 1.20 mg/dL   Total Bilirubin 0.5 0.2 - 1.2 mg/dL   Alkaline Phosphatase 85 39 - 117 U/L   AST 15 0 - 37 U/L   ALT 11 0 - 35 U/L   Total Protein 6.6 6.0 - 8.3 g/dL   Albumin 3.9 3.5 - 5.2 g/dL   GFR 42.68 >34.19 mL/min   Calcium 8.9 8.4 - 10.5 mg/dL  Hemoglobin Q2I  Result Value Ref Range   Hgb A1c MFr Bld 5.4 4.6 - 6.5 %  Lipid panel  Result Value Ref Range   Cholesterol 123 0 - 200 mg/dL   Triglycerides 29.7 0 - 149 mg/dL   HDL  98.92 (L) >11.94 mg/dL   VLDL 17.4 0.0 - 08.1 mg/dL   LDL Cholesterol 65 0 - 99 mg/dL   Total CHOL/HDL Ratio 3    NonHDL 85.17   TSH  Result Value Ref Range   TSH 1.84 0.35 - 4.50 uIU/mL  Sedimentation rate  Result Value Ref Range   Sed Rate 9 0 - 20 mm/hr  C-reactive protein  Result Value Ref Range   CRP 1.2 0.5 - 20.0 mg/dL  ANA  Result Value Ref Range   Anti Nuclear Antibody (ANA) NEGATIVE NEGATIVE  Hepatitis C antibody  Result Value Ref Range   Hepatitis C Ab NON-REACTIVE NON-REACTI   SIGNAL TO CUT-OFF 0.01 <1.00   Labs updated 8/20- ana also negative  Message to pt

## 2020-06-14 ENCOUNTER — Ambulatory Visit (INDEPENDENT_AMBULATORY_CARE_PROVIDER_SITE_OTHER): Payer: No Typology Code available for payment source | Admitting: Family Medicine

## 2020-06-14 ENCOUNTER — Encounter: Payer: Self-pay | Admitting: Family Medicine

## 2020-06-14 ENCOUNTER — Other Ambulatory Visit: Payer: Self-pay

## 2020-06-14 VITALS — BP 111/72 | HR 67 | Temp 98.0°F | Resp 13 | Ht 64.0 in | Wt 237.8 lb

## 2020-06-14 DIAGNOSIS — Z1329 Encounter for screening for other suspected endocrine disorder: Secondary | ICD-10-CM | POA: Diagnosis not present

## 2020-06-14 DIAGNOSIS — R21 Rash and other nonspecific skin eruption: Secondary | ICD-10-CM | POA: Diagnosis not present

## 2020-06-14 DIAGNOSIS — Z13220 Encounter for screening for lipoid disorders: Secondary | ICD-10-CM | POA: Diagnosis not present

## 2020-06-14 DIAGNOSIS — Z131 Encounter for screening for diabetes mellitus: Secondary | ICD-10-CM | POA: Diagnosis not present

## 2020-06-14 DIAGNOSIS — Z130 Encounter for screening for diseases of the blood and blood-forming organs and certain disorders involving the immune mechanism: Secondary | ICD-10-CM | POA: Diagnosis not present

## 2020-06-14 DIAGNOSIS — L301 Dyshidrosis [pompholyx]: Secondary | ICD-10-CM

## 2020-06-14 DIAGNOSIS — Z1159 Encounter for screening for other viral diseases: Secondary | ICD-10-CM

## 2020-06-14 LAB — COMPREHENSIVE METABOLIC PANEL
ALT: 11 U/L (ref 0–35)
AST: 15 U/L (ref 0–37)
Albumin: 3.9 g/dL (ref 3.5–5.2)
Alkaline Phosphatase: 85 U/L (ref 39–117)
BUN: 21 mg/dL (ref 6–23)
CO2: 26 mEq/L (ref 19–32)
Calcium: 8.9 mg/dL (ref 8.4–10.5)
Chloride: 104 mEq/L (ref 96–112)
Creatinine, Ser: 0.8 mg/dL (ref 0.40–1.20)
GFR: 78.75 mL/min (ref >60.00)
Glucose, Bld: 92 mg/dL (ref 70–99)
Potassium: 3.8 mEq/L (ref 3.5–5.1)
Sodium: 138 mEq/L (ref 135–145)
Total Bilirubin: 0.5 mg/dL (ref 0.2–1.2)
Total Protein: 6.6 g/dL (ref 6.0–8.3)

## 2020-06-14 LAB — CBC
HCT: 39.3 % (ref 36.0–46.0)
Hemoglobin: 13.2 g/dL (ref 12.0–15.0)
MCHC: 33.6 g/dL (ref 30.0–36.0)
MCV: 87.3 fl (ref 78.0–100.0)
Platelets: 226 10*3/uL (ref 150.0–400.0)
RBC: 4.5 Mil/uL (ref 3.87–5.11)
RDW: 13.7 % (ref 11.5–15.5)
WBC: 10.3 10*3/uL (ref 4.0–10.5)

## 2020-06-14 LAB — LIPID PANEL
Cholesterol: 123 mg/dL (ref 0–200)
HDL: 38.3 mg/dL — ABNORMAL LOW (ref >39.00)
LDL Cholesterol: 65 mg/dL (ref 0–99)
NonHDL: 85.17
Total CHOL/HDL Ratio: 3
Triglycerides: 99 mg/dL (ref 0.0–149.0)
VLDL: 19.8 mg/dL (ref 0.0–40.0)

## 2020-06-14 LAB — SEDIMENTATION RATE: Sed Rate: 9 mm/hr (ref 0–20)

## 2020-06-14 LAB — TSH: TSH: 1.84 u[IU]/mL (ref 0.35–4.50)

## 2020-06-14 LAB — HEMOGLOBIN A1C: Hgb A1c MFr Bld: 5.4 % (ref 4.6–6.5)

## 2020-06-14 LAB — C-REACTIVE PROTEIN: CRP: 1.2 mg/dL (ref 0.5–20.0)

## 2020-06-14 MED ORDER — TRIAMCINOLONE ACETONIDE 0.5 % EX OINT: 1.0000 "application " | TOPICAL_OINTMENT | Freq: Two times a day (BID) | CUTANEOUS | 1 refills | Status: DC

## 2020-06-14 MED FILL — TRIAMCINOLONE 0.5% OINTMENT: 0.5 | 15 days supply | Qty: 30 | Fill #0

## 2020-06-14 NOTE — Patient Instructions (Signed)
Good to see you again today!  I will be in touch with your labs asap Please let me know if you have more episodes of rash or other concerns.  If this happens again take photos of your skin! Try the triamcinolone ointment 1-2x daily as needed for your hands

## 2020-06-15 LAB — ANA: Anti Nuclear Antibody (ANA): NEGATIVE

## 2020-06-15 LAB — HEPATITIS C ANTIBODY
Hepatitis C Ab: NONREACTIVE
SIGNAL TO CUT-OFF: 0.01 (ref <1.00)

## 2020-08-01 MED FILL — HYDROCHLOROTHIAZIDE 12.5 MG: 12.5 | 60 days supply | Qty: 60 | Fill #2

## 2020-09-21 NOTE — Progress Notes (Signed)
[facility] [address], [address] [place], [place] [address] [phone]  Date:  09/25/2020   Name:  Stephanie Long   DOB:  April 05, 1976   MRN:  [id number]  PCP:  Stephanie Cables, MD    Chief Complaint: Annual Exam (sees gyn pap)   History of Present Illness:  Stephanie Long is a 44 y.o. very pleasant female patient who presents with the following:  Pt here today for a CPE- she has mild HTN  Last seen by myself in august of this year  History of asthma and plantar fascitis   Pap per gyn mammo per her GYN, UTD and normal  covid vaccine- done including booster Flu shot- done   Labs done in August- all ok - do not need to get labs today   She is using hctz for her BP- she would like to try   She has lost 25 lbs- she is getting more exercise, she is doing boot camp workouts and really enjoying them and the progress she is making  For blood pressure she is currently taking hctz 12.5 she has lost weight and notes that her blood pressure has been under good control.  She wonders about stopping HCTZ Omeprazole prn- she does not use that much  She has history of dyshidrotic eczema mostly on her hands  We have tried triamcinolone ointment -unfortunately her symptoms continue to be an issue She recently talked to someone who works in eczema clinical trials, she suggested clobetasol which we can certainly try  Married to Stephanie Long from Last 3 Encounters:  09/25/20 229 lb (103.9 kg)  06/14/20 237 lb 12.8 oz (107.9 kg)  06/23/19 250 lb (113.4 kg)   BP Readings from Last 3 Encounters:  09/25/20 118/72  06/14/20 111/72  06/23/19 (!) 150/90       Patient Active Problem List   Diagnosis Date Noted  . Right knee pain 10/09/2017  . Right foot pain 10/09/2017  . Plantar fasciitis of right foot 10/16/2016  . Right shoulder pain 10/16/2016  . Obesity 10/07/2016  . DERMATITIS, OTHER ATOPIC 03/24/2007   . Mild persistent asthma without complication 03/10/2007    Past Medical History:  Diagnosis Date  . Allergy   . Asthma   . GERD (gastroesophageal reflux disease)   . Gestational diabetes   . Gestational hypertension 05/09/2013  . Hx of varicella   . PCOS (polycystic ovarian syndrome) 2012  . Plantar fasciitis   . Symptomatic cholelithiasis     Past Surgical History:  Procedure Laterality Date  . CESAREAN SECTION N/A 05/09/2013   Procedure: primary cesarean section with delivery of baby girl at 1800.  Apgars 9/9.;  Surgeon: Stephanie Aden, MD;  Location: [facility];  Service: Obstetrics;  Laterality: N/A;  . CHOLECYSTECTOMY N/A 06/08/2018   Procedure: LAPAROSCOPIC CHOLECYSTECTOMY;  Surgeon: Stephanie Gelinas, MD;  Location: [facility];  Service: General;  Laterality: N/A;  . WISDOM TOOTH EXTRACTION    . WISDOM TOOTH EXTRACTION      Social History   Tobacco Use  . Smoking status: Never Smoker  . Smokeless tobacco: Never Used  Vaping Use  . Vaping Use: Never used  Substance Use Topics  . Alcohol use: Yes    Alcohol/week: 2.0 standard drinks    Types: 1 Glasses of wine, 1 Standard drinks or equivalent per week  . Drug use: No    Family History  Problem Relation Age  of Onset  . Diabetes Father   . Heart disease Father   . Hyperlipidemia Father   . Heart attack Father   . Depression Mother   . Stroke Maternal Grandmother   . Cancer Neg Hx   . Asthma Neg Hx   . Early death Neg Hx     No Known Allergies  Medication list has been reviewed and updated.  Current Outpatient Medications on File Prior to Visit  Medication Sig Dispense Refill  . Acetaminophen (TYLENOL PO) Take 1 tablet by mouth as needed.    . calcium carbonate (TUMS - DOSED IN MG ELEMENTAL CALCIUM) 500 MG chewable tablet Chew 1 tablet by mouth daily as needed for indigestion or heartburn.     . cetirizine (ZYRTEC) 10 MG tablet Take 10 mg by mouth daily as needed.     . diclofenac sodium (VOLTAREN) 1 % GEL Apply  4 g topically 4 (four) times daily. Apply to right knee 4 grams 4 times per day and right thumb 2 grams 4 times per day 50 g 1  . Glucosamine 500 MG CAPS Take 1,000 mg by mouth daily.    . hydrochlorothiazide (MICROZIDE) 12.5 MG capsule TAKE 1 CAPSULE BY MOUTH ONCE A DAY 30 capsule 5  . IBUPROFEN PO Take 1 tablet by mouth as needed.    . Melatonin 5 MG TABS Take by mouth.    . Multiple Vitamin (MULTIVITAMIN) tablet Take 1 tablet by mouth daily.    Marland Kitchen omeprazole (PRILOSEC) 40 MG capsule TAKE 1 CAPSULE BY MOUTH ONCE A DAY 30 capsule 3  . triamcinolone ointment (KENALOG) 0.5 % Apply 1 application topically 2 (two) times daily. Use as needed for hand eczema 30 g 1  . VENTOLIN HFA 108 (90 Base) MCG/ACT inhaler INHALE 2 PUFFS INTO THE LUNGS EVERY 6 HOURS AS NEEDED FOR WHEEZING OR SHORTNESS OF BREATH. 18 g 6  . VITAMIN D PO Take by mouth.     No current facility-administered medications on file prior to visit.    Review of Systems:  As per HPI- otherwise negative.   Physical Examination: Vitals:   09/25/20 1033  BP: 118/72  Pulse: 65  Resp: 17  SpO2: 99%   Vitals:   09/25/20 1033  Weight: 229 lb (103.9 kg)  Height: 5\' 4"  (1.626 m)   Body mass index is 39.31 kg/m. Ideal Body Weight: Weight in (lb) to have BMI = 25: 145.3  GEN: no acute distress.  Obese but has lost weight, looks well HEENT: Atraumatic, Normocephalic.   Bilateral TM wnl, oropharynx normal.  PEERL,EOMI.   Ears and Nose: No external deformity. CV: RRR, No M/G/R. No JVD. No thrill. No extra heart sounds. PULM: CTA B, no wheezes, crackles, rhonchi. No retractions. No resp. distress. No accessory muscle use. ABD: S, NT, ND, +BS. No rebound. No HSM. EXTR: No c/c/e PSYCH: Normally interactive. Conversant.  Dyshidrotic eczema is present on both palms  Assessment and Plan: Physical exam  Eczema, unspecified type - Plan: clobetasol ointment (TEMOVATE) 0.05 %  Blood pressure elevated without history of HTN  Recent  weight loss  Patient here today for a physical exam Encouraged continued good work with diet, exercise, weight loss She has a blood pressure cuff at home.  She can continue to monitor her blood pressure, okay to try stopping hydrochlorothiazide-she may no longer need this medication We will have her try clobetasol as needed on her hands for eczema Labs and health maintenance otherwise up-to-date This visit occurred during  the SARS-CoV-2 public health emergency.  Safety protocols were in place, including screening questions prior to the visit, additional usage of staff PPE, and extensive cleaning of exam room while observing appropriate contact time as indicated for disinfecting solutions.    Signed Lamar Blinks, MD

## 2020-09-21 NOTE — Patient Instructions (Addendum)
Good to see you again today! Great job with your weight and exercise!   As long as your BP is generally under 130/85 ok to stop hctz You can try the clobetasol as needed for your hands   Health Maintenance, Female Adopting a healthy lifestyle and getting preventive care are important in promoting health and wellness. Ask your health care provider about:  The right schedule for you to have regular tests and exams.  Things you can do on your own to prevent diseases and keep yourself healthy. What should I know about diet, weight, and exercise? Eat a healthy diet   Eat a diet that includes plenty of vegetables, fruits, low-fat dairy products, and lean protein.  Do not eat a lot of foods that are high in solid fats, added sugars, or sodium. Maintain a healthy weight Body mass index (BMI) is used to identify weight problems. It estimates body fat based on height and weight. Your health care provider can help determine your BMI and help you achieve or maintain a healthy weight. Get regular exercise Get regular exercise. This is one of the most important things you can do for your health. Most adults should:  Exercise for at least 150 minutes each week. The exercise should increase your heart rate and make you sweat (moderate-intensity exercise).  Do strengthening exercises at least twice a week. This is in addition to the moderate-intensity exercise.  Spend less time sitting. Even light physical activity can be beneficial. Watch cholesterol and blood lipids Have your blood tested for lipids and cholesterol at 43 years of age, then have this test every 5 years. Have your cholesterol levels checked more often if:  Your lipid or cholesterol levels are high.  You are older than 42 years of age.  You are at high risk for heart disease. What should I know about cancer screening? Depending on your health history and family history, you may need to have cancer screening at various ages. This  may include screening for:  Breast cancer.  Cervical cancer.  Colorectal cancer.  Skin cancer.  Lung cancer. What should I know about heart disease, diabetes, and high blood pressure? Blood pressure and heart disease  High blood pressure causes heart disease and increases the risk of stroke. This is more likely to develop in people who have high blood pressure readings, are of African descent, or are overweight.  Have your blood pressure checked: ? Every 3-5 years if you are 69-59 years of age. ? Every year if you are 43 years old or older. Diabetes Have regular diabetes screenings. This checks your fasting blood sugar level. Have the screening done:  Once every three years after age 63 if you are at a normal weight and have a low risk for diabetes.  More often and at a younger age if you are overweight or have a high risk for diabetes. What should I know about preventing infection? Hepatitis B If you have a higher risk for hepatitis B, you should be screened for this virus. Talk with your health care provider to find out if you are at risk for hepatitis B infection. Hepatitis C Testing is recommended for:  Everyone born from [birth year] through 1963.  Anyone with known risk factors for hepatitis C. Sexually transmitted infections (STIs)  Get screened for STIs, including gonorrhea and chlamydia, if: ? You are sexually active and are younger than 42 years of age. ? You are older than 42 years of age and your health care  provider tells you that you are at risk for this type of infection. ? Your sexual activity has changed since you were last screened, and you are at increased risk for chlamydia or gonorrhea. Ask your health care provider if you are at risk.  Ask your health care provider about whether you are at high risk for HIV. Your health care provider may recommend a prescription medicine to help prevent HIV infection. If you choose to take medicine to prevent HIV, you should  first get tested for HIV. You should then be tested every 3 months for as long as you are taking the medicine. Pregnancy  If you are about to stop having your period (premenopausal) and you may become pregnant, seek counseling before you get pregnant.  Take 400 to 800 micrograms (mcg) of folic acid every day if you become pregnant.  Ask for birth control (contraception) if you want to prevent pregnancy. Osteoporosis and menopause Osteoporosis is a disease in which the bones lose minerals and strength with aging. This can result in bone fractures. If you are 19 years old or older, or if you are at risk for osteoporosis and fractures, ask your health care provider if you should:  Be screened for bone loss.  Take a calcium or vitamin D supplement to lower your risk of fractures.  Be given hormone replacement therapy (HRT) to treat symptoms of menopause. Follow these instructions at home: Lifestyle  Do not use any products that contain nicotine or tobacco, such as cigarettes, e-cigarettes, and chewing tobacco. If you need help quitting, ask your health care provider.  Do not use street drugs.  Do not share needles.  Ask your health care provider for help if you need support or information about quitting drugs. Alcohol use  Do not drink alcohol if: ? Your health care provider tells you not to drink. ? You are pregnant, may be pregnant, or are planning to become pregnant.  If you drink alcohol: ? Limit how much you use to 0-1 drink a day. ? Limit intake if you are breastfeeding.  Be aware of how much alcohol is in your drink. In the [place] one drink equals one 12 oz bottle of beer (355 mL), one 5 oz glass of wine (148 mL), or one 1 oz glass of hard liquor (44 mL). General instructions  Schedule regular health, dental, and eye exams.  Stay current with your vaccines.  Tell your health care provider if: ? You often feel depressed. ? You have ever been abused or do not feel safe  at home. Summary  Adopting a healthy lifestyle and getting preventive care are important in promoting health and wellness.  Follow your health care provider's instructions about healthy diet, exercising, and getting tested or screened for diseases.  Follow your health care provider's instructions on monitoring your cholesterol and blood pressure. This information is not intended to replace advice given to you by your health care provider. Make sure you discuss any questions you have with your health care provider. Document Revised: 10/07/2018 Document Reviewed: 10/07/2018 Elsevier Patient Education  2020 [employer].

## 2020-09-25 ENCOUNTER — Other Ambulatory Visit: Payer: Self-pay | Admitting: Family Medicine

## 2020-09-25 ENCOUNTER — Ambulatory Visit (INDEPENDENT_AMBULATORY_CARE_PROVIDER_SITE_OTHER): Payer: No Typology Code available for payment source | Admitting: Family Medicine

## 2020-09-25 ENCOUNTER — Other Ambulatory Visit: Payer: Self-pay

## 2020-09-25 ENCOUNTER — Encounter: Payer: Self-pay | Admitting: Family Medicine

## 2020-09-25 VITALS — BP 118/72 | HR 65 | Resp 17 | Ht 64.0 in | Wt 229.0 lb

## 2020-09-25 DIAGNOSIS — L309 Dermatitis, unspecified: Secondary | ICD-10-CM

## 2020-09-25 DIAGNOSIS — Z0000 Encounter for general adult medical examination without abnormal findings: Secondary | ICD-10-CM

## 2020-09-25 DIAGNOSIS — R030 Elevated blood-pressure reading, without diagnosis of hypertension: Secondary | ICD-10-CM

## 2020-09-25 DIAGNOSIS — R634 Abnormal weight loss: Secondary | ICD-10-CM

## 2020-09-25 MED ORDER — CLOBETASOL PROPIONATE 0.05 % EX OINT: 1.0000 "application " | TOPICAL_OINTMENT | Freq: Two times a day (BID) | CUTANEOUS | 0 refills | Status: DC

## 2020-09-25 MED FILL — CLOBETASOL PROPIONATE 0.05: 0.05 | 14 days supply | Qty: 45 | Fill #0

## 2020-10-25 ENCOUNTER — Encounter: Payer: Self-pay | Admitting: Family Medicine

## 2020-10-26 ENCOUNTER — Other Ambulatory Visit: Payer: Self-pay | Admitting: Family Medicine

## 2020-10-26 DIAGNOSIS — R030 Elevated blood-pressure reading, without diagnosis of hypertension: Secondary | ICD-10-CM

## 2020-10-26 MED FILL — HYDROCHLOROTHIAZIDE 12.5 MG: 12.5 | 90 days supply | Qty: 90 | Fill #0

## 2021-01-16 ENCOUNTER — Other Ambulatory Visit (HOSPITAL_BASED_OUTPATIENT_CLINIC_OR_DEPARTMENT_OTHER): Payer: Self-pay

## 2021-01-29 MED FILL — Hydrochlorothiazide Cap 12.5 MG: ORAL | 90 days supply | Qty: 90 | Fill #0 | Status: AC

## 2021-01-30 ENCOUNTER — Other Ambulatory Visit (HOSPITAL_COMMUNITY): Payer: Self-pay

## 2021-04-09 ENCOUNTER — Encounter: Payer: Self-pay | Admitting: Family Medicine

## 2021-04-09 DIAGNOSIS — L309 Dermatitis, unspecified: Secondary | ICD-10-CM

## 2021-04-09 DIAGNOSIS — M25561 Pain in right knee: Secondary | ICD-10-CM

## 2021-04-10 ENCOUNTER — Other Ambulatory Visit (HOSPITAL_COMMUNITY): Payer: Self-pay

## 2021-04-10 MED ORDER — DICLOFENAC SODIUM 50 MG PO TBEC
DELAYED_RELEASE_TABLET | ORAL | 1 refills | Status: DC
  Filled 2021-04-10: qty 60, 30d supply, fill #0

## 2021-04-10 MED ORDER — PREDNISONE 10 MG (21) PO TBPK
ORAL_TABLET | ORAL | 0 refills | Status: DC
  Filled 2021-04-10: qty 21, 6d supply, fill #0

## 2021-04-16 ENCOUNTER — Other Ambulatory Visit: Payer: Self-pay

## 2021-04-16 ENCOUNTER — Ambulatory Visit: Payer: No Typology Code available for payment source | Admitting: Orthopaedic Surgery

## 2021-04-16 ENCOUNTER — Ambulatory Visit (INDEPENDENT_AMBULATORY_CARE_PROVIDER_SITE_OTHER): Payer: No Typology Code available for payment source

## 2021-04-16 VITALS — Ht 64.0 in

## 2021-04-16 DIAGNOSIS — G8929 Other chronic pain: Secondary | ICD-10-CM | POA: Diagnosis not present

## 2021-04-16 DIAGNOSIS — M25561 Pain in right knee: Secondary | ICD-10-CM

## 2021-04-16 DIAGNOSIS — M5441 Lumbago with sciatica, right side: Secondary | ICD-10-CM | POA: Diagnosis not present

## 2021-04-16 NOTE — Progress Notes (Signed)
Office Visit Note   Patient: Stephanie Long           Date of Birth: Jun 02, 1976           MRN: [id number] Visit Date: 04/16/2021              Requested by: Pearline Cables, MD [address] [place],  [place] [address] PCP: Pearline Cables, MD   Assessment & Plan: Visit Diagnoses:  1. Acute pain of right knee   2. Chronic right-sided low back pain with right-sided sciatica     Plan: I talked to the patient in length about her treatment plan.  At this point given her continued right knee issues combined with locking and catching in the failed conservative treatment, a MRI of the right knee is warranted to rule out a meniscal tear.  She is already failed conservative treatment with steroid injections as well as anti-inflammatories and strengthening exercises for over 12 months now.  Given her mechanical symptoms and positive mCMurray sign, a MRI of the right knee is warranted.  I would also like to send her to outpatient physical therapy to work on her low back to the right side and sciatic area with a low back program as well as a program for strengthening her knees.  All questions and concerns were answered and addressed.  We will see her back after the MRI of her right knee.  Follow-Up Instructions: No follow-ups on file.   Orders:  Orders Placed This Encounter  Procedures   XR Knee 1-2 Views Right   No orders of the defined types were placed in this encounter.     Procedures: No procedures performed   Clinical Data: No additional findings.   Subjective: Chief Complaint  Patient presents with   Right Knee - Pain  The patient comes in with a chief complaint of right knee pain but also right-sided sciatica and low back pain.  She has been seen numerous times for her right knee.  She has been on a weight loss journey and has been attending [facility] and has done incredibly well with her weight loss.  She has been having medial joint line tenderness  of that knee and has had injections in the past for her right knee.  She has been through activity modification and trying anti-inflammatories.  She had a flareup of pain last week and she went to an urgent care center.  She was started on prednisone and finished that today and is starting diclofenac.  She still has locking and catching with the right knee and medial joint line tenderness.  She is also been having some low back pain to the right side and sciatic symptoms.  She is not a diabetic.  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  She has tried and failed conservative treatment for these issues for well over 12 months.  HPI  Review of Systems There is currently listed no headache, chest pain, shortness of breath, fever, chills, nausea, vomiting  Objective: Vital Signs: Ht 5\' 4"  (1.626 m)   BMI 39.31 kg/m   Physical Exam She is alert and orient x3 and in no acute distress Ortho Exam Examination of her right knee shows significant medial joint line tenderness.  The knee is ligamentously stable but there is a positive McMurray's exam to the medial compartment.  She has a negative straight leg raise on that side but definitely signs of low back  pain to the right side and pain on static stretch.  She is neurovascularly intact on the right lower extremity. Specialty Comments:  No specialty comments available.  Imaging: XR Knee 1-2 Views Right  Result Date: 04/16/2021 X-rays of the right knee show well-maintained joint space and no acute findings.  These are compared to previous films.    PMFS History: Patient Active Problem List   Diagnosis Date Noted   Right knee pain 10/09/2017   Right foot pain 10/09/2017   Plantar fasciitis of right foot 10/16/2016   Right shoulder pain 10/16/2016   Obesity 10/07/2016   DERMATITIS, OTHER ATOPIC 03/24/2007   Mild persistent asthma without complication 03/10/2007   Past Medical History:  Diagnosis Date   Allergy    Asthma     GERD (gastroesophageal reflux disease)    Gestational diabetes    Gestational hypertension 05/09/2013   Hx of varicella    PCOS (polycystic ovarian syndrome) 2012   Plantar fasciitis    Symptomatic cholelithiasis     Family History  Problem Relation Age of Onset   Diabetes Father    Heart disease Father    Hyperlipidemia Father    Heart attack Father    Depression Mother    Stroke Maternal Grandmother    Cancer Neg Hx    Asthma Neg Hx    Early death Neg Hx     Past Surgical History:  Procedure Laterality Date   CESAREAN SECTION N/A 05/09/2013   Procedure: primary cesarean section with delivery of baby girl at 1800.  Apgars 9/9.;  Surgeon: Lenoard Aden, MD;  Location: [facility];  Service: Obstetrics;  Laterality: N/A;   CHOLECYSTECTOMY N/A 06/08/2018   Procedure: LAPAROSCOPIC CHOLECYSTECTOMY;  Surgeon: Violeta Gelinas, MD;  Location: [facility];  Service: General;  Laterality: N/A;   WISDOM TOOTH EXTRACTION     WISDOM TOOTH EXTRACTION     Social History   Occupational History   Not on file  Tobacco Use   Smoking status: Never   Smokeless tobacco: Never  Vaping Use   Vaping Use: Never used  Substance and Sexual Activity   Alcohol use: Yes    Alcohol/week: 2.0 standard drinks    Types: 1 Glasses of wine, 1 Standard drinks or equivalent per week   Drug use: No   Sexual activity: Yes    Partners: Male

## 2021-04-27 ENCOUNTER — Other Ambulatory Visit (HOSPITAL_COMMUNITY): Payer: Self-pay

## 2021-04-27 MED FILL — Hydrochlorothiazide Cap 12.5 MG: ORAL | 90 days supply | Qty: 90 | Fill #1 | Status: AC

## 2021-05-02 ENCOUNTER — Encounter: Payer: Self-pay | Admitting: Rehabilitative and Restorative Service Providers"

## 2021-05-02 ENCOUNTER — Other Ambulatory Visit: Payer: Self-pay

## 2021-05-02 ENCOUNTER — Ambulatory Visit: Payer: No Typology Code available for payment source | Admitting: Rehabilitative and Restorative Service Providers"

## 2021-05-02 DIAGNOSIS — M25551 Pain in right hip: Secondary | ICD-10-CM

## 2021-05-02 DIAGNOSIS — M25561 Pain in right knee: Secondary | ICD-10-CM

## 2021-05-02 DIAGNOSIS — G8929 Other chronic pain: Secondary | ICD-10-CM | POA: Diagnosis not present

## 2021-05-02 DIAGNOSIS — M6281 Muscle weakness (generalized): Secondary | ICD-10-CM | POA: Diagnosis not present

## 2021-05-02 NOTE — Addendum Note (Signed)
Addended by: Charlean Merl on: 05/02/2021 01:16 PM   Modules accepted: Orders

## 2021-05-02 NOTE — Therapy (Signed)
[facility] Physical Therapy [address] [place], [place], [address] Phone: [phone]   Fax:  [phone]  Physical Therapy Evaluation  Patient Details  Name: Stephanie Long MRN: [id number] Date of Birth: 1976/07/19 Referring Provider (PT): Dr. Magnus Ivan   Encounter Date: 05/02/2021   PT End of Session - 05/02/21 0933     Visit Number 1    Number of Visits 10    Date for PT Re-Evaluation 06/08/21    PT Start Time 0845    PT Stop Time 0930    PT Time Calculation (min) 45 min    Activity Tolerance Patient tolerated treatment well;No increased pain    Behavior During Therapy [facility] for tasks assessed/performed             Past Medical History:  Diagnosis Date   Allergy    Asthma    GERD (gastroesophageal reflux disease)    Gestational diabetes    Gestational hypertension 05/09/2013   Hx of varicella    PCOS (polycystic ovarian syndrome) 2012   Plantar fasciitis    Symptomatic cholelithiasis     Past Surgical History:  Procedure Laterality Date   CESAREAN SECTION N/A 05/09/2013   Procedure: primary cesarean section with delivery of baby girl at 1800.  Apgars 9/9.;  Surgeon: Lenoard Aden, MD;  Location: [facility];  Service: Obstetrics;  Laterality: N/A;   CHOLECYSTECTOMY N/A 06/08/2018   Procedure: LAPAROSCOPIC CHOLECYSTECTOMY;  Surgeon: Violeta Gelinas, MD;  Location: [facility] OR;  Service: General;  Laterality: N/A;   WISDOM TOOTH EXTRACTION     WISDOM TOOTH EXTRACTION      There were no vitals filed for this visit.    Subjective Assessment - 05/02/21 0854     Subjective R knee pain; works out at [employer] and hasn't worked out in 3 weeks. MRI scheduled 05/03/21 R knee. Dr. thinks it may be meniscal.    Limitations Sitting;House hold activities;Walking    How long can you sit comfortably? indeterminate amount of time    How long can you stand comfortably? indeterminate amount of time    How long can you walk comfortably? indeterminate  amount of time; works 12 hour shifts    Patient Stated Goals to be able to workout again    Currently in Pain? Yes    Pain Score 2     Pain Location Leg    Pain Orientation Right    Pain Descriptors / Indicators Aching    Pain Type Acute pain    Pain Radiating Towards outer thigh    Pain Onset 1 to 4 weeks ago    Pain Frequency Intermittent    Aggravating Factors  working out    Pain Relieving Factors rest    Multiple Pain Sites No                [facility] PT Assessment - 05/02/21 0001       Assessment   Medical Diagnosis R knee pain, LBP    Referring Provider (PT) Dr. Magnus Ivan    Onset Date/Surgical Date --   on/off duration but increased several weeks ago in R knee   Hand Dominance Right    Next MD Visit 05/09/21    Prior Therapy n/a      Precautions   Precautions None      Restrictions   Weight Bearing Restrictions No      Balance Screen   Has the patient fallen in the past 6 months No  Has the patient had a decrease in activity level because of a fear of falling?  No    Is the patient reluctant to leave their home because of a fear of falling?  No      Home [employer]ist information centre manager residence      Prior Function   Level of Independence Independent      Cognition   Overall Cognitive Status Within Functional Limits for tasks assessed      Observation/Other Assessments   Focus on Therapeutic Outcomes (FOTO)  77% fxn      Coordination   Gross Motor Movements are Fluid and Coordinated Yes      ROM / Strength   AROM / PROM / Strength Strength;AROM;PROM      AROM   Overall AROM Comments knee AROM WFL      PROM   Overall PROM Comments knee/Hip PROM WNL      Strength   Overall Strength Comments L LE strength 4+/5 throughout, R hip abdct/ext 4/5, all others 5/5      Palpation   Patella mobility good; however, with R quad set, pt requires more muscle recruitement in order to perform including ankle DF    Palpation comment R gluteal fold  lower, R glute tighter, ASIS =, coxa vara, genu valga R > L, increased R foot pronation      Special Tests   Other special tests McMurrays, Pivot Shift, Ant/Post Lachmans -, SLR/Thomas + for tightness                        Objective measurements completed on examination: See above findings.               PT Education - 05/02/21 1300     Education Details PT reviewed evaluative findings    Person(s) Educated Patient    Methods Explanation    Comprehension Verbalized understanding                 PT Long Term Goals - 05/02/21 1307       PT LONG TERM GOAL #1   Title Pt will be indep with advanced HEP for R knee pain management in prep for DC    Baseline not issued at eval    Time 5    Period Weeks    Status New    Target Date 06/08/21      PT LONG TERM GOAL #2   Title Pt will be able to perform Burn [facility] with </= 2/10 R knee pain    Baseline has not attended x 3 weeks    Time 5    Period Weeks    Status New    Target Date 06/08/21      PT LONG TERM GOAL #3   Title Pt will be able to return to all functional activities without R knee pain    Baseline up to 6/10 R knee pain radiating up to R hip    Time 5    Period Weeks    Status New    Target Date 06/08/21      PT LONG TERM GOAL #4   Title Pt will improve Foto score to 83% function    Baseline 77%    Time 5    Period Weeks    Status New    Target Date 06/08/21      PT LONG TERM GOAL #5   Title Pt will demo improved  R hip abdct/ext strength to 5/5 to assist with return to boot camp    Baseline R hip abdct/ext 4/5, all others 5/5    Time 5    Period Weeks    Status New    Target Date 06/08/21                    Plan - 05/02/21 1301     Clinical Impression Statement Pt referred to PT for R knee pain with potential meniscal integrity concerns; however, none are found at evaluation and pt denies popping, clicking, locking with movement or after sitting. She does  have an MRI scheduled for 05/03/21 for her R knee. She also demonstrates gluteal tightness R with abnormal weightbearing posturing with increased R genu valga and increased R pronation thereby increasing R medial knee torque. Pt would benefit from further PT to address R gluteal tightness, R hamstring/hip flexor tightness, improved R quad activation with proper muscle facilitation, manual therapy and modalities as needed.    Examination-Activity Limitations Squat;Lift;Stand;Other   working out   [employer];Occupation    Stability/Clinical Decision Making Stable/Uncomplicated    Clinical Decision Making Low    Rehab Potential Excellent    PT Frequency 2x / week    PT Duration --   5 weeks   PT Treatment/Interventions Cryotherapy;Ultrasound;Moist Heat;Iontophoresis 4mg /ml Dexamethasone;Electrical Stimulation;Gait training;Stair training;Balance training;Therapeutic exercise;Therapeutic activities;Functional mobility training;Neuromuscular re-education;Patient/family education;Manual techniques;Passive range of motion;Dry needling;Taping    PT Next Visit Plan Issue HEP for quad strengthening if able to perform good technique quad contraction, hip flex/HS stretching; check MRI results. R LE strengthening    Consulted and Agree with Plan of Care Patient             Patient will benefit from skilled therapeutic intervention in order to improve the following deficits and impairments:  Decreased activity tolerance, Decreased strength, Impaired flexibility, Postural dysfunction, Pain  Visit Diagnosis: Chronic pain of right knee  Pain in right hip  Muscle weakness (generalized)     Problem List Patient Active Problem List   Diagnosis Date Noted   Right knee pain 10/09/2017   Right foot pain 10/09/2017   Plantar fasciitis of right foot 10/16/2016   Right shoulder pain 10/16/2016   Obesity 10/07/2016   DERMATITIS, OTHER ATOPIC 03/24/2007   Mild  persistent asthma without complication 03/10/2007    03/12/2007, PT, DPT 05/02/2021, 1:14 PM  [facility] Physical Therapy [address] [place], [place], [place] Phone: [phone]   Fax:  [phone]  Name: Stephanie Long MRN: Curlene Labrum Date of Birth: Mar 07, 1976

## 2021-05-03 ENCOUNTER — Ambulatory Visit
Admission: RE | Admit: 2021-05-03 | Discharge: 2021-05-03 | Disposition: A | Discharge status: Home / Self Care | Payer: No Typology Code available for payment source | Source: Ambulatory Visit | Attending: Orthopaedic Surgery | Admitting: Orthopaedic Surgery

## 2021-05-03 DIAGNOSIS — M25561 Pain in right knee: Secondary | ICD-10-CM

## 2021-05-08 ENCOUNTER — Encounter: Payer: No Typology Code available for payment source | Admitting: Physical Therapy

## 2021-05-09 ENCOUNTER — Ambulatory Visit: Payer: No Typology Code available for payment source | Admitting: Orthopaedic Surgery

## 2021-05-09 ENCOUNTER — Other Ambulatory Visit: Payer: Self-pay

## 2021-05-09 ENCOUNTER — Encounter: Payer: Self-pay | Admitting: Orthopaedic Surgery

## 2021-05-09 DIAGNOSIS — S83241D Other tear of medial meniscus, current injury, right knee, subsequent encounter: Secondary | ICD-10-CM | POA: Diagnosis not present

## 2021-05-09 DIAGNOSIS — M25561 Pain in right knee: Secondary | ICD-10-CM | POA: Diagnosis not present

## 2021-05-09 NOTE — Progress Notes (Signed)
Stephanie Long comes in today to go over the MRI of her right knee.  She is a very active 45 year old female and been having some right knee pain issues.  She has had at least an evaluation by physical therapy upstairs.  They have talked about the potential for orthotics at some point.  She does have right knee pain but no locking and catching.  There is some IT band pain associated with this but also some right-sided low back pain.  Since her right knee was having significant issues, we felt felt a MRI was appropriate to assess the internal structures of the right knee as well as the meniscus.  Also, she has been in a boot camp and doing aggressive working out in order to get in shape and lose weight.  We were needing guidance on what to allow her to do in that type of boot camp with also protecting her knee.  She denies any locking catching of the right knee.  It has been more of a dull aching pain.  A lot of her pain is also in the low back and IT band.  She is very pleased with her first physical therapy evaluation. The MRI of her right knee does show degenerative meniscal tear but no meniscal fragments.  It is nondisplaced.  There may be a little bit of involvement of the root of the meniscus.  There is only mild cartilage thinning in the medial compartment the knee.  There is a small cartilage defect on the lateral tibial plateau but no lateral meniscal tear.  ACL PCL are intact.  I have recommended continued physical therapy for strengthening her right knee as well as work on the IT band and her low back.  I do feel that orthotics would be appropriate.  I would hold off on any type of surgical intervention based on the MRI findings and based on her clinical exam of her right knee as well as her signs and symptoms.  Obviously if she develops mechanical symptoms, an arthroscopic intervention of the right knee would be warranted but it is not right now.  I do feel that is appropriate for her to protect her knee  as she gets back into the boot camp activities.  All questions and concerns were answered and addressed.  I would like to see her back in 6 weeks for repeat evaluation.

## 2021-05-15 ENCOUNTER — Encounter: Payer: Self-pay | Admitting: Physical Therapy

## 2021-05-15 ENCOUNTER — Ambulatory Visit: Payer: No Typology Code available for payment source | Admitting: Physical Therapy

## 2021-05-15 ENCOUNTER — Other Ambulatory Visit: Payer: Self-pay

## 2021-05-15 DIAGNOSIS — G8929 Other chronic pain: Secondary | ICD-10-CM

## 2021-05-15 DIAGNOSIS — M25561 Pain in right knee: Secondary | ICD-10-CM

## 2021-05-15 DIAGNOSIS — M25551 Pain in right hip: Secondary | ICD-10-CM

## 2021-05-15 DIAGNOSIS — M6281 Muscle weakness (generalized): Secondary | ICD-10-CM | POA: Diagnosis not present

## 2021-05-15 NOTE — Patient Instructions (Signed)
Access Code: [email or web address] URL: [email or web address] Date: 05/15/2021 Prepared by: Ivery Quale  Exercises Supine Quadriceps Stretch with Strap on Table - 2 x daily - 6 x weekly - 2-3 reps - 30 hold Supine ITB Stretch with Strap - 2 x daily - 6 x weekly - 2-3 reps - 30 hold Figure 4 Bridge - 2 x daily - 6 x weekly - 1-2 sets - 10 reps Seated Straight Leg Heel Taps - 2 x daily - 6 x weekly - 3 sets - 10 reps Standing Partial Lunge - 2 x daily - 6 x weekly - 3 sets - 10 reps Sidestepping in Squat with Resistance and Arms Forward - 2 x daily - 6 x weekly - 2 sets - 10 reps Forward Monster Walks - 2 x daily - 6 x weekly - 2 sets - 10 reps

## 2021-05-15 NOTE — Therapy (Signed)
[facility] Physical Therapy [address] [place], [place], [address] Phone: [phone]   Fax:  [phone]  Physical Therapy Treatment  Patient Details  Name: Stephanie Long MRN: [id number] Date of Birth: 09/08/1976 Referring Provider (PT): Dr. Magnus Ivan   Encounter Date: 05/15/2021   PT End of Session - 05/15/21 0841     Visit Number 2    Number of Visits 10    Date for PT Re-Evaluation 06/08/21    PT Start Time 0804    PT Stop Time 0845    PT Time Calculation (min) 41 min    Activity Tolerance Patient tolerated treatment well;No increased pain    Behavior During Therapy [facility] for tasks assessed/performed             Past Medical History:  Diagnosis Date   Allergy    Asthma    GERD (gastroesophageal reflux disease)    Gestational diabetes    Gestational hypertension 05/09/2013   Hx of varicella    PCOS (polycystic ovarian syndrome) 2012   Plantar fasciitis    Symptomatic cholelithiasis     Past Surgical History:  Procedure Laterality Date   CESAREAN SECTION N/A 05/09/2013   Procedure: primary cesarean section with delivery of baby girl at 1800.  Apgars 9/9.;  Surgeon: Lenoard Aden, MD;  Location: [facility];  Service: Obstetrics;  Laterality: N/A;   CHOLECYSTECTOMY N/A 06/08/2018   Procedure: LAPAROSCOPIC CHOLECYSTECTOMY;  Surgeon: Violeta Gelinas, MD;  Location: [facility] OR;  Service: General;  Laterality: N/A;   WISDOM TOOTH EXTRACTION     WISDOM TOOTH EXTRACTION      There were no vitals filed for this visit.   Subjective Assessment - 05/15/21 0809     Subjective R knee pain is about 2-3 overall, some pain in her burn boot camp workout but tries to modify exercises as able.    Limitations Sitting;House hold activities;Walking    How long can you sit comfortably? indeterminate amount of time    How long can you stand comfortably? indeterminate amount of time    How long can you walk comfortably? indeterminate amount of time; works 12  hour shifts    Patient Stated Goals to be able to workout again    Pain Descriptors / Indicators Aching    Pain Onset 1 to 4 weeks ago                               [facility] Adult PT Treatment/Exercise - 05/15/21 0001       Exercises   Exercises Knee/Hip      Knee/Hip Exercises: Stretches   Quad Stretch Right;2 reps;30 seconds    ITB Stretch Right;2 reps;30 seconds      Knee/Hip Exercises: Aerobic   Nustep 8 min L6 LE only      Knee/Hip Exercises: Machines for Strengthening   Cybex Knee Extension 10# 2X15 bilat    Cybex Knee Flexion 35# 2X15 bilat    Total Gym Leg Press 50# SL X 20 ea      Knee/Hip Exercises: Standing   Other Standing Knee Exercises mini lunges with one UE support 2X10 bilat    Other Standing Knee Exercises lateral walk and monster walk with green band holding mini squat      Knee/Hip Exercises: Seated   Other Seated Knee/Hip Exercises seated SLR on Rt 2X10  PT Long Term Goals - 05/02/21 1307       PT LONG TERM GOAL #1   Title Pt will be indep with advanced HEP for R knee pain management in prep for DC    Baseline not issued at eval    Time 5    Period Weeks    Status New    Target Date 06/08/21      PT LONG TERM GOAL #2   Title Pt will be able to perform Burn [facility] with </= 2/10 R knee pain    Baseline has not attended x 3 weeks    Time 5    Period Weeks    Status New    Target Date 06/08/21      PT LONG TERM GOAL #3   Title Pt will be able to return to all functional activities without R knee pain    Baseline up to 6/10 R knee pain radiating up to R hip    Time 5    Period Weeks    Status New    Target Date 06/08/21      PT LONG TERM GOAL #4   Title Pt will improve Foto score to 83% function    Baseline 77%    Time 5    Period Weeks    Status New    Target Date 06/08/21      PT LONG TERM GOAL #5   Title Pt will demo improved R hip abdct/ext strength to 5/5 to assist with  return to boot camp    Baseline R hip abdct/ext 4/5, all others 5/5    Time 5    Period Weeks    Status New    Target Date 06/08/21                   Plan - 05/15/21 0841     Clinical Impression Statement She had MRI results indicating Rt knee degenerative meniscal tear posterior medial, with knee OA. She will try conservative PT. Session focused on HEP creation for overall Rt knee/hip strength and she shows good understanding and return demo with this.    Examination-Activity Limitations Squat;Lift;Stand;Other   working out   [employer];Occupation    Stability/Clinical Decision Making Stable/Uncomplicated    Rehab Potential Excellent    PT Frequency 2x / week    PT Duration --   5 weeks   PT Treatment/Interventions Cryotherapy;Ultrasound;Moist Heat;Iontophoresis 4mg /ml Dexamethasone;Electrical Stimulation;Gait training;Stair training;Balance training;Therapeutic exercise;Therapeutic activities;Functional mobility training;Neuromuscular re-education;Patient/family education;Manual techniques;Passive range of motion;Dry needling;Taping    PT Next Visit Plan Issue HEP for quad strengthening if able to perform good technique quad contraction, hip flex/HS stretching; check MRI results. R LE strengthening    PT Home Exercise Plan Access Code: [email or web address]    Consulted and Agree with Plan of Care Patient             Patient will benefit from skilled therapeutic intervention in order to improve the following deficits and impairments:  Decreased activity tolerance, Decreased strength, Impaired flexibility, Postural dysfunction, Pain  Visit Diagnosis: Chronic pain of right knee  Pain in right hip  Muscle weakness (generalized)     Problem List Patient Active Problem List   Diagnosis Date Noted   Right knee pain 10/09/2017   Right foot pain 10/09/2017   Plantar fasciitis of right foot 10/16/2016   Right shoulder pain 10/16/2016    Obesity 10/07/2016   DERMATITIS, OTHER ATOPIC 03/24/2007   Mild persistent asthma  without complication 03/10/2007    Birdie Riddle 05/15/2021, 8:54 AM  [facility] Physical Therapy [address] [place], [place], [address] Phone: [phone]   Fax:  [phone]  Name: Stephanie Long MRN: [id number] Date of Birth: 05-05-1976

## 2021-05-17 ENCOUNTER — Other Ambulatory Visit: Payer: Self-pay

## 2021-05-17 ENCOUNTER — Ambulatory Visit: Payer: No Typology Code available for payment source | Admitting: Physical Therapy

## 2021-05-17 DIAGNOSIS — M6281 Muscle weakness (generalized): Secondary | ICD-10-CM

## 2021-05-17 DIAGNOSIS — M25551 Pain in right hip: Secondary | ICD-10-CM | POA: Diagnosis not present

## 2021-05-17 DIAGNOSIS — G8929 Other chronic pain: Secondary | ICD-10-CM | POA: Diagnosis not present

## 2021-05-17 DIAGNOSIS — M25561 Pain in right knee: Secondary | ICD-10-CM | POA: Diagnosis not present

## 2021-05-17 NOTE — Therapy (Signed)
[facility] Physical Therapy [address] [place], [place], [address] Phone: [phone]   Fax:  [phone]  Physical Therapy Treatment  Patient Details  Name: Stephanie Long MRN: [id number] Date of Birth: 21-Sep-1976 Referring Provider (PT): Dr. Magnus Ivan   Encounter Date: 05/17/2021   PT End of Session - 05/17/21 1355     Visit Number 3    Number of Visits 10    Date for PT Re-Evaluation 06/08/21    PT Start Time 1302    PT Stop Time 1347    PT Time Calculation (min) 45 min    Activity Tolerance Patient tolerated treatment well;No increased pain    Behavior During Therapy [facility] for tasks assessed/performed             Past Medical History:  Diagnosis Date   Allergy    Asthma    GERD (gastroesophageal reflux disease)    Gestational diabetes    Gestational hypertension 05/09/2013   Hx of varicella    PCOS (polycystic ovarian syndrome) 2012   Plantar fasciitis    Symptomatic cholelithiasis     Past Surgical History:  Procedure Laterality Date   CESAREAN SECTION N/A 05/09/2013   Procedure: primary cesarean section with delivery of baby girl at 1800.  Apgars 9/9.;  Surgeon: Lenoard Aden, MD;  Location: [facility];  Service: Obstetrics;  Laterality: N/A;   CHOLECYSTECTOMY N/A 06/08/2018   Procedure: LAPAROSCOPIC CHOLECYSTECTOMY;  Surgeon: Violeta Gelinas, MD;  Location: [facility] OR;  Service: General;  Laterality: N/A;   WISDOM TOOTH EXTRACTION     WISDOM TOOTH EXTRACTION      There were no vitals filed for this visit.   Subjective Assessment - 05/17/21 1328     Subjective R knee pain is not bad at all today, she has been compliant with HEP and has no questions about them. She does express she will go to burn boot camp tonight for leg day workout.    Limitations Sitting;House hold activities;Walking    How long can you sit comfortably? indeterminate amount of time    How long can you stand comfortably? indeterminate amount of time    How long  can you walk comfortably? indeterminate amount of time; works 12 hour shifts    Patient Stated Goals to be able to workout again    Pain Onset 1 to 4 weeks ago                               [facility] Adult PT Treatment/Exercise - 05/17/21 0001       Knee/Hip Exercises: Stretches   Lobbyist Both;3 reps;30 seconds    ITB Stretch Both;3 reps;30 seconds      Knee/Hip Exercises: Aerobic   Nustep 10 min L7, LE only      Knee/Hip Exercises: Machines for Strengthening   Cybex Knee Extension 10# 2X15 bilat    Cybex Knee Flexion 35# 2X15 bilat    Total Gym Leg Press 56# SL 2X15 ea      Knee/Hip Exercises: Standing   Other Standing Knee Exercises deadlift 10# 6 inches from floor X 10 reps      Knee/Hip Exercises: Supine   Single Leg Bridge Right;Left;2 sets   holding 5 sec, 15 reps, then 10 reps             PT Long Term Goals - 05/02/21 1307       PT LONG TERM GOAL #1  Title Pt will be indep with advanced HEP for R knee pain management in prep for DC    Baseline not issued at eval    Time 5    Period Weeks    Status New    Target Date 06/08/21      PT LONG TERM GOAL #2   Title Pt will be able to perform Burn [facility] with </= 2/10 R knee pain    Baseline has not attended x 3 weeks    Time 5    Period Weeks    Status New    Target Date 06/08/21      PT LONG TERM GOAL #3   Title Pt will be able to return to all functional activities without R knee pain    Baseline up to 6/10 R knee pain radiating up to R hip    Time 5    Period Weeks    Status New    Target Date 06/08/21      PT LONG TERM GOAL #4   Title Pt will improve Foto score to 83% function    Baseline 77%    Time 5    Period Weeks    Status New    Target Date 06/08/21      PT LONG TERM GOAL #5   Title Pt will demo improved R hip abdct/ext strength to 5/5 to assist with return to boot camp    Baseline R hip abdct/ext 4/5, all others 5/5    Time 5    Period Weeks    Status New     Target Date 06/08/21                   Plan - 05/17/21 1356     Clinical Impression Statement She was having less overall pain today which is a good early sign her prognosis will be good with conservative care. We will continue to work to improve strength and funciton while decreasing pain and monitoring soreness.    Examination-Activity Limitations Squat;Lift;Stand;Other   working out   [employer];Occupation    Stability/Clinical Decision Making Stable/Uncomplicated    Rehab Potential Excellent    PT Frequency 2x / week    PT Duration --   5 weeks   PT Treatment/Interventions Cryotherapy;Ultrasound;Moist Heat;Iontophoresis 4mg /ml Dexamethasone;Electrical Stimulation;Gait training;Stair training;Balance training;Therapeutic exercise;Therapeutic activities;Functional mobility training;Neuromuscular re-education;Patient/family education;Manual techniques;Passive range of motion;Dry needling;Taping    PT Next Visit Plan overall hip/knee strengthening    PT Home Exercise Plan Access Code: [email or web address]    Consulted and Agree with Plan of Care Patient             Patient will benefit from skilled therapeutic intervention in order to improve the following deficits and impairments:  Decreased activity tolerance, Decreased strength, Impaired flexibility, Postural dysfunction, Pain  Visit Diagnosis: Chronic pain of right knee  Pain in right hip  Muscle weakness (generalized)     Problem List Patient Active Problem List   Diagnosis Date Noted   Right knee pain 10/09/2017   Right foot pain 10/09/2017   Plantar fasciitis of right foot 10/16/2016   Right shoulder pain 10/16/2016   Obesity 10/07/2016   DERMATITIS, OTHER ATOPIC 03/24/2007   Mild persistent asthma without complication 03/10/2007    03/12/2007 05/17/2021, 1:57 PM  [facility] Physical Therapy [address] [place], [place],  [place] Phone: [phone]   Fax:  [phone]  Name: Stephanie Long MRN: Curlene Labrum Date of Birth: 23-Aug-1976

## 2021-05-22 ENCOUNTER — Ambulatory Visit: Payer: No Typology Code available for payment source | Admitting: Physical Therapy

## 2021-05-22 ENCOUNTER — Other Ambulatory Visit: Payer: Self-pay

## 2021-05-22 ENCOUNTER — Encounter: Payer: Self-pay | Admitting: Physical Therapy

## 2021-05-22 DIAGNOSIS — M25561 Pain in right knee: Secondary | ICD-10-CM

## 2021-05-22 DIAGNOSIS — G8929 Other chronic pain: Secondary | ICD-10-CM

## 2021-05-22 DIAGNOSIS — M25551 Pain in right hip: Secondary | ICD-10-CM

## 2021-05-22 DIAGNOSIS — M6281 Muscle weakness (generalized): Secondary | ICD-10-CM

## 2021-05-22 NOTE — Therapy (Signed)
[facility] Physical Therapy [address] [place], [place], [address] Phone: [phone]   Fax:  [phone]  Physical Therapy Treatment  Patient Details  Name: Stephanie Long MRN: [id number] Date of Birth: 1976/05/25 Referring Provider (PT): Dr. Magnus Ivan   Encounter Date: 05/22/2021   PT End of Session - 05/22/21 0808     Visit Number 4    Number of Visits 10    Date for PT Re-Evaluation 06/08/21    PT Start Time 0800    PT Stop Time 0840    PT Time Calculation (min) 40 min    Activity Tolerance Patient tolerated treatment well;No increased pain    Behavior During Therapy [facility] for tasks assessed/performed             Past Medical History:  Diagnosis Date   Allergy    Asthma    GERD (gastroesophageal reflux disease)    Gestational diabetes    Gestational hypertension 05/09/2013   Hx of varicella    PCOS (polycystic ovarian syndrome) 2012   Plantar fasciitis    Symptomatic cholelithiasis     Past Surgical History:  Procedure Laterality Date   CESAREAN SECTION N/A 05/09/2013   Procedure: primary cesarean section with delivery of baby girl at 1800.  Apgars 9/9.;  Surgeon: Lenoard Aden, MD;  Location: [facility];  Service: Obstetrics;  Laterality: N/A;   CHOLECYSTECTOMY N/A 06/08/2018   Procedure: LAPAROSCOPIC CHOLECYSTECTOMY;  Surgeon: Violeta Gelinas, MD;  Location: [facility] OR;  Service: General;  Laterality: N/A;   WISDOM TOOTH EXTRACTION     WISDOM TOOTH EXTRACTION      There were no vitals filed for this visit.   Subjective Assessment - 05/22/21 0807     Subjective Pt arriving to therapy reporting 1/10 pain in her right knee.    Limitations Sitting;House hold activities;Walking    How long can you sit comfortably? indeterminate amount of time    How long can you stand comfortably? indeterminate amount of time    How long can you walk comfortably? indeterminate amount of time; works 12 hour shifts    Patient Stated Goals to be able to  workout again    Currently in Pain? Yes    Pain Score 1     Pain Orientation Right    Pain Descriptors / Indicators Tightness    Pain Type Acute pain    Pain Onset 1 to 4 weeks ago                               [facility] Adult PT Treatment/Exercise - 05/22/21 0001       Knee/Hip Exercises: Stretches   Quad Stretch 2 reps;Right;30 seconds;Both    ITB Stretch Both;3 reps;30 seconds    Piriformis Stretch Both;2 reps;20 seconds    Other Knee/Hip Stretches double knee to chest x 20 seconds x 2      Knee/Hip Exercises: Aerobic   Recumbent Bike L4 x 8 minutes      Knee/Hip Exercises: Machines for Strengthening   Cybex Knee Extension 10# 2X15 bilateral with slow eccentric control with just right LE    Cybex Knee Flexion 35# 2X15 bilat    Total Gym Leg Press 75# 3x10 R LE only, 100# bilateral LE's slow eccentrics      Knee/Hip Exercises: Supine   Single Leg Bridge Right;Left;10 reps   holding 5 sec  PT Long Term Goals - 05/22/21 0851       PT LONG TERM GOAL #1   Title Pt will be indep with advanced HEP for R knee pain management in prep for DC    Status On-going      PT LONG TERM GOAL #2   Title Pt will be able to perform Burn [facility] with </= 2/10 R knee pain    Status On-going      PT LONG TERM GOAL #3   Title Pt will be able to return to all functional activities without R knee pain    Status On-going      PT LONG TERM GOAL #4   Title Pt will improve Foto score to 83% function    Status On-going      PT LONG TERM GOAL #5   Title Pt will demo improved R hip abdct/ext strength to 5/5 to assist with return to boot camp    Status On-going                   Plan - 05/22/21 0809     Clinical Impression Statement Pt tolerating exercises well today. Pt reporting 1/10 pain upon arrival in her right anterior knee. Pt stated she feels the pain is more from arthritis. Pt reporting intermittent compliance with her  HEP due to a very busy schedule. Next visit begin to add more dynamic activities. Continue skilled PT progressing toward LTG's.    Personal Factors and Comorbidities Finances    Examination-Activity Limitations Squat;Lift;Stand;Other    Examination-Participation Restrictions Community Activity;Occupation    Rehab Potential Excellent    PT Frequency 2x / week    PT Duration --   5 weeks   PT Treatment/Interventions Cryotherapy;Ultrasound;Moist Heat;Iontophoresis 4mg /ml Dexamethasone;Electrical Stimulation;Gait training;Stair training;Balance training;Therapeutic exercise;Therapeutic activities;Functional mobility training;Neuromuscular re-education;Patient/family education;Manual techniques;Passive range of motion;Dry needling;Taping    PT Next Visit Plan overall hip/knee strengthening    PT Home Exercise Plan Access Code: [email or web address]    Consulted and Agree with Plan of Care Patient             Patient will benefit from skilled therapeutic intervention in order to improve the following deficits and impairments:  Decreased activity tolerance, Decreased strength, Impaired flexibility, Postural dysfunction, Pain  Visit Diagnosis: Chronic pain of right knee  Pain in right hip  Muscle weakness (generalized)     Problem List Patient Active Problem List   Diagnosis Date Noted   Right knee pain 10/09/2017   Right foot pain 10/09/2017   Plantar fasciitis of right foot 10/16/2016   Right shoulder pain 10/16/2016   Obesity 10/07/2016   DERMATITIS, OTHER ATOPIC 03/24/2007   Mild persistent asthma without complication 03/10/2007    03/12/2007, PT, MPT 05/22/2021, 8:52 AM  [facility] Physical Therapy [address] [place], [place], [place] Phone: [phone]   Fax:  [phone]  Name: Stephanie Long MRN: Curlene Labrum Date of Birth: 1976-08-08

## 2021-05-24 ENCOUNTER — Ambulatory Visit: Payer: No Typology Code available for payment source | Admitting: Physical Therapy

## 2021-05-24 ENCOUNTER — Other Ambulatory Visit: Payer: Self-pay

## 2021-05-24 DIAGNOSIS — M6281 Muscle weakness (generalized): Secondary | ICD-10-CM | POA: Diagnosis not present

## 2021-05-24 DIAGNOSIS — M25551 Pain in right hip: Secondary | ICD-10-CM

## 2021-05-24 DIAGNOSIS — G8929 Other chronic pain: Secondary | ICD-10-CM

## 2021-05-24 DIAGNOSIS — M25561 Pain in right knee: Secondary | ICD-10-CM

## 2021-05-24 NOTE — Therapy (Signed)
[facility] Physical Therapy [address] [place], [place], [address] Phone: [phone]   Fax:  [phone]  Physical Therapy Treatment  Patient Details  Name: Stephanie Long MRN: [id number] Date of Birth: [birth year] Referring Provider (PT): Dr. Magnus Ivan   Encounter Date: 05/24/2021   PT End of Session - 05/24/21 1421     Visit Number 5    Number of Visits 10    Date for PT Re-Evaluation 06/08/21    PT Start Time 1345    PT Stop Time 1430    PT Time Calculation (min) 45 min    Activity Tolerance Patient tolerated treatment well;No increased pain    Behavior During Therapy [facility] for tasks assessed/performed             Past Medical History:  Diagnosis Date   Allergy    Asthma    GERD (gastroesophageal reflux disease)    Gestational diabetes    Gestational hypertension 05/09/2013   Hx of varicella    PCOS (polycystic ovarian syndrome) 2012   Plantar fasciitis    Symptomatic cholelithiasis     Past Surgical History:  Procedure Laterality Date   CESAREAN SECTION N/A 05/09/2013   Procedure: primary cesarean section with delivery of baby girl at 1800.  Apgars 9/9.;  Surgeon: Lenoard Aden, MD;  Location: [facility];  Service: Obstetrics;  Laterality: N/A;   CHOLECYSTECTOMY N/A 06/08/2018   Procedure: LAPAROSCOPIC CHOLECYSTECTOMY;  Surgeon: Violeta Gelinas, MD;  Location: [facility] OR;  Service: General;  Laterality: N/A;   WISDOM TOOTH EXTRACTION     WISDOM TOOTH EXTRACTION      There were no vitals filed for this visit.   Subjective Assessment - 05/24/21 1424     Subjective Pt arriving to therapy reporting 1/10 pain in her right knee still, she wears knee brace in today "just in case"    Limitations Sitting;House hold activities;Walking    How long can you sit comfortably? indeterminate amount of time    How long can you stand comfortably? indeterminate amount of time    How long can you walk comfortably? indeterminate amount of time; works 12  hour shifts    Patient Stated Goals to be able to workout again    Pain Onset 1 to 4 weeks ago              [facility] Adult PT Treatment/Exercise - 05/24/21 0001       Exercises   Exercises Knee/Hip      Knee/Hip Exercises: Stretches   Quad Stretch Right;30 seconds;Both;3 reps    ITB Stretch Both;2 reps;30 seconds      Knee/Hip Exercises: Aerobic   Nustep 8 min L7      Knee/Hip Exercises: Machines for Strengthening   Cybex Knee Extension 10# 2X15 bilateral with slow eccentric control with just right LE    Cybex Knee Flexion 35# 2X15 bilat    Total Gym Leg Press 110# bilat 2X10, then SL 75# 2X10 ea side      Knee/Hip Exercises: Standing   Forward Lunges Limitations lunge walk 15 feet X4    Forward Step Up Limitations 6.5 inch step with alt leg march X 15 bilat    Functional Squat Limitations goblet squat 12# with glute touch to chair X 15    Other Standing Knee Exercises deadlift 12#  from floor X15    Other Standing Knee Exercises Y balance heel taps X 5 ea direction bilat      Knee/Hip Exercises: Seated  Sit to Sand 20 reps   20.5 inch up with both down with one, performed bilat                        PT Long Term Goals - 05/22/21 0851       PT LONG TERM GOAL #1   Title Pt will be indep with advanced HEP for R knee pain management in prep for DC    Status On-going      PT LONG TERM GOAL #2   Title Pt will be able to perform Burn [facility] with </= 2/10 R knee pain    Status On-going      PT LONG TERM GOAL #3   Title Pt will be able to return to all functional activities without R knee pain    Status On-going      PT LONG TERM GOAL #4   Title Pt will improve Foto score to 83% function    Status On-going      PT LONG TERM GOAL #5   Title Pt will demo improved R hip abdct/ext strength to 5/5 to assist with return to boot camp    Status On-going                   Plan - 05/24/21 1425     Clinical Impression Statement She is  progressing well with knee strength and stability, we progressed her dynamic strengthening today with good overall tolerance. I recommended to her she only use the knee brace during her bootcamp gym classes or hiking or any strenuous activity but not to wear brace other times so we can build up stability in her knee through strengthening her muscles.    Personal Factors and Comorbidities Finances    Examination-Activity Limitations Squat;Lift;Stand;Other    Examination-Participation Restrictions Community Activity;Occupation    Rehab Potential Excellent    PT Frequency 2x / week    PT Duration --   5 weeks   PT Treatment/Interventions Cryotherapy;Ultrasound;Moist Heat;Iontophoresis 4mg /ml Dexamethasone;Electrical Stimulation;Gait training;Stair training;Balance training;Therapeutic exercise;Therapeutic activities;Functional mobility training;Neuromuscular re-education;Patient/family education;Manual techniques;Passive range of motion;Dry needling;Taping    PT Next Visit Plan progressing overall hip/knee strengthening as tolerated    PT Home Exercise Plan Access Code: [email or web address]    Consulted and Agree with Plan of Care Patient             Patient will benefit from skilled therapeutic intervention in order to improve the following deficits and impairments:  Decreased activity tolerance, Decreased strength, Impaired flexibility, Postural dysfunction, Pain  Visit Diagnosis: Chronic pain of right knee  Pain in right hip  Muscle weakness (generalized)     Problem List Patient Active Problem List   Diagnosis Date Noted   Right knee pain 10/09/2017   Right foot pain 10/09/2017   Plantar fasciitis of right foot 10/16/2016   Right shoulder pain 10/16/2016   Obesity 10/07/2016   DERMATITIS, OTHER ATOPIC 03/24/2007   Mild persistent asthma without complication 03/10/2007    03/12/2007 05/24/2021, 2:27 PM  [facility] Physical Therapy [address] [place], [place], [place] Phone: [phone]   Fax:  [phone]  Name: Stephanie Long MRN: Curlene Labrum Date of Birth: [birth year]

## 2021-05-25 ENCOUNTER — Encounter: Payer: No Typology Code available for payment source | Admitting: Physical Therapy

## 2021-05-29 ENCOUNTER — Ambulatory Visit: Payer: No Typology Code available for payment source | Admitting: Physical Therapy

## 2021-05-29 ENCOUNTER — Encounter: Payer: Self-pay | Admitting: Physical Therapy

## 2021-05-29 ENCOUNTER — Other Ambulatory Visit: Payer: Self-pay

## 2021-05-29 DIAGNOSIS — M25561 Pain in right knee: Secondary | ICD-10-CM | POA: Diagnosis not present

## 2021-05-29 DIAGNOSIS — G8929 Other chronic pain: Secondary | ICD-10-CM | POA: Diagnosis not present

## 2021-05-29 DIAGNOSIS — M6281 Muscle weakness (generalized): Secondary | ICD-10-CM

## 2021-05-29 DIAGNOSIS — M25551 Pain in right hip: Secondary | ICD-10-CM | POA: Diagnosis not present

## 2021-05-29 NOTE — Therapy (Signed)
[facility] Physical Therapy [address] [place], [place], [address] Phone: [phone]   Fax:  [phone]  Physical Therapy Treatment  Patient Details  Name: Stephanie Long MRN: [id number] Date of Birth: 1976/06/10 Referring Provider (PT): Dr. Magnus Ivan   Encounter Date: 05/29/2021   PT End of Session - 05/29/21 0821     Visit Number 6    Number of Visits 10    Date for PT Re-Evaluation 06/08/21    PT Start Time 0800    PT Stop Time 0840    PT Time Calculation (min) 40 min    Activity Tolerance Patient tolerated treatment well;No increased pain    Behavior During Therapy [facility] for tasks assessed/performed             Past Medical History:  Diagnosis Date   Allergy    Asthma    GERD (gastroesophageal reflux disease)    Gestational diabetes    Gestational hypertension 05/09/2013   Hx of varicella    PCOS (polycystic ovarian syndrome) 2012   Plantar fasciitis    Symptomatic cholelithiasis     Past Surgical History:  Procedure Laterality Date   CESAREAN SECTION N/A 05/09/2013   Procedure: primary cesarean section with delivery of baby girl at 1800.  Apgars 9/9.;  Surgeon: Lenoard Aden, MD;  Location: [facility];  Service: Obstetrics;  Laterality: N/A;   CHOLECYSTECTOMY N/A 06/08/2018   Procedure: LAPAROSCOPIC CHOLECYSTECTOMY;  Surgeon: Violeta Gelinas, MD;  Location: [facility] OR;  Service: General;  Laterality: N/A;   WISDOM TOOTH EXTRACTION     WISDOM TOOTH EXTRACTION      There were no vitals filed for this visit.   Subjective Assessment - 05/29/21 0810     Subjective Pt arriving reporting 1/10 pain in right knee. Pt stating that she has worked 3 12 hour shifts and hasn't been able to do her HEP consistently.    Limitations Sitting;House hold activities;Walking    How long can you sit comfortably? indeterminate amount of time    How long can you walk comfortably? indeterminate amount of time; works 12 hour shifts    Patient Stated Goals to  be able to workout again    Currently in Pain? Yes    Pain Score 1     Pain Location Knee    Pain Orientation Right    Pain Descriptors / Indicators Sore    Pain Type Acute pain                               [facility] Adult PT Treatment/Exercise - 05/29/21 0001       Knee/Hip Exercises: Machines for Strengthening   Cybex Knee Extension 15# 2X15 bilateral with slow eccentric control with just right LE    Cybex Knee Flexion 35# 2X15 bilat    Total Gym Leg Press 112# bilateral: 3x10, Rt LE only: 75# 3x10      Knee/Hip Exercises: Standing   Other Standing Knee Exercises side stepping with green theraband 4x 30 feet    Other Standing Knee Exercises vectors using sliding disc toward 3 cones                         PT Long Term Goals - 05/29/21 0836       PT LONG TERM GOAL #1   Title Pt will be indep with advanced HEP for R knee pain management in prep for DC  Status On-going      PT LONG TERM GOAL #2   Title Pt will be able to perform Burn [facility] with </= 2/10 R knee pain    Baseline Pt reporting 4/10 pain when going to boot camp    Status On-going      PT LONG TERM GOAL #3   Title Pt will be able to return to all functional activities without R knee pain    Baseline pain varies    Status On-going      PT LONG TERM GOAL #4   Title Pt will improve Foto score to 83% function    Status On-going      PT LONG TERM GOAL #5   Title Pt will demo improved R hip abdct/ext strength to 5/5 to assist with return to boot camp.    Status On-going                   Plan - 05/29/21 6553     Clinical Impression Statement Pt tolerating exercises well today with no reports of increased pain during session. Pt reporting she is still wearing her knee brace during recreational activities and at her boot camp gym classes.Pt reporting fatigue toward end of session with quad "burn" noted.  Continue skilled PT to maximize function.     Examination-Activity Limitations Squat;Lift;Stand;Other    Examination-Participation Restrictions Community Activity;Occupation    Stability/Clinical Decision Making Stable/Uncomplicated    Rehab Potential Excellent    PT Frequency 2x / week    PT Treatment/Interventions Cryotherapy;Ultrasound;Moist Heat;Iontophoresis 4mg /ml Dexamethasone;Electrical Stimulation;Gait training;Stair training;Balance training;Therapeutic exercise;Therapeutic activities;Functional mobility training;Neuromuscular re-education;Patient/family education;Manual techniques;Passive range of motion;Dry needling;Taping    PT Next Visit Plan BOSU ball work, progressing overall hip/knee strengthening as tolerated    PT Home Exercise Plan Access Code: [email or web address]    Consulted and Agree with Plan of Care Patient             Patient will benefit from skilled therapeutic intervention in order to improve the following deficits and impairments:  Decreased activity tolerance, Decreased strength, Impaired flexibility, Postural dysfunction, Pain  Visit Diagnosis: Chronic pain of right knee  Pain in right hip  Muscle weakness (generalized)     Problem List Patient Active Problem List   Diagnosis Date Noted   Right knee pain 10/09/2017   Right foot pain 10/09/2017   Plantar fasciitis of right foot 10/16/2016   Right shoulder pain 10/16/2016   Obesity 10/07/2016   DERMATITIS, OTHER ATOPIC 03/24/2007   Mild persistent asthma without complication 03/10/2007    03/12/2007, PT, MPT 05/29/2021, 8:46 AM  [facility] Physical Therapy [address] [place], [place], [place] Phone: [phone]   Fax:  [phone]  Name: MADILYNNE MULLAN MRN: Curlene Labrum Date of Birth: 29-Jun-1976

## 2021-05-31 ENCOUNTER — Encounter: Payer: No Typology Code available for payment source | Admitting: Physical Therapy

## 2021-06-04 ENCOUNTER — Encounter: Payer: Self-pay | Admitting: Orthopaedic Surgery

## 2021-06-05 ENCOUNTER — Encounter: Payer: No Typology Code available for payment source | Admitting: Physical Therapy

## 2021-06-06 ENCOUNTER — Ambulatory Visit: Payer: No Typology Code available for payment source | Admitting: Orthopaedic Surgery

## 2021-06-06 ENCOUNTER — Encounter: Payer: Self-pay | Admitting: Orthopaedic Surgery

## 2021-06-06 ENCOUNTER — Other Ambulatory Visit: Payer: Self-pay

## 2021-06-06 ENCOUNTER — Ambulatory Visit: Payer: No Typology Code available for payment source | Admitting: Physical Therapy

## 2021-06-06 DIAGNOSIS — M6281 Muscle weakness (generalized): Secondary | ICD-10-CM | POA: Diagnosis not present

## 2021-06-06 DIAGNOSIS — G8929 Other chronic pain: Secondary | ICD-10-CM

## 2021-06-06 DIAGNOSIS — M25551 Pain in right hip: Secondary | ICD-10-CM

## 2021-06-06 DIAGNOSIS — M25561 Pain in right knee: Secondary | ICD-10-CM

## 2021-06-06 DIAGNOSIS — S83241D Other tear of medial meniscus, current injury, right knee, subsequent encounter: Secondary | ICD-10-CM

## 2021-06-06 MED ORDER — LIDOCAINE HCL 1 % IJ SOLN
3.0000 mL | INTRAMUSCULAR | Status: AC | PRN
  Administered 2021-06-06: 3 mL

## 2021-06-06 MED ORDER — METHYLPREDNISOLONE ACETATE 40 MG/ML IJ SUSP
40.0000 mg | INTRAMUSCULAR | Status: AC | PRN
  Administered 2021-06-06: 40 mg via INTRA_ARTICULAR

## 2021-06-06 NOTE — Progress Notes (Signed)
Office Visit Note   Patient: Stephanie Long           Date of Birth: Aug 04, 1976           MRN: [id number] Visit Date: 06/06/2021              Requested by: Pearline Cables, MD [address] [place],  [place] [address] PCP: Pearline Cables, MD   Assessment & Plan: Visit Diagnoses:  1. Acute medial meniscal tear, right, subsequent encounter   2. Chronic pain of right knee     Plan: I did recommend a steroid injection in her right knee today to treat the acute pain and hopefully calm down her symptoms while she is out of town over the next week.  After that she is participating in clinicals.  At this point I have recommended an arthroscopic surgery for her right knee.  I described in detail what the surgery involves from an intraoperative and postoperative standpoint and discussed the risk and benefits of surgery.  I do feel that this is likely the neck step for her given the MRI findings of meniscal tear combined with clinical exam findings of a right knee medial meniscal tear.  All questions and concerns were answered and addressed.  She will call and let us know about timing for considering a right knee arthroscopy and we can get this scheduled.  Follow-Up Instructions: No follow-ups on file.   Orders:  Orders Placed This Encounter  Procedures   Large Joint Inj   No orders of the defined types were placed in this encounter.     Procedures: Large Joint Inj: R knee on 06/06/2021 12:03 PM Indications: diagnostic evaluation and pain Details: 22 G 1.5 in needle, superolateral approach  Arthrogram: No  Medications: 3 mL lidocaine 1 %; 40 mg methylPREDNISolone acetate 40 MG/ML Outcome: tolerated well, no immediate complications Procedure, treatment alternatives, risks and benefits explained, specific risks discussed. Consent was given by the patient. Immediately prior to procedure a time out was called to verify the correct patient, procedure, equipment,  support staff and site/side marked as required. Patient was prepped and draped in the usual sterile fashion.      Clinical Data: No additional findings.   Subjective: Chief Complaint  Patient presents with   Right Knee - Follow-up  The patient is well-known to me.  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  She does have a known and well-documented acute meniscal tear of the posterior horn and root of the medial meniscus.  Her knees do hyperextend.  She is been working out quite extensively and back working out at [employer].  She is again developed acute right knee pain mainly along the medial joint line with some locking and catching.  She is actually heading out of town next week.  She has been going to physical therapy for her knees as well.  HPI  Review of Systems There is currently listed no fever, chills, nausea, vomiting  Objective: Vital Signs: There were no vitals taken for this visit.  Physical Exam She is alert and oriented x3 and in no acute distress Ortho Exam Examination of her right knee shows it does hyperextend.  She has medial joint line tenderness and a positive Murray sign to the medial compartment of the knee.  There is no effusion today and the knee is ligamentously stable. Specialty Comments:  No specialty comments available.  Imaging: No results found.   PMFS History: Patient Active Problem List   Diagnosis Date Noted   Right knee pain 10/09/2017   Right foot pain 10/09/2017   Plantar fasciitis of right foot 10/16/2016   Right shoulder pain 10/16/2016   Obesity 10/07/2016   DERMATITIS, OTHER ATOPIC 03/24/2007   Mild persistent asthma without complication 03/10/2007   Past Medical History:  Diagnosis Date   Allergy    Asthma    GERD (gastroesophageal reflux disease)    Gestational diabetes    Gestational hypertension 05/09/2013   Hx of varicella    PCOS (polycystic ovarian syndrome) 2012   Plantar  fasciitis    Symptomatic cholelithiasis     Family History  Problem Relation Age of Onset   Diabetes Father    Heart disease Father    Hyperlipidemia Father    Heart attack Father    Depression Mother    Stroke Maternal Grandmother    Cancer Neg Hx    Asthma Neg Hx    Early death Neg Hx     Past Surgical History:  Procedure Laterality Date   CESAREAN SECTION N/A 05/09/2013   Procedure: primary cesarean section with delivery of baby girl at 1800.  Apgars 9/9.;  Surgeon: Lenoard Aden, MD;  Location: [facility];  Service: Obstetrics;  Laterality: N/A;   CHOLECYSTECTOMY N/A 06/08/2018   Procedure: LAPAROSCOPIC CHOLECYSTECTOMY;  Surgeon: Violeta Gelinas, MD;  Location: [facility] OR;  Service: General;  Laterality: N/A;   WISDOM TOOTH EXTRACTION     WISDOM TOOTH EXTRACTION     Social History   Occupational History   Not on file  Tobacco Use   Smoking status: Never   Smokeless tobacco: Never  Vaping Use   Vaping Use: Never used  Substance and Sexual Activity   Alcohol use: Yes    Alcohol/week: 2.0 standard drinks    Types: 1 Glasses of wine, 1 Standard drinks or equivalent per week   Drug use: No   Sexual activity: Yes    Partners: Male

## 2021-06-06 NOTE — Therapy (Signed)
[facility] Physical Therapy [address] [place], [place], [address] Phone: [phone]   Fax:  [phone]  Physical Therapy Treatment  Patient Details  Name: Stephanie Long MRN: [id number] Date of Birth: 07-27-1976 Referring Provider (PT): Dr. Magnus Ivan   Encounter Date: 06/06/2021   PT End of Session - 06/06/21 1649     Visit Number 7    Number of Visits 10    Date for PT Re-Evaluation 06/08/21    PT Start Time 0400    PT Stop Time 0442    PT Time Calculation (min) 42 min    Activity Tolerance Patient tolerated treatment well;No increased pain    Behavior During Therapy [facility] for tasks assessed/performed             Past Medical History:  Diagnosis Date   Allergy    Asthma    GERD (gastroesophageal reflux disease)    Gestational diabetes    Gestational hypertension 05/09/2013   Hx of varicella    PCOS (polycystic ovarian syndrome) 2012   Plantar fasciitis    Symptomatic cholelithiasis     Past Surgical History:  Procedure Laterality Date   CESAREAN SECTION N/A 05/09/2013   Procedure: primary cesarean section with delivery of baby girl at 1800.  Apgars 9/9.;  Surgeon: Lenoard Aden, MD;  Location: [facility];  Service: Obstetrics;  Laterality: N/A;   CHOLECYSTECTOMY N/A 06/08/2018   Procedure: LAPAROSCOPIC CHOLECYSTECTOMY;  Surgeon: Violeta Gelinas, MD;  Location: [facility] OR;  Service: General;  Laterality: N/A;   WISDOM TOOTH EXTRACTION     WISDOM TOOTH EXTRACTION      There were no vitals filed for this visit.   Subjective Assessment - 06/06/21 1648     Subjective Pt arriving reporting pain is worse, she injured her knee at burn boot camp, had MRI showing meniscal tear. She saw MD who provided her with injection and is recommending knee scope.    Limitations Sitting;House hold activities;Walking    How long can you sit comfortably? indeterminate amount of time    How long can you stand comfortably? indeterminate amount of time    How  long can you walk comfortably? indeterminate amount of time; works 12 hour shifts    Patient Stated Goals to be able to workout again    Pain Onset 1 to 4 weeks ago                               [facility] Adult PT Treatment/Exercise - 06/06/21 0001       Knee/Hip Exercises: Aerobic   Recumbent Bike 10 minutes L4      Knee/Hip Exercises: Machines for Strengthening   Cybex Knee Extension 5# Rt leg 3X10    Cybex Knee Flexion 25# Rt leg more than Lt 3X10    Total Gym Leg Press 112# bilateral: 3x10, Rt LE only: 62# 3x10      Knee/Hip Exercises: Standing   Other Standing Knee Exercises TRX squats 2X10, TRX SL RDL on Rt X 10 reps                         PT Long Term Goals - 05/29/21 2993       PT LONG TERM GOAL #1   Title Pt will be indep with advanced HEP for R knee pain management in prep for DC    Status On-going      PT LONG  TERM GOAL #2   Title Pt will be able to perform Burn [facility] with </= 2/10 R knee pain    Baseline Pt reporting 4/10 pain when going to boot camp    Status On-going      PT LONG TERM GOAL #3   Title Pt will be able to return to all functional activities without R knee pain    Baseline pain varies    Status On-going      PT LONG TERM GOAL #4   Title Pt will improve Foto score to 83% function    Status On-going      PT LONG TERM GOAL #5   Title Pt will demo improved R hip abdct/ext strength to 5/5 to assist with return to boot camp.    Status On-going                   Plan - 06/06/21 1650     Clinical Impression Statement She now has medial meniscal tear in Rt knee and MD is recommending knee scope. We discussed surgery vs conservate treatment and we will try some conservative PT in the meantime while she makes a decision to procede with surgical intervention or not.    Examination-Activity Limitations Squat;Lift;Stand;Other    Examination-Participation Restrictions Community Activity;Occupation     Stability/Clinical Decision Making Stable/Uncomplicated    Rehab Potential Excellent    PT Frequency 2x / week    PT Treatment/Interventions Cryotherapy;Ultrasound;Moist Heat;Iontophoresis 4mg /ml Dexamethasone;Electrical Stimulation;Gait training;Stair training;Balance training;Therapeutic exercise;Therapeutic activities;Functional mobility training;Neuromuscular re-education;Patient/family education;Manual techniques;Passive range of motion;Dry needling;Taping    PT Next Visit Plan new meniscal tear so back down and keep exercises to her tolerance    PT Home Exercise Plan Access Code: [email or web address]    Consulted and Agree with Plan of Care Patient             Patient will benefit from skilled therapeutic intervention in order to improve the following deficits and impairments:  Decreased activity tolerance, Decreased strength, Impaired flexibility, Postural dysfunction, Pain  Visit Diagnosis: Chronic pain of right knee  Pain in right hip  Muscle weakness (generalized)     Problem List Patient Active Problem List   Diagnosis Date Noted   Right knee pain 10/09/2017   Right foot pain 10/09/2017   Plantar fasciitis of right foot 10/16/2016   Right shoulder pain 10/16/2016   Obesity 10/07/2016   DERMATITIS, OTHER ATOPIC 03/24/2007   Mild persistent asthma without complication 03/10/2007    03/12/2007 06/06/2021, 4:52 PM  [facility] Physical Therapy [address] [place], [place], [place] Phone: [phone]   Fax:  [phone]  Name: Stephanie Long MRN: Curlene Labrum Date of Birth: 04-05-1976

## 2021-06-07 ENCOUNTER — Ambulatory Visit: Payer: No Typology Code available for payment source | Admitting: Physical Therapy

## 2021-06-07 DIAGNOSIS — M6281 Muscle weakness (generalized): Secondary | ICD-10-CM | POA: Diagnosis not present

## 2021-06-07 DIAGNOSIS — M25551 Pain in right hip: Secondary | ICD-10-CM

## 2021-06-07 DIAGNOSIS — G8929 Other chronic pain: Secondary | ICD-10-CM | POA: Diagnosis not present

## 2021-06-07 DIAGNOSIS — M25561 Pain in right knee: Secondary | ICD-10-CM | POA: Diagnosis not present

## 2021-06-07 NOTE — Therapy (Addendum)
[facility] Physical Therapy [address] [place], [place], [address] Phone: [phone]   Fax:  [phone]  Physical Therapy Treatment / Discharge  Patient Details  Name: Stephanie Long MRN: [id number] Date of Birth: 10-20-1976 Referring Provider (PT): Dr. Ninfa Linden   Encounter Date: 06/07/2021   PT End of Session - 06/07/21 0811     Visit Number 8    Number of Visits 10    Date for PT Re-Evaluation 06/08/21    PT Start Time 0804    PT Stop Time 0845    PT Time Calculation (min) 41 min    Activity Tolerance Patient tolerated treatment well;No increased pain    Behavior During Therapy [facility] for tasks assessed/performed             Past Medical History:  Diagnosis Date   Allergy    Asthma    GERD (gastroesophageal reflux disease)    Gestational diabetes    Gestational hypertension 05/09/2013   Hx of varicella    PCOS (polycystic ovarian syndrome) 2012   Plantar fasciitis    Symptomatic cholelithiasis     Past Surgical History:  Procedure Laterality Date   CESAREAN SECTION N/A 05/09/2013   Procedure: primary cesarean section with delivery of baby girl at 20.  Apgars 9/9.;  Surgeon: Lovenia Kim, MD;  Location: [facility];  Service: Obstetrics;  Laterality: N/A;   CHOLECYSTECTOMY N/A 06/08/2018   Procedure: LAPAROSCOPIC CHOLECYSTECTOMY;  Surgeon: Georganna Skeans, MD;  Location: [facility];  Service: General;  Laterality: N/A;   WISDOM TOOTH EXTRACTION     WISDOM TOOTH EXTRACTION      There were no vitals filed for this visit.   Subjective Assessment - 06/07/21 0810     Subjective relays the pain was really bad when she laid down to go to sleep last night, this morning is is not too bad yet, does have some pain with exending her knee with walking.    Limitations Sitting;House hold activities;Walking    How long can you sit comfortably? indeterminate amount of time    How long can you stand comfortably? indeterminate amount of time    How  long can you walk comfortably? indeterminate amount of time; works 12 hour shifts    Patient Stated Goals to be able to workout again    Pain Onset 1 to 4 weeks ago                               [facility] Adult PT Treatment/Exercise - 06/07/21 0001       Knee/Hip Exercises: Stretches   Knee: Self-Stretch to increase Flexion 10 seconds    Knee: Self-Stretch Limitations 2 minutes for seated tailgate stretch      Knee/Hip Exercises: Seated   Long Arc Quad Right;3 sets;10 reps    Long Arc Quad Weight 2 lbs.    Other Seated Knee/Hip Exercises seated SLR Rt 3X10 with 2#      Knee/Hip Exercises: Supine   Short Arc Quad Sets Right;2 sets;10 reps    Short Arc Quad Sets Limitations 2#    Straight Leg Raises Right;3 sets;10 reps    Straight Leg Raises Limitations 2#      Knee/Hip Exercises: Sidelying   Hip ABduction Right;2 sets;10 reps    Hip ABduction Limitations 2    Hip ADduction Right;2 sets;10 reps    Hip ADduction Limitations 2      Knee/Hip Exercises: Prone  Hip Extension Right;2 sets;10 reps    Hip Extension Limitations 2#      Modalities   Modalities Psychologist, educational Location Rt knee    Electrical Stimulation Action IFC    Electrical Stimulation Parameters to tolerance, 12 min    Electrical Stimulation Goals Pain      Vasopneumatic   Number Minutes Vasopneumatic  10 minutes    Vasopnuematic Location  Knee    Vasopneumatic Pressure Medium    Vasopneumatic Temperature  34                         PT Long Term Goals - 05/29/21 0836       PT LONG TERM GOAL #1   Title Pt will be indep with advanced HEP for R knee pain management in prep for DC    Status On-going      PT LONG TERM GOAL #2   Title Pt will be able to perform [place] with </= 2/10 R knee pain    Baseline Pt reporting 4/10 pain when going to boot camp    Status On-going      PT LONG TERM  GOAL #3   Title Pt will be able to return to all functional activities without R knee pain    Baseline pain varies    Status On-going      PT LONG TERM GOAL #4   Title Pt will improve Foto score to 83% function    Status On-going      PT LONG TERM GOAL #5   Title Pt will demo improved R hip abdct/ext strength to 5/5 to assist with return to boot camp.    Status On-going                   Plan - 06/07/21 0835     Clinical Impression Statement Due to increased pain last night we held off from closed chain strengthening and instead performed open chain strengthening for Rt knee and hip followed by vasopneumatic and E stim in efforts to reduce overall pain and inflammation. We will continue to monitor her response to conservative treatment however she may end up needing surgical intervention.    Examination-Activity Limitations Squat;Lift;Stand;Other    Examination-Participation Restrictions Community Activity;Occupation    Stability/Clinical Decision Making Stable/Uncomplicated    Rehab Potential Excellent    PT Frequency 2x / week    PT Treatment/Interventions Cryotherapy;Ultrasound;Moist Heat;Iontophoresis 33m/ml Dexamethasone;Electrical Stimulation;Gait training;Stair training;Balance training;Therapeutic exercise;Therapeutic activities;Functional mobility training;Neuromuscular re-education;Patient/family education;Manual techniques;Passive range of motion;Dry needling;Taping    PT Next Visit Plan new meniscal tear so back down and keep exercises to her tolerance    PT Home Exercise Plan Access Code: [email or web address]    Consulted and Agree with Plan of Care Patient             Patient will benefit from skilled therapeutic intervention in order to improve the following deficits and impairments:  Decreased activity tolerance, Decreased strength, Impaired flexibility, Postural dysfunction, Pain  Visit Diagnosis: Chronic pain of right knee  Pain in right hip  Muscle weakness  (generalized)     Problem List Patient Active Problem List   Diagnosis Date Noted   Right knee pain 10/09/2017   Right foot pain 10/09/2017   Plantar fasciitis of right foot 10/16/2016   Right shoulder pain 10/16/2016   Obesity 10/07/2016   DERMATITIS, OTHER ATOPIC 03/24/2007  Mild persistent asthma without complication 27/67/0110    Silvestre Mesi 06/07/2021, 8:37 AM  PHYSICAL THERAPY DISCHARGE SUMMARY  Visits from Start of Care: 8  Current functional level related to goals / functional outcomes: See note   Remaining deficits: See note   Education / Equipment: HEP   Patient agrees to discharge. Patient goals were partially met. Patient is being discharged due to not returning since the last visit.  Scot Jun, PT, DPT, OCS, ATC 07/25/21  9:13 AM     [facility] Physical Therapy [address] [place], [place], [address] Phone: [phone]   Fax:  [phone]  Name: ERMELINDA ECKERT MRN: [id number] Date of Birth: Mar 11, 1976

## 2021-06-20 ENCOUNTER — Ambulatory Visit: Payer: No Typology Code available for payment source | Admitting: Orthopaedic Surgery

## 2021-06-27 ENCOUNTER — Encounter: Payer: Self-pay | Admitting: Orthopaedic Surgery

## 2021-06-29 NOTE — Telephone Encounter (Signed)
Questions for you

## 2021-07-13 NOTE — Telephone Encounter (Signed)
Patient's letter request needs to be addressed.  Thanks!

## 2021-07-17 ENCOUNTER — Encounter: Payer: Self-pay | Admitting: Family Medicine

## 2021-08-07 MED FILL — Hydrochlorothiazide Cap 12.5 MG: ORAL | 90 days supply | Qty: 90 | Fill #2 | Status: AC

## 2021-08-08 ENCOUNTER — Other Ambulatory Visit (HOSPITAL_COMMUNITY): Payer: Self-pay

## 2021-08-28 ENCOUNTER — Encounter: Payer: Self-pay | Admitting: Family Medicine

## 2021-08-30 ENCOUNTER — Telehealth: Payer: No Typology Code available for payment source | Admitting: Nurse Practitioner

## 2021-08-30 ENCOUNTER — Telehealth: Payer: Self-pay | Admitting: Family Medicine

## 2021-08-30 DIAGNOSIS — R6889 Other general symptoms and signs: Secondary | ICD-10-CM

## 2021-08-30 MED ORDER — OSELTAMIVIR PHOSPHATE 75 MG PO CAPS: 75.0000 mg | ORAL_CAPSULE | Freq: Two times a day (BID) | ORAL | 0 refills | Status: AC

## 2021-08-30 NOTE — Telephone Encounter (Signed)
Patient was advised to go to ER by triage nurse.

## 2021-08-30 NOTE — Progress Notes (Signed)
Virtual Visit Consent   Stephanie Long, you are scheduled for a virtual visit with a [facility] provider today.     Just as with appointments in the office, your consent must be obtained to participate.  Your consent will be active for this visit and any virtual visit you may have with one of our providers in the next 365 days.     If you have a MyChart account, a copy of this consent can be sent to you electronically.  All virtual visits are billed to your insurance company just like a traditional visit in the office.    As this is a virtual visit, video technology does not allow for your provider to perform a traditional examination.  This may limit your provider's ability to fully assess your condition.  If your provider identifies any concerns that need to be evaluated in person or the need to arrange testing (such as labs, EKG, etc.), we will make arrangements to do so.     Although advances in technology are sophisticated, we cannot ensure that it will always work on either your end or our end.  If the connection with a video visit is poor, the visit may have to be switched to a telephone visit.  With either a video or telephone visit, we are not always able to ensure that we have a secure connection.     I need to obtain your verbal consent now.   Are you willing to proceed with your visit today?    Stephanie Long has provided verbal consent on 08/30/2021 for a virtual visit (video or telephone).   [name], NP   Date: 08/30/2021 6:11 PM   Virtual Visit via Video Note   I, [name], connected with  Stephanie Long  ([id number], 1976-09-24) on 08/30/21 at  5:30 PM EDT by a video-enabled telemedicine application and verified that I am speaking with the correct person using two identifiers.  Location: Patient: Virtual Visit Location Patient: Home Provider: Virtual Visit Location Provider: Home Office   I discussed the limitations of evaluation and  management by telemedicine and the availability of in person appointments. The patient expressed understanding and agreed to proceed.    History of Present Illness: Stephanie Long is a 45 y.o. who identifies as a female who was assigned female at birth, and is being seen today for Flu like symptoms and HTN.   HPI She was instructed earlier today by the triage nurse to go the ED to be evaluated for possible chest x-ray and neb treatments.  At that time she had complaints of cough, fever and body aches x2 days with worsening cough, headaches and shortness of breath.  At this time she states she does not feel like her symptoms warrant going to the ED or urgent care however she is concerned about her blood pressure with last reading 160s/90s.  She was instructed by her primary care a few days ago to increase her hydrochlorothiazide from 12.5 mg to 25 mg however as of today she has continued to take 12.5 mg.  I have instructed her to take the other 12.5 of hydrochlorothiazide and if her blood pressure continues elevated along with her headaches she will need to be seen urgently.  States home COVID test was negative.  Current symptoms include Low grade fever 100.-100.2, strong headaches, shortness of breath with relief from inhaler and dry cough.  She has been taking Tylenol for her headaches with little  relief.  Unclear if headaches are related to her blood pressure readings or possible flu symptoms   Problems:  Patient Active Problem List   Diagnosis Date Noted   Right knee pain 10/09/2017   Right foot pain 10/09/2017   Plantar fasciitis of right foot 10/16/2016   Right shoulder pain 10/16/2016   Obesity 10/07/2016   DERMATITIS, OTHER ATOPIC 03/24/2007   Mild persistent asthma without complication 03/10/2007    Allergies: No Known Allergies Medications:  Current Outpatient Medications:    oseltamivir (TAMIFLU) 75 MG capsule, Take 1 capsule (75 mg total) by mouth 2 (two) times daily for 5  days., Disp: 10 capsule, Rfl: 0   Acetaminophen (TYLENOL PO), Take 1 tablet by mouth as needed., Disp: , Rfl:    calcium carbonate (TUMS - DOSED IN MG ELEMENTAL CALCIUM) 500 MG chewable tablet, Chew 1 tablet by mouth daily as needed for indigestion or heartburn.  (Patient not taking: Reported on 05/02/2021), Disp: , Rfl:    cetirizine (ZYRTEC) 10 MG tablet, Take 10 mg by mouth daily as needed. , Disp: , Rfl:    clobetasol ointment (TEMOVATE) 0.05 %, APPLY TO THE AFFECTED AREA(S) 2 TIMES DAILY AS NEEDED FOR ECZEMA ON HANDS (Patient not taking: Reported on 05/02/2021), Disp: 45 g, Rfl: 0   diclofenac (VOLTAREN) 50 MG EC tablet, Take 1 tablet by mouth twice a day with food as needed for pain and swelling (Patient not taking: Reported on 05/02/2021), Disp: 60 tablet, Rfl: 1   diclofenac sodium (VOLTAREN) 1 % GEL, Apply 4 g topically 4 (four) times daily. Apply to right knee 4 grams 4 times per day and right thumb 2 grams 4 times per day (Patient not taking: Reported on 05/02/2021), Disp: 50 g, Rfl: 1   Glucosamine 500 MG CAPS, Take 1,000 mg by mouth daily., Disp: , Rfl:    hydrochlorothiazide (MICROZIDE) 12.5 MG capsule, TAKE 1 CAPSULE (12.5 MG TOTAL) BY MOUTH DAILY., Disp: 90 capsule, Rfl: 3   IBUPROFEN PO, Take 1 tablet by mouth as needed., Disp: , Rfl:    Melatonin 5 MG TABS, Take by mouth., Disp: , Rfl:    Multiple Vitamin (MULTIVITAMIN) tablet, Take 1 tablet by mouth daily., Disp: , Rfl:    omeprazole (PRILOSEC) 40 MG capsule, TAKE 1 CAPSULE BY MOUTH ONCE A DAY (Patient not taking: Reported on 05/02/2021), Disp: 30 capsule, Rfl: 3   predniSONE (STERAPRED UNI-PAK 21 TAB) 10 MG (21) TBPK tablet, Use as directed on package (6-5-4-3-2-1) (Patient not taking: Reported on 05/02/2021), Disp: 21 tablet, Rfl: 0   triamcinolone ointment (KENALOG) 0.5 %, Apply 1 application topically 2 (two) times daily. Use as needed for hand eczema (Patient not taking: Reported on 05/02/2021), Disp: 30 g, Rfl: 1   VENTOLIN HFA 108 (90  Base) MCG/ACT inhaler, INHALE 2 PUFFS INTO THE LUNGS EVERY 6 HOURS AS NEEDED FOR WHEEZING OR SHORTNESS OF BREATH., Disp: 18 g, Rfl: 6   VITAMIN D PO, Take by mouth., Disp: , Rfl:   Observations/Objective: Patient is well-developed, well-nourished in no acute distress.  Resting comfortably at home.  Head is normocephalic, atraumatic.  No labored breathing.  Speech is clear and coherent with logical content.  Patient is alert and oriented at baseline.    Assessment and Plan: 1. Flu-like symptoms - oseltamivir (TAMIFLU) 75 MG capsule; Take 1 capsule (75 mg total) by mouth 2 (two) times daily for 5 days.  Dispense: 10 capsule; Refill: 0 Instructed to go to the nearest emergency room or urgent  care if headaches worsen to 10 out of 10, blood pressure increases despite taking 12.5 mg additional of hydrochlorothiazide or current symptoms worsen  Follow Up Instructions: I discussed the assessment and treatment plan with the patient. The patient was provided an opportunity to ask questions and all were answered. The patient agreed with the plan and demonstrated an understanding of the instructions.  A copy of instructions were sent to the patient via MyChart unless otherwise noted below.     The patient was advised to call back or seek an in-person evaluation if the symptoms worsen or if the condition fails to improve as anticipated.  Time:  I spent 10 minutes with the patient via telehealth technology discussing the above problems/concerns.    [name], NP

## 2021-08-30 NOTE — Patient Instructions (Signed)
Stephanie Long, thank you for joining [name], NP for today's virtual visit.  While this provider is not your primary care provider (PCP), if your PCP is located in our provider database this encounter information will be shared with them immediately following your visit.  Consent: (Patient) Stephanie Long provided verbal consent for this virtual visit at the beginning of the encounter.  Current Medications:  Current Outpatient Medications:    oseltamivir (TAMIFLU) 75 MG capsule, Take 1 capsule (75 mg total) by mouth 2 (two) times daily for 5 days., Disp: 10 capsule, Rfl: 0   Acetaminophen (TYLENOL PO), Take 1 tablet by mouth as needed., Disp: , Rfl:    calcium carbonate (TUMS - DOSED IN MG ELEMENTAL CALCIUM) 500 MG chewable tablet, Chew 1 tablet by mouth daily as needed for indigestion or heartburn.  (Patient not taking: Reported on 05/02/2021), Disp: , Rfl:    cetirizine (ZYRTEC) 10 MG tablet, Take 10 mg by mouth daily as needed. , Disp: , Rfl:    clobetasol ointment (TEMOVATE) 0.05 %, APPLY TO THE AFFECTED AREA(S) 2 TIMES DAILY AS NEEDED FOR ECZEMA ON HANDS (Patient not taking: Reported on 05/02/2021), Disp: 45 g, Rfl: 0   diclofenac (VOLTAREN) 50 MG EC tablet, Take 1 tablet by mouth twice a day with food as needed for pain and swelling (Patient not taking: Reported on 05/02/2021), Disp: 60 tablet, Rfl: 1   diclofenac sodium (VOLTAREN) 1 % GEL, Apply 4 g topically 4 (four) times daily. Apply to right knee 4 grams 4 times per day and right thumb 2 grams 4 times per day (Patient not taking: Reported on 05/02/2021), Disp: 50 g, Rfl: 1   Glucosamine 500 MG CAPS, Take 1,000 mg by mouth daily., Disp: , Rfl:    hydrochlorothiazide (MICROZIDE) 12.5 MG capsule, TAKE 1 CAPSULE (12.5 MG TOTAL) BY MOUTH DAILY., Disp: 90 capsule, Rfl: 3   IBUPROFEN PO, Take 1 tablet by mouth as needed., Disp: , Rfl:    Melatonin 5 MG TABS, Take by mouth., Disp: , Rfl:    Multiple Vitamin (MULTIVITAMIN)  tablet, Take 1 tablet by mouth daily., Disp: , Rfl:    omeprazole (PRILOSEC) 40 MG capsule, TAKE 1 CAPSULE BY MOUTH ONCE A DAY (Patient not taking: Reported on 05/02/2021), Disp: 30 capsule, Rfl: 3   predniSONE (STERAPRED UNI-PAK 21 TAB) 10 MG (21) TBPK tablet, Use as directed on package (6-5-4-3-2-1) (Patient not taking: Reported on 05/02/2021), Disp: 21 tablet, Rfl: 0   triamcinolone ointment (KENALOG) 0.5 %, Apply 1 application topically 2 (two) times daily. Use as needed for hand eczema (Patient not taking: Reported on 05/02/2021), Disp: 30 g, Rfl: 1   VENTOLIN HFA 108 (90 Base) MCG/ACT inhaler, INHALE 2 PUFFS INTO THE LUNGS EVERY 6 HOURS AS NEEDED FOR WHEEZING OR SHORTNESS OF BREATH., Disp: 18 g, Rfl: 6   VITAMIN D PO, Take by mouth., Disp: , Rfl:    Medications ordered in this encounter:  Meds ordered this encounter  Medications   oseltamivir (TAMIFLU) 75 MG capsule    Sig: Take 1 capsule (75 mg total) by mouth 2 (two) times daily for 5 days.    Dispense:  10 capsule    Refill:  0    Order Specific Question:   Supervising Provider    Answer:   Hyacinth Meeker, BRIAN [[id number]]     *If you need refills on other medications prior to your next appointment, please contact your pharmacy*  Follow-Up: Call back or seek an in-person  evaluation if the symptoms worsen or if the condition fails to improve as anticipated.  Other Instructions Go to the nearest emergency room or urgent care if headaches worsen to 10 out of 10, blood pressure increases despite taking 12.5 mg additional of hydrochlorothiazide or current symptoms worsen    If you have been instructed to have an in-person evaluation today at a local Urgent Care facility, please use the link below. It will take you to a list of all of our available [facility], including address, phone number and hours of operation. Please do not delay care.  [facility]  If you or a family member do not have a primary care provider, use the  link below to schedule a visit and establish care. When you choose a [facility] primary care physician or advanced practice provider, you gain a long-term partner in health. Find a Primary Care Provider  Learn more about [facility] in-office and virtual care options: [facility] - Get Care Now

## 2021-08-30 NOTE — Telephone Encounter (Signed)
Pt. Called and stated she has been feeling bad the last few days.She tested neg for covid. It has now gotten to the point where she has a fever, severe headache and shortness of breathe. Transferred to triage for shortness of breathe and severe headache

## 2021-08-30 NOTE — Telephone Encounter (Signed)
Who Is Calling Patient / Member / Family / Caregiver Call Type Triage / Clinical Relationship To Patient Self Return Phone Number [phone] (Primary) Chief Complaint BREATHING - shortness of breath or sounds breathless Reason for Call Symptomatic / Request for Health Information Initial Comment Caller states, still sick, and needing advice. Pt has fever, cough, and, headache. Pt has sob with exertion or coughing fits. Translation No Nurse Assessment Nurse: Freida Busman, RN, Jonette Eva Date/Time (Eastern Time): 08/30/2021 10:17:12 AM Confirm and document reason for call. If symptomatic, describe symptoms. ---Caller states she started having cough, fever and body aches x 2 days ago It has gotten worse with nagging cough, headache and shortness of breath with the coughing running a fever she says she is short of breaht with stair climbing has had to use inhaler a couple of time for exercise induced asthma using otc meds cough syrup and tylenol  08/30/2021 10:24:34 AM Go to ED Now (or PCP triage) Yes Freida Busman, RN, Jonette Eva

## 2021-09-07 ENCOUNTER — Encounter: Payer: Self-pay | Admitting: Orthopaedic Surgery

## 2021-09-11 ENCOUNTER — Other Ambulatory Visit: Payer: Self-pay

## 2021-09-25 NOTE — Patient Instructions (Addendum)
Good to see you again today- I will be in touch with your labs asap!   Ok to continue hctz 25 and add amlodipine 2.5 mg daily for your BP For asthma try adding Flovent 1 puff twice a day- use albuterol also as needed Tetanus given Recommend newest covid booster if not done yet

## 2021-09-25 NOTE — Progress Notes (Addendum)
[facility] [address] [place], [place] [address] [phone]  Date:  09/27/2021   Name:  Stephanie Long   DOB:  Jul 10, 1976   MRN:  [id number]  PCP:  Pearline Cables, MD    Chief Complaint: Annual Exam (Here for follow up./PT see GYN ) and Hypertension (Complains about Medications )   History of Present Illness:  Stephanie Long is a 45 y.o. very pleasant female patient who presents with the following:  Patient seen today for physical History of allergies and mild asthma, Planter fasciitis, overweight, elevated blood pressure taking HCTZ Most recent visit with myself about 1 year ago At time she was exercising more and lost about 25 pounds  She does plan to have right knee surgery in mid December for a meniscal tear- this will be done on 12/15 per Dr Rayburn Ma  Otherwise she is doing generally ok She has gained some of her weight back because she cannot exercise as much due to her knee.  She is also under more stress and feels somewhat depressed I gave her some prozac but she did not take it due to concerns of sexual side effects She may have rare anxiety attacks She has noted these sx for several weeks, but notes they do not occur that frequently.  At this time she does not want to start any other medication  Most recent labs August 2021 Tetanus-due for update, give today Pap- per GYN Covid booster Mammo- per GYN Flu shot- done   She is now taking hctz 25 mg  BP Readings from Last 3 Encounters:  09/27/21 140/70  09/25/20 118/72  06/14/20 111/72    Patient Active Problem List   Diagnosis Date Noted   Right knee pain 10/09/2017   Right foot pain 10/09/2017   Plantar fasciitis of right foot 10/16/2016   Right shoulder pain 10/16/2016   Obesity 10/07/2016   DERMATITIS, OTHER ATOPIC 03/24/2007   Mild persistent asthma without complication 03/10/2007    Past Medical History:  Diagnosis  Date   Allergy    Asthma    GERD (gastroesophageal reflux disease)    Gestational diabetes    Gestational hypertension 05/09/2013   Hx of varicella    PCOS (polycystic ovarian syndrome) 2012   Plantar fasciitis    Symptomatic cholelithiasis     Past Surgical History:  Procedure Laterality Date   CESAREAN SECTION N/A 05/09/2013   Procedure: primary cesarean section with delivery of baby girl at 1800.  Apgars 9/9.;  Surgeon: Lenoard Aden, MD;  Location: [facility];  Service: Obstetrics;  Laterality: N/A;   CHOLECYSTECTOMY N/A 06/08/2018   Procedure: LAPAROSCOPIC CHOLECYSTECTOMY;  Surgeon: Violeta Gelinas, MD;  Location: [facility] OR;  Service: General;  Laterality: N/A;   WISDOM TOOTH EXTRACTION     WISDOM TOOTH EXTRACTION      Social History   Tobacco Use   Smoking status: Never   Smokeless tobacco: Never  Vaping Use   Vaping Use: Never used  Substance Use Topics   Alcohol use: Yes    Alcohol/week: 2.0 standard drinks    Types: 1 Glasses of wine, 1 Standard drinks or equivalent per week   Drug use: No    Family History  Problem Relation Age of Onset   Diabetes Father    Heart disease Father    Hyperlipidemia Father    Heart attack Father    Depression Mother    Stroke  Maternal Grandmother    Cancer Neg Hx    Asthma Neg Hx    Early death Neg Hx     No Known Allergies  Medication list has been reviewed and updated.  Current Outpatient Medications on File Prior to Visit  Medication Sig Dispense Refill   Acetaminophen (TYLENOL PO) Take 1 tablet by mouth as needed.     calcium carbonate (TUMS - DOSED IN MG ELEMENTAL CALCIUM) 500 MG chewable tablet Chew 1 tablet by mouth daily as needed for indigestion or heartburn.     cetirizine (ZYRTEC) 10 MG tablet Take 10 mg by mouth daily as needed.      diclofenac (VOLTAREN) 50 MG EC tablet Take 1 tablet by mouth twice a day with food as needed for pain and swelling 60 tablet 1   diclofenac sodium (VOLTAREN) 1 % GEL Apply 4 g  topically 4 (four) times daily. Apply to right knee 4 grams 4 times per day and right thumb 2 grams 4 times per day 50 g 1   Glucosamine 500 MG CAPS Take 1,000 mg by mouth daily.     hydrochlorothiazide (MICROZIDE) 12.5 MG capsule TAKE 1 CAPSULE (12.5 MG TOTAL) BY MOUTH DAILY. 90 capsule 3   IBUPROFEN PO Take 1 tablet by mouth as needed.     Melatonin 5 MG TABS Take by mouth.     Multiple Vitamin (MULTIVITAMIN) tablet Take 1 tablet by mouth daily.     omeprazole (PRILOSEC) 40 MG capsule TAKE 1 CAPSULE BY MOUTH ONCE A DAY 30 capsule 3   predniSONE (STERAPRED UNI-PAK 21 TAB) 10 MG (21) TBPK tablet Use as directed on package (6-5-4-3-2-1) 21 tablet 0   triamcinolone ointment (KENALOG) 0.5 % Apply 1 application topically 2 (two) times daily. Use as needed for hand eczema 30 g 1   VENTOLIN HFA 108 (90 Base) MCG/ACT inhaler INHALE 2 PUFFS INTO THE LUNGS EVERY 6 HOURS AS NEEDED FOR WHEEZING OR SHORTNESS OF BREATH. 18 g 6   VITAMIN D PO Take by mouth.     No current facility-administered medications on file prior to visit.    Review of Systems:  As per HPI- otherwise negative.   Physical Examination: Vitals:   09/27/21 1421  BP: 140/70  Pulse: 94  Resp: 16  Temp: 97.8 F (36.6 C)  SpO2: 97%   Vitals:   09/27/21 1421  Weight: 231 lb 9.6 oz (105.1 kg)  Height: 5\' 4"  (1.626 m)   Body mass index is 39.75 kg/m. Ideal Body Weight: Weight in (lb) to have BMI = 25: 145.3  GEN: no acute distress. Obese, looks well  HEENT: Atraumatic, Normocephalic.  Bilateral TM wnl, oropharynx normal.  PEERL,EOMI.   Ears and Nose: No external deformity. CV: RRR, No M/G/R. No JVD. No thrill. No extra heart sounds. PULM: CTA B, no wheezes, crackles, rhonchi. No retractions. No resp. distress. No accessory muscle use. ABD: S, NT, ND, +BS. No rebound. No HSM. EXTR: No c/c/e PSYCH: Normally interactive. Conversant.    Assessment and Plan: Physical exam  Screening for diabetes mellitus - Plan:  Comprehensive metabolic panel, Hemoglobin A1c  Screening for hyperlipidemia - Plan: Lipid panel  Screening for deficiency anemia - Plan: CBC  Screening for thyroid disorder - Plan: TSH  Fatigue, unspecified type - Plan: TSH, VITAMIN D 25 Hydroxy (Vit-D Deficiency, Fractures)  Elevated BP without diagnosis of hypertension  Essential hypertension - Plan: hydrochlorothiazide (HYDRODIURIL) 25 MG tablet, amLODipine (NORVASC) 2.5 MG tablet  Mild persistent asthma without complication -  Plan: fluticasone (FLOVENT HFA) 110 MCG/ACT inhaler  Immunization due - Plan: Tdap vaccine greater than or equal to 7yo IM  Obesity without serious comorbidity, unspecified classification, unspecified obesity type - Plan: Amb Ref to Medical Weight Management  Pt seen today for CPE Encouraged healthy diet and exercise routine Will plan further follow- up pending labs. Referral to weight and wellness center per pt request   Blood pressure is under reasonable control, but patient is concerned would like her numbers to be a bit lower, which is also reasonable.  We will add amlodipine 2.5 mg to her hydrochlorothiazide  She also notes more persistent wheezing during the winter months.  She is using albuterol as needed, we will try adding an inhaled steroid for greater control   Signed Abbe Amsterdam, MD  12/2, received her labs as below.  Message to patient  Results for orders placed or performed in visit on 09/27/21  CBC  Result Value Ref Range   WBC 9.5 4.0 - 10.5 K/uL   RBC 4.64 3.87 - 5.11 Mil/uL   Platelets 232.0 150.0 - 400.0 K/uL   Hemoglobin 13.8 12.0 - 15.0 g/dL   HCT 94.7 07.6 - 15.1 %   MCV 88.6 78.0 - 100.0 fl   MCHC 33.6 30.0 - 36.0 g/dL   RDW 83.4 37.3 - 57.8 %  Comprehensive metabolic panel  Result Value Ref Range   Sodium 139 135 - 145 mEq/L   Potassium 3.6 3.5 - 5.1 mEq/L   Chloride 100 96 - 112 mEq/L   CO2 30 19 - 32 mEq/L   Glucose, Bld 80 70 - 99 mg/dL   BUN 19 6 - 23 mg/dL    Creatinine, Ser 9.78 0.40 - 1.20 mg/dL   Total Bilirubin 0.4 0.2 - 1.2 mg/dL   Alkaline Phosphatase 77 39 - 117 U/L   AST 15 0 - 37 U/L   ALT 14 0 - 35 U/L   Total Protein 6.8 6.0 - 8.3 g/dL   Albumin 4.2 3.5 - 5.2 g/dL   GFR 47.84 >12.82 mL/min   Calcium 9.2 8.4 - 10.5 mg/dL  Hemoglobin K8H  Result Value Ref Range   Hgb A1c MFr Bld 5.3 4.6 - 6.5 %  Lipid panel  Result Value Ref Range   Cholesterol 139 0 - 200 mg/dL   Triglycerides 38.8 0.0 - 149.0 mg/dL   HDL 71.95 >97.47 mg/dL   VLDL 18.5 0.0 - 50.1 mg/dL   LDL Cholesterol 74 0 - 99 mg/dL   Total CHOL/HDL Ratio 3    NonHDL 89.36   TSH  Result Value Ref Range   TSH 2.40 0.35 - 5.50 uIU/mL  VITAMIN D 25 Hydroxy (Vit-D Deficiency, Fractures)  Result Value Ref Range   VITD 39.09 30.00 - 100.00 ng/mL

## 2021-09-27 ENCOUNTER — Other Ambulatory Visit (HOSPITAL_COMMUNITY): Payer: Self-pay

## 2021-09-27 ENCOUNTER — Ambulatory Visit (INDEPENDENT_AMBULATORY_CARE_PROVIDER_SITE_OTHER): Payer: No Typology Code available for payment source | Admitting: Family Medicine

## 2021-09-27 VITALS — BP 140/70 | HR 94 | Temp 97.8°F | Resp 16 | Ht 64.0 in | Wt 231.6 lb

## 2021-09-27 DIAGNOSIS — J4530 Mild persistent asthma, uncomplicated: Secondary | ICD-10-CM

## 2021-09-27 DIAGNOSIS — Z0000 Encounter for general adult medical examination without abnormal findings: Secondary | ICD-10-CM | POA: Diagnosis not present

## 2021-09-27 DIAGNOSIS — Z23 Encounter for immunization: Secondary | ICD-10-CM

## 2021-09-27 DIAGNOSIS — Z1329 Encounter for screening for other suspected endocrine disorder: Secondary | ICD-10-CM

## 2021-09-27 DIAGNOSIS — Z130 Encounter for screening for diseases of the blood and blood-forming organs and certain disorders involving the immune mechanism: Secondary | ICD-10-CM | POA: Diagnosis not present

## 2021-09-27 DIAGNOSIS — Z131 Encounter for screening for diabetes mellitus: Secondary | ICD-10-CM

## 2021-09-27 DIAGNOSIS — Z13220 Encounter for screening for lipoid disorders: Secondary | ICD-10-CM | POA: Diagnosis not present

## 2021-09-27 DIAGNOSIS — E669 Obesity, unspecified: Secondary | ICD-10-CM

## 2021-09-27 DIAGNOSIS — R5383 Other fatigue: Secondary | ICD-10-CM | POA: Diagnosis not present

## 2021-09-27 DIAGNOSIS — I10 Essential (primary) hypertension: Secondary | ICD-10-CM | POA: Insufficient documentation

## 2021-09-27 MED ORDER — AMLODIPINE BESYLATE 2.5 MG PO TABS
2.5000 mg | ORAL_TABLET | Freq: Every day | ORAL | 3 refills | Status: DC
  Filled 2021-09-27: qty 90, 90d supply, fill #0
  Filled 2021-12-28: qty 90, 90d supply, fill #1
  Filled 2022-04-05: qty 90, 90d supply, fill #2
  Filled 2022-06-25: qty 90, 90d supply, fill #3

## 2021-09-27 MED ORDER — FLUTICASONE PROPIONATE HFA 110 MCG/ACT IN AERO
1.0000 | INHALATION_SPRAY | Freq: Two times a day (BID) | RESPIRATORY_TRACT | 12 refills | Status: AC
  Filled 2021-09-27: qty 12, 60d supply, fill #0

## 2021-09-27 MED ORDER — HYDROCHLOROTHIAZIDE 25 MG PO TABS
25.0000 mg | ORAL_TABLET | Freq: Every day | ORAL | 3 refills | Status: DC
  Filled 2021-09-27: qty 90, 90d supply, fill #0
  Filled 2021-12-28: qty 90, 90d supply, fill #1
  Filled 2022-04-05: qty 90, 90d supply, fill #2
  Filled 2022-06-25: qty 90, 90d supply, fill #3

## 2021-09-28 ENCOUNTER — Other Ambulatory Visit (HOSPITAL_COMMUNITY): Payer: Self-pay

## 2021-09-28 ENCOUNTER — Encounter: Payer: Self-pay | Admitting: Family Medicine

## 2021-09-28 LAB — COMPREHENSIVE METABOLIC PANEL
ALT: 14 U/L (ref 0–35)
AST: 15 U/L (ref 0–37)
Albumin: 4.2 g/dL (ref 3.5–5.2)
Alkaline Phosphatase: 77 U/L (ref 39–117)
BUN: 19 mg/dL (ref 6–23)
CO2: 30 mEq/L (ref 19–32)
Calcium: 9.2 mg/dL (ref 8.4–10.5)
Chloride: 100 mEq/L (ref 96–112)
Creatinine, Ser: 0.79 mg/dL (ref 0.40–1.20)
GFR: 91.86 mL/min (ref >60.00)
Glucose, Bld: 80 mg/dL (ref 70–99)
Potassium: 3.6 mEq/L (ref 3.5–5.1)
Sodium: 139 mEq/L (ref 135–145)
Total Bilirubin: 0.4 mg/dL (ref 0.2–1.2)
Total Protein: 6.8 g/dL (ref 6.0–8.3)

## 2021-09-28 LAB — CBC
HCT: 41.1 % (ref 36.0–46.0)
Hemoglobin: 13.8 g/dL (ref 12.0–15.0)
MCHC: 33.6 g/dL (ref 30.0–36.0)
MCV: 88.6 fl (ref 78.0–100.0)
Platelets: 232 10*3/uL (ref 150.0–400.0)
RBC: 4.64 Mil/uL (ref 3.87–5.11)
RDW: 12.9 % (ref 11.5–15.5)
WBC: 9.5 10*3/uL (ref 4.0–10.5)

## 2021-09-28 LAB — LIPID PANEL
Cholesterol: 139 mg/dL (ref 0–200)
HDL: 49.6 mg/dL (ref >39.00)
LDL Cholesterol: 74 mg/dL (ref 0–99)
NonHDL: 89.36
Total CHOL/HDL Ratio: 3
Triglycerides: 76 mg/dL (ref 0.0–149.0)
VLDL: 15.2 mg/dL (ref 0.0–40.0)

## 2021-09-28 LAB — HEMOGLOBIN A1C: Hgb A1c MFr Bld: 5.3 % (ref 4.6–6.5)

## 2021-09-28 LAB — TSH: TSH: 2.4 u[IU]/mL (ref 0.35–5.50)

## 2021-09-28 LAB — VITAMIN D 25 HYDROXY (VIT D DEFICIENCY, FRACTURES): VITD: 39.09 ng/mL (ref 30.00–100.00)

## 2021-10-01 ENCOUNTER — Other Ambulatory Visit (HOSPITAL_COMMUNITY): Payer: Self-pay

## 2021-10-04 ENCOUNTER — Other Ambulatory Visit: Payer: Self-pay | Admitting: Physician Assistant

## 2021-10-04 ENCOUNTER — Encounter (HOSPITAL_BASED_OUTPATIENT_CLINIC_OR_DEPARTMENT_OTHER): Payer: Self-pay | Admitting: Orthopaedic Surgery

## 2021-10-04 ENCOUNTER — Other Ambulatory Visit: Payer: Self-pay

## 2021-10-05 ENCOUNTER — Telehealth: Payer: Self-pay | Admitting: Orthopaedic Surgery

## 2021-10-05 NOTE — Telephone Encounter (Signed)
[employer] forms received. To [employer]. 

## 2021-10-10 ENCOUNTER — Encounter (HOSPITAL_BASED_OUTPATIENT_CLINIC_OR_DEPARTMENT_OTHER)
Admission: RE | Admit: 2021-10-10 | Discharge: 2021-10-10 | Disposition: A | Discharge status: Home / Self Care | Payer: No Typology Code available for payment source | Source: Ambulatory Visit | Attending: Surgery | Admitting: Surgery

## 2021-10-10 DIAGNOSIS — X58XXXA Exposure to other specified factors, initial encounter: Secondary | ICD-10-CM | POA: Diagnosis not present

## 2021-10-10 DIAGNOSIS — I10 Essential (primary) hypertension: Secondary | ICD-10-CM | POA: Diagnosis not present

## 2021-10-10 DIAGNOSIS — Z01812 Encounter for preprocedural laboratory examination: Secondary | ICD-10-CM | POA: Diagnosis present

## 2021-10-10 DIAGNOSIS — K219 Gastro-esophageal reflux disease without esophagitis: Secondary | ICD-10-CM | POA: Diagnosis not present

## 2021-10-10 DIAGNOSIS — J45909 Unspecified asthma, uncomplicated: Secondary | ICD-10-CM | POA: Diagnosis not present

## 2021-10-10 DIAGNOSIS — S83231A Complex tear of medial meniscus, current injury, right knee, initial encounter: Secondary | ICD-10-CM | POA: Diagnosis present

## 2021-10-10 LAB — POCT PREGNANCY, URINE: Preg Test, Ur: NEGATIVE

## 2021-10-10 NOTE — Progress Notes (Signed)
Sent text reminding to come in for a lab work.

## 2021-10-11 ENCOUNTER — Ambulatory Visit (HOSPITAL_BASED_OUTPATIENT_CLINIC_OR_DEPARTMENT_OTHER): Payer: No Typology Code available for payment source | Admitting: Certified Registered"

## 2021-10-11 ENCOUNTER — Ambulatory Visit (HOSPITAL_BASED_OUTPATIENT_CLINIC_OR_DEPARTMENT_OTHER)
Admission: RE | Admit: 2021-10-11 | Discharge: 2021-10-11 | Disposition: A | Discharge status: Home / Self Care | Payer: No Typology Code available for payment source | Attending: Orthopaedic Surgery | Admitting: Orthopaedic Surgery

## 2021-10-11 ENCOUNTER — Encounter (HOSPITAL_BASED_OUTPATIENT_CLINIC_OR_DEPARTMENT_OTHER)
Admission: RE | Disposition: A | Discharge status: Home / Self Care | Payer: Self-pay | Source: Home / Self Care | Attending: Orthopaedic Surgery

## 2021-10-11 ENCOUNTER — Telehealth: Payer: Self-pay | Admitting: Orthopaedic Surgery

## 2021-10-11 ENCOUNTER — Encounter (HOSPITAL_BASED_OUTPATIENT_CLINIC_OR_DEPARTMENT_OTHER): Payer: Self-pay | Admitting: Orthopaedic Surgery

## 2021-10-11 ENCOUNTER — Other Ambulatory Visit: Payer: Self-pay

## 2021-10-11 ENCOUNTER — Other Ambulatory Visit (HOSPITAL_COMMUNITY): Payer: Self-pay

## 2021-10-11 DIAGNOSIS — X58XXXA Exposure to other specified factors, initial encounter: Secondary | ICD-10-CM | POA: Insufficient documentation

## 2021-10-11 DIAGNOSIS — I10 Essential (primary) hypertension: Secondary | ICD-10-CM | POA: Insufficient documentation

## 2021-10-11 DIAGNOSIS — S83231D Complex tear of medial meniscus, current injury, right knee, subsequent encounter: Secondary | ICD-10-CM

## 2021-10-11 DIAGNOSIS — S83231A Complex tear of medial meniscus, current injury, right knee, initial encounter: Secondary | ICD-10-CM | POA: Insufficient documentation

## 2021-10-11 DIAGNOSIS — J45909 Unspecified asthma, uncomplicated: Secondary | ICD-10-CM | POA: Insufficient documentation

## 2021-10-11 DIAGNOSIS — K219 Gastro-esophageal reflux disease without esophagitis: Secondary | ICD-10-CM | POA: Insufficient documentation

## 2021-10-11 HISTORY — PX: KNEE ARTHROSCOPY: SHX127

## 2021-10-11 SURGERY — ARTHROSCOPY, KNEE
Anesthesia: General | Site: Knee | Laterality: Right

## 2021-10-11 MED ORDER — DEXAMETHASONE SODIUM PHOSPHATE 10 MG/ML IJ SOLN
INTRAMUSCULAR | Status: AC
  Filled 2021-10-11: qty 1

## 2021-10-11 MED ORDER — KETOROLAC TROMETHAMINE 30 MG/ML IJ SOLN
INTRAMUSCULAR | Status: AC
  Filled 2021-10-11: qty 1

## 2021-10-11 MED ORDER — PROMETHAZINE HCL 25 MG/ML IJ SOLN
6.2500 mg | INTRAMUSCULAR | Status: DC | PRN
  Administered 2021-10-11: 12.5 mg via INTRAVENOUS

## 2021-10-11 MED ORDER — MIDAZOLAM HCL 2 MG/2ML IJ SOLN
INTRAMUSCULAR | Status: AC
  Filled 2021-10-11: qty 2

## 2021-10-11 MED ORDER — HYDROMORPHONE HCL 1 MG/ML IJ SOLN
INTRAMUSCULAR | Status: AC
  Filled 2021-10-11: qty 0.5

## 2021-10-11 MED ORDER — AMISULPRIDE (ANTIEMETIC) 5 MG/2ML IV SOLN
10.0000 mg | Freq: Once | INTRAVENOUS | Status: AC | PRN
  Administered 2021-10-11: 10 mg via INTRAVENOUS

## 2021-10-11 MED ORDER — BUPIVACAINE HCL (PF) 0.5 % IJ SOLN
INTRAMUSCULAR | Status: DC | PRN
  Administered 2021-10-11: 20 mL

## 2021-10-11 MED ORDER — MIDAZOLAM HCL 5 MG/5ML IJ SOLN
INTRAMUSCULAR | Status: DC | PRN
  Administered 2021-10-11: 2 mg via INTRAVENOUS

## 2021-10-11 MED ORDER — DEXAMETHASONE SODIUM PHOSPHATE 4 MG/ML IJ SOLN
INTRAMUSCULAR | Status: DC | PRN
  Administered 2021-10-11: 10 mg via INTRAVENOUS

## 2021-10-11 MED ORDER — KETOROLAC TROMETHAMINE 30 MG/ML IJ SOLN
INTRAMUSCULAR | Status: DC | PRN
  Administered 2021-10-11: 30 mg via INTRAVENOUS

## 2021-10-11 MED ORDER — AMISULPRIDE (ANTIEMETIC) 5 MG/2ML IV SOLN
INTRAVENOUS | Status: AC
  Filled 2021-10-11: qty 2

## 2021-10-11 MED ORDER — LIDOCAINE 2% (20 MG/ML) 5 ML SYRINGE
INTRAMUSCULAR | Status: AC
  Filled 2021-10-11: qty 5

## 2021-10-11 MED ORDER — LIDOCAINE HCL (CARDIAC) PF 100 MG/5ML IV SOSY
PREFILLED_SYRINGE | INTRAVENOUS | Status: DC | PRN
  Administered 2021-10-11: 60 mg via INTRAVENOUS

## 2021-10-11 MED ORDER — SODIUM CHLORIDE 0.9 % IR SOLN
Status: DC | PRN
  Administered 2021-10-11: 3000 mL

## 2021-10-11 MED ORDER — MEPERIDINE HCL 25 MG/ML IJ SOLN: 6.2500 mg | INTRAMUSCULAR | Status: DC | PRN

## 2021-10-11 MED ORDER — HYDROCODONE-ACETAMINOPHEN 5-325 MG PO TABS
1.0000 | ORAL_TABLET | Freq: Four times a day (QID) | ORAL | 0 refills | Status: AC | PRN
  Filled 2021-10-11: qty 30, 4d supply, fill #0

## 2021-10-11 MED ORDER — PROPOFOL 10 MG/ML IV BOLUS
INTRAVENOUS | Status: DC | PRN
  Administered 2021-10-11: 200 mg via INTRAVENOUS

## 2021-10-11 MED ORDER — FENTANYL CITRATE (PF) 100 MCG/2ML IJ SOLN
INTRAMUSCULAR | Status: AC
  Filled 2021-10-11: qty 2

## 2021-10-11 MED ORDER — PROMETHAZINE HCL 25 MG/ML IJ SOLN
INTRAMUSCULAR | Status: AC
  Filled 2021-10-11: qty 1

## 2021-10-11 MED ORDER — PROPOFOL 500 MG/50ML IV EMUL
INTRAVENOUS | Status: AC
  Filled 2021-10-11: qty 50

## 2021-10-11 MED ORDER — HYDROMORPHONE HCL 1 MG/ML IJ SOLN
0.2500 mg | INTRAMUSCULAR | Status: DC | PRN
  Administered 2021-10-11: 0.25 mg via INTRAVENOUS

## 2021-10-11 MED ORDER — OXYCODONE HCL 5 MG/5ML PO SOLN: 5.0000 mg | Freq: Once | ORAL | Status: DC | PRN

## 2021-10-11 MED ORDER — CEFAZOLIN SODIUM-DEXTROSE 2-4 GM/100ML-% IV SOLN
2.0000 g | INTRAVENOUS | Status: AC
  Administered 2021-10-11: 2 g via INTRAVENOUS

## 2021-10-11 MED ORDER — FENTANYL CITRATE (PF) 100 MCG/2ML IJ SOLN
INTRAMUSCULAR | Status: DC | PRN
  Administered 2021-10-11: 100 ug via INTRAVENOUS

## 2021-10-11 MED ORDER — LACTATED RINGERS IV SOLN: INTRAVENOUS | Status: DC

## 2021-10-11 MED ORDER — ONDANSETRON HCL 4 MG/2ML IJ SOLN
INTRAMUSCULAR | Status: DC | PRN
  Administered 2021-10-11: 4 mg via INTRAVENOUS

## 2021-10-11 MED ORDER — OXYCODONE HCL 5 MG PO TABS: 5.0000 mg | ORAL_TABLET | Freq: Once | ORAL | Status: DC | PRN

## 2021-10-11 MED ORDER — ONDANSETRON HCL 4 MG/2ML IJ SOLN
INTRAMUSCULAR | Status: AC
  Filled 2021-10-11: qty 2

## 2021-10-11 SURGICAL SUPPLY — 26 items
BNDG ELASTIC 6X5.8 VLCR STR LF (GAUZE/BANDAGES/DRESSINGS) ×3 IMPLANT
DRAPE ARTHROSCOPY W/POUCH 90 (DRAPES) ×3 IMPLANT
DRAPE U-SHAPE 47X51 STRL (DRAPES) ×3 IMPLANT
DRSG PAD ABDOMINAL 8X10 ST (GAUZE/BANDAGES/DRESSINGS) ×3 IMPLANT
DURAPREP 26ML APPLICATOR (WOUND CARE) ×3 IMPLANT
EXCALIBUR 3.8MM X 13CM (MISCELLANEOUS) ×2 IMPLANT
GAUZE SPONGE 4X4 12PLY STRL (GAUZE/BANDAGES/DRESSINGS) ×3 IMPLANT
GAUZE XEROFORM 1X8 LF (GAUZE/BANDAGES/DRESSINGS) ×3 IMPLANT
GLOVE SRG 8 PF TXTR STRL LF DI (GLOVE) ×2 IMPLANT
GLOVE SURG ORTHO LTX SZ7.5 (GLOVE) ×3 IMPLANT
GLOVE SURG ORTHO LTX SZ8 (GLOVE) ×3 IMPLANT
GLOVE SURG UNDER POLY LF SZ7 (GLOVE) ×2 IMPLANT
GLOVE SURG UNDER POLY LF SZ8 (GLOVE) ×6
GOWN STRL REUS W/ TWL LRG LVL3 (GOWN DISPOSABLE) ×2 IMPLANT
GOWN STRL REUS W/ TWL XL LVL3 (GOWN DISPOSABLE) ×1 IMPLANT
GOWN STRL REUS W/TWL LRG LVL3 (GOWN DISPOSABLE) ×6
GOWN STRL REUS W/TWL XL LVL3 (GOWN DISPOSABLE) ×3
MANIFOLD NEPTUNE II (INSTRUMENTS) ×2 IMPLANT
PACK ARTHROSCOPY DSU (CUSTOM PROCEDURE TRAY) ×3 IMPLANT
PACK BASIN DAY SURGERY FS (CUSTOM PROCEDURE TRAY) ×3 IMPLANT
PADDING CAST COTTON 6X4 STRL (CAST SUPPLIES) ×3 IMPLANT
SUT ETHILON 3 0 PS 1 (SUTURE) ×3 IMPLANT
TOWEL GREEN STERILE FF (TOWEL DISPOSABLE) ×3 IMPLANT
TUBING ARTHROSCOPY IRRIG 16FT (MISCELLANEOUS) ×3 IMPLANT
WATER STERILE IRR 1000ML POUR (IV SOLUTION) ×3 IMPLANT
WRAP KNEE MAXI GEL POST OP (GAUZE/BANDAGES/DRESSINGS) ×3 IMPLANT

## 2021-10-11 NOTE — Op Note (Signed)
NAME: Stephanie Long, MANGELS MEDICAL RECORD NO: [id number] ACCOUNT NO: [id number] DATE OF BIRTH: [birth year] FACILITY: [facility] LOCATION: [facility] PHYSICIAN: Vanita Panda. Magnus Ivan, MD  Operative Report   DATE OF PROCEDURE: 10/11/2021  PREOPERATIVE DIAGNOSIS:  Right knee complex medial meniscal tear.  POSTOPERATIVE DIAGNOSIS:  Right knee complex medial meniscal tear.  FINDINGS: 1.  Right knee posterior horn and mid body medial meniscal tear. 2.  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  PROCEDURE:  Right knee arthroscopy with partial medial meniscectomy.  SURGEON:  Vanita Panda. Magnus Ivan, MD  ASSISTANT:  Rexene Edison, PA-C  ANESTHESIA: 1.  General. 2.  Local with 0.25% plain Marcaine.  BLOOD LOSS:  Minimal.  ANTIBIOTICS:  2 g IV Ancef.  COMPLICATIONS:  None.  INDICATIONS:  The patient is a 45 year old active female who had not had any previous knee issues.  She had been working out, doing high impact cardiac activities, and she felt a pop in her knee.  After failure of conservative treatment including rest,  activity modification, anti-inflammatories, and steroid injection, an MRI was obtained of the right knee. This showed a complex medial meniscal tear.  She is still trying to rehab her knee appropriately and after continued mechanical symptoms and pain  elected to proceed with an arthroscopic intervention, and we agree with this as well given her continued mechanical symptoms and medial joint line tenderness and the impact it is having on her activities as she continues to work out and lose weight.  We  had a long and thorough discussion about the risks and benefits of the surgery in detail, and informed consent was obtained, and the right knee has been marked.   DESCRIPTION OF PROCEDURE:  After informed consent was obtained, appropriate right knee was marked.  She was brought to the operating room and placed supine on the operating table.  General anesthesia was  then obtained.  Her right thigh, knee, leg, ankle,  and calf were prepped and draped with DuraPrep and sterile drapes including a sterile stockinette.  With the bed raised, a lateral leg post[TIME: 01:45] was utilized.  Timeout was called.  She was identified as correct patient, correct right knee.  We  then made an anterolateral arthroscopy portal and inserted the cannula into the knee.  There was only just a mild effusion.  We placed the camera into the knee and went to the medial compartment of her knee.  The cartilage was pretty much intact  fortunately on the medial femoral condyle and medial tibial plateau.  We probed the meniscus, and there was a tear of the meniscus from the posterior horn and mid body.  Fortunately, it was not an extensive tear in terms of deep into the meniscus.  We  were able to use an arthroscopic shaver through an anteromedial portal as well as basket forceps/biters to perform a partial medial meniscectomy to get this back to a stable margin.  We then assessed the ACL and PCL in the intercondylar area and found  that to be intact.  After that, we assessed the lateral compartment of the knee in a figure-of-four position.  The lateral meniscus and cartilage in lateral compartment as well as the popliteus were all intact.  We finally assessed the patellofemoral  joint.  Her knees do hyperextend, and there was significant wear of the cartilage of the trochlear groove, which was grade 3.  Using arthroscopic shaver, we tried to debride this back to a stable margin as well as underneath the kneecap.  We then allowed  fluid lavage of the knee and drained all fluid from the knee.  The portal sites were closed with interrupted nylon suture.  Plain Marcaine was inserted in the portal sites in the knee joint itself. A well-padded sterile dressing was placed.  She was  awakened, extubated, and taken to recovery room in stable condition with all final counts being correct.  No complications  noted.   Postoperatively, we will allow her to weightbear as tolerated with slow increase in her activities.  We will follow up in the office in a week.  We will give her wound care instructions as well.   [facility] D: 10/11/2021 8:06:28 am T: 10/11/2021 9:33:00 am  JOB: [id number]/ [id number]

## 2021-10-11 NOTE — Telephone Encounter (Signed)
[employer] forms received. To [employer]. ?

## 2021-10-11 NOTE — H&P (Signed)
Stephanie Long is an 45 y.o. female.   Chief Complaint: Left knee pain with locking and catching HPI: Patient is an active 45 year old female who has been dealing with left knee pain for many months now.  She has tried and failed conservative treatment.  She actually had a mechanical injury working out with some pivoting and twisting.  Eventually MRI was obtained of the left knee showing a medial meniscal tear.  Conservative treatment was tried with activity modification and quad strengthening as well as anti-inflammatories and using steroid injection.  With continued mechanical symptoms and pain at this point an arthroscopic intervention has been recommended.  Past Medical History:  Diagnosis Date   Allergy    Asthma    GERD (gastroesophageal reflux disease)    Gestational diabetes    Gestational hypertension 05/09/2013   Hx of varicella    PCOS (polycystic ovarian syndrome) 2012   Plantar fasciitis    Symptomatic cholelithiasis     Past Surgical History:  Procedure Laterality Date   CESAREAN SECTION N/A 05/09/2013   Procedure: primary cesarean section with delivery of baby girl at 1800.  Apgars 9/9.;  Surgeon: Lenoard Aden, MD;  Location: [facility];  Service: Obstetrics;  Laterality: N/A;   CHOLECYSTECTOMY N/A 06/08/2018   Procedure: LAPAROSCOPIC CHOLECYSTECTOMY;  Surgeon: Violeta Gelinas, MD;  Location: [facility] OR;  Service: General;  Laterality: N/A;   WISDOM TOOTH EXTRACTION     WISDOM TOOTH EXTRACTION      Family History  Problem Relation Age of Onset   Diabetes Father    Heart disease Father    Hyperlipidemia Father    Heart attack Father    Depression Mother    Stroke Maternal Grandmother    Cancer Neg Hx    Asthma Neg Hx    Early death Neg Hx    Social History:  reports that she has never smoked. She has never used smokeless tobacco. She reports current alcohol use of about 2.0 standard drinks per week. She reports that she does not use drugs.  Allergies: No Known  Allergies  Medications Prior to Admission  Medication Sig Dispense Refill   amLODipine (NORVASC) 2.5 MG tablet Take 1 tablet (2.5 mg total) by mouth daily. 90 tablet 3   fluticasone (FLOVENT HFA) 110 MCG/ACT inhaler Inhale 1 puff into the lungs in the morning and at bedtime. 12 g 12   Glucosamine 500 MG CAPS Take 1,000 mg by mouth daily.     hydrochlorothiazide (HYDRODIURIL) 25 MG tablet Take 1 tablet (25 mg total) by mouth daily. 90 tablet 3   Melatonin 5 MG TABS Take by mouth.     Multiple Vitamin (MULTIVITAMIN) tablet Take 1 tablet by mouth daily.     VITAMIN D PO Take by mouth.     Acetaminophen (TYLENOL PO) Take 1 tablet by mouth as needed.     calcium carbonate (TUMS - DOSED IN MG ELEMENTAL CALCIUM) 500 MG chewable tablet Chew 1 tablet by mouth daily as needed for indigestion or heartburn.     diclofenac (VOLTAREN) 50 MG EC tablet Take 1 tablet by mouth twice a day with food as needed for pain and swelling 60 tablet 1   diclofenac sodium (VOLTAREN) 1 % GEL Apply 4 g topically 4 (four) times daily. Apply to right knee 4 grams 4 times per day and right thumb 2 grams 4 times per day 50 g 1   IBUPROFEN PO Take 1 tablet by mouth as needed.  omeprazole (PRILOSEC) 40 MG capsule TAKE 1 CAPSULE BY MOUTH ONCE A DAY 30 capsule 3   predniSONE (STERAPRED UNI-PAK 21 TAB) 10 MG (21) TBPK tablet Use as directed on package (6-5-4-3-2-1) 21 tablet 0   triamcinolone ointment (KENALOG) 0.5 % Apply 1 application topically 2 (two) times daily. Use as needed for hand eczema 30 g 1   VENTOLIN HFA 108 (90 Base) MCG/ACT inhaler INHALE 2 PUFFS INTO THE LUNGS EVERY 6 HOURS AS NEEDED FOR WHEEZING OR SHORTNESS OF BREATH. 18 g 6    Results for orders placed or performed during the hospital encounter of 10/10/21 (from the past 48 hour(s))  Pregnancy, urine POC     Status: None   Collection Time: 10/10/21 11:20 AM  Result Value Ref Range   Preg Test, Ur NEGATIVE NEGATIVE    Comment:        THE SENSITIVITY OF  THIS METHODOLOGY IS >24 mIU/mL    No results found.  Review of Systems  All other systems reviewed and are negative.  Blood pressure 120/82, pulse 76, temperature 98 F (36.7 C), temperature source Oral, resp. rate 13, height 5\' 4"  (1.626 m), weight 103.4 kg, SpO2 98 %. Physical Exam Vitals reviewed.  Constitutional:      Appearance: Normal appearance.  HENT:     Head: Normocephalic and atraumatic.  Eyes:     Extraocular Movements: Extraocular movements intact.     Pupils: Pupils are equal, round, and reactive to light.  Cardiovascular:     Rate and Rhythm: Normal rate.  Pulmonary:     Effort: Pulmonary effort is normal.  Abdominal:     Palpations: Abdomen is soft.  Musculoskeletal:     Cervical back: Normal range of motion and neck supple.     Right knee: Tenderness present over the medial joint line. Abnormal meniscus.  Neurological:     Mental Status: She is alert and oriented to person, place, and time.  Psychiatric:        Behavior: Behavior normal.     Assessment/Plan Right knee medial meniscal tear  Our plan will be to proceed to surgery today as an outpatient for a right knee arthroscopy and partial medial meniscectomy.  The risks and benefits of surgery been explained in detail and informed consent was obtained.  The right kidney has been marked.  , MD 10/11/2021, 7:18 AM

## 2021-10-11 NOTE — Anesthesia Postprocedure Evaluation (Signed)
Anesthesia Post Note  Patient: Stephanie Long  Procedure(s) Performed: RIGHT KNEE ARTHROSCOPY WITH PARTIAL MEDIAL MENISCECTOMY (Right: Knee)     Patient location during evaluation: PACU Anesthesia Type: General Level of consciousness: awake and alert Pain management: pain level controlled Vital Signs Assessment: post-procedure vital signs reviewed and stable Respiratory status: spontaneous breathing, nonlabored ventilation and respiratory function stable Cardiovascular status: blood pressure returned to baseline and stable Postop Assessment: no apparent nausea or vomiting Anesthetic complications: no   No notable events documented.  Last Vitals:  Vitals:   10/11/21 0911 10/11/21 0923  BP: 117/82 130/76  Pulse: (!) 49 (!) 46  Resp: 11 16  Temp:  (!) 36.3 C  SpO2: 99% 97%    Last Pain:  Vitals:   10/11/21 0923  TempSrc: Oral  PainSc: 4                  Lowella Curb

## 2021-10-11 NOTE — Brief Op Note (Signed)
10/11/2021  8:08 AM  PATIENT:  Curlene Labrum  45 y.o. female  PRE-OPERATIVE DIAGNOSIS:  right knee medial meniscal tear  POST-OPERATIVE DIAGNOSIS:  right knee medial meniscal tear  PROCEDURE:  Procedure(s): RIGHT KNEE ARTHROSCOPY WITH PARTIAL MEDIAL MENISCECTOMY (Right)  SURGEON:  Surgeon(s) and Role:    Kathryne Hitch, MD - Primary  PHYSICIAN ASSISTANT:  Rexene Edison, PA-C  ANESTHESIA:   local and general  EBL:  10 mL   COUNTS:  YES  TOURNIQUET:  * No tourniquets in log *  DICTATION: .Other Dictation: Dictation Number [id number]  PLAN OF CARE: Discharge to home after PACU  PATIENT DISPOSITION:  PACU - hemodynamically stable.   Delay start of Pharmacological VTE agent (>24hrs) due to surgical blood loss or risk of bleeding: no

## 2021-10-11 NOTE — Transfer of Care (Signed)
Immediate Anesthesia Transfer of Care Note  Patient: Stephanie Long  Procedure(s) Performed: RIGHT KNEE ARTHROSCOPY WITH PARTIAL MEDIAL MENISCECTOMY (Right: Knee)  Patient Location: PACU  Anesthesia Type:General  Level of Consciousness: sedated  Airway & Oxygen Therapy: Patient Spontanous Breathing and Patient connected to face mask oxygen  Post-op Assessment: Report given to RN and Post -op Vital signs reviewed and stable  Post vital signs: Reviewed and stable  Last Vitals:  Vitals Value Taken Time  BP    Temp    Pulse 73 10/11/21 0813  Resp 17 10/11/21 0813  SpO2 95 % 10/11/21 0813  Vitals shown include unvalidated device data.  Last Pain:  Vitals:   10/11/21 0656  TempSrc: Oral  PainSc: 1          Complications: No notable events documented.

## 2021-10-11 NOTE — Discharge Instructions (Addendum)
You may put all of your weight on your right knee as comfort allows. Increase her activities as comfort allows. Do expect right knee swelling -ice and elevation periodically for the next few days. Do pump your feet several times a day for circulation. Take an over-the-counter 325 mg aspirin once daily for 1 week. You may remove your dressings in 24 hours and get your incisions wet in the shower. After each shower apply small Band-Aids over the sutures. You may drive in 2 to 3 days.   No ibuprofen until after 2pm today if needed  Post Anesthesia Home Care Instructions  Activity: Get plenty of rest for the remainder of the day. A responsible individual must stay with you for 24 hours following the procedure.  For the next 24 hours, DO NOT: -Drive a car -Advertising copywriter -Drink alcoholic beverages -Take any medication unless instructed by your physician -Make any legal decisions or sign important papers.  Meals: Start with liquid foods such as gelatin or soup. Progress to regular foods as tolerated. Avoid greasy, spicy, heavy foods. If nausea and/or vomiting occur, drink only clear liquids until the nausea and/or vomiting subsides. Call your physician if vomiting continues.  Special Instructions/Symptoms: Your throat may feel dry or sore from the anesthesia or the breathing tube placed in your throat during surgery. If this causes discomfort, gargle with warm salt water. The discomfort should disappear within 24 hours.  If you had a scopolamine patch placed behind your ear for the management of post- operative nausea and/or vomiting:  1. The medication in the patch is effective for 72 hours, after which it should be removed.  Wrap patch in a tissue and discard in the trash. Wash hands thoroughly with soap and water. 2. You may remove the patch earlier than 72 hours if you experience unpleasant side effects which may include dry mouth, dizziness or visual disturbances. 3. Avoid touching  the patch. Wash your hands with soap and water after contact with the patch.

## 2021-10-11 NOTE — Anesthesia Preprocedure Evaluation (Signed)
Anesthesia Evaluation  Patient identified by MRN, date of birth, ID band Patient awake    Reviewed: Allergy & Precautions, NPO status , Patient's Chart, lab work & pertinent test results  History of Anesthesia Complications Negative for: history of anesthetic complications  Airway Mallampati: I  TM Distance: >3 FB Neck ROM: Full    Dental  (+) Teeth Intact   Pulmonary asthma ,    breath sounds clear to auscultation       Cardiovascular hypertension,  Rhythm:Regular Rate:Normal  Stress Test 03/11/18 Blood pressure demonstrated a hypotensive response to exercise. There was no ST segment deviation noted during stress. No ischemic EKG changes noted with exercise.   Neuro/Psych negative neurological ROS  negative psych ROS   GI/Hepatic Neg liver ROS, GERD  Controlled and Medicated,  Endo/Other  negative endocrine ROSdiabetes  Renal/GU negative Renal ROS  negative genitourinary   Musculoskeletal negative musculoskeletal ROS (+)   Abdominal (+) + obese,   Peds negative pediatric ROS (+)  Hematology negative hematology ROS (+)   Anesthesia Other Findings   Reproductive/Obstetrics negative OB ROS                             Anesthesia Physical  Anesthesia Plan  ASA: 3  Anesthesia Plan: General   Post-op Pain Management:    Induction: Intravenous  PONV Risk Score and Plan: 3 and Midazolam, Dexamethasone, Ondansetron and Treatment may vary due to age or medical condition  Airway Management Planned: LMA  Additional Equipment:   Intra-op Plan:   Post-operative Plan: Extubation in OR  Informed Consent: I have reviewed the patients History and Physical, chart, labs and discussed the procedure including the risks, benefits and alternatives for the proposed anesthesia with the patient or authorized representative who has indicated his/her understanding and acceptance.     Dental  advisory given  Plan Discussed with: CRNA  Anesthesia Plan Comments:         Anesthesia Quick Evaluation

## 2021-10-11 NOTE — Anesthesia Procedure Notes (Signed)
Procedure Name: LMA Insertion Date/Time: 10/11/2021 7:42 AM Performed by: Lance Coon, CRNA Pre-anesthesia Checklist: Patient identified, Emergency Drugs available, Suction available and Patient being monitored Patient Re-evaluated:Patient Re-evaluated prior to induction Oxygen Delivery Method: Circle system utilized Preoxygenation: Pre-oxygenation with 100% oxygen Induction Type: IV induction Ventilation: Mask ventilation without difficulty LMA: LMA inserted LMA Size: 4.0 Number of attempts: 1 Airway Equipment and Method: Bite block Placement Confirmation: positive ETCO2 Tube secured with: Tape Dental Injury: Teeth and Oropharynx as per pre-operative assessment

## 2021-10-15 ENCOUNTER — Encounter (HOSPITAL_BASED_OUTPATIENT_CLINIC_OR_DEPARTMENT_OTHER): Payer: Self-pay | Admitting: Orthopaedic Surgery

## 2021-10-18 ENCOUNTER — Other Ambulatory Visit: Payer: Self-pay

## 2021-10-18 ENCOUNTER — Ambulatory Visit (INDEPENDENT_AMBULATORY_CARE_PROVIDER_SITE_OTHER): Payer: No Typology Code available for payment source | Admitting: Physician Assistant

## 2021-10-18 ENCOUNTER — Encounter: Payer: Self-pay | Admitting: Physician Assistant

## 2021-10-18 ENCOUNTER — Telehealth: Payer: Self-pay | Admitting: Physician Assistant

## 2021-10-18 DIAGNOSIS — Z9889 Other specified postprocedural states: Secondary | ICD-10-CM

## 2021-10-18 NOTE — Telephone Encounter (Signed)
Received 2 medical records release forms and letter from patient  $50.00 check/forwarding to [facility] today

## 2021-10-18 NOTE — Progress Notes (Signed)
HPI: Stephanie Long returns today status post right knee arthroscopy 10/11/2021.  She underwent a right knee arthroscopy for a medial meniscal tear.  She was also found to have grade III chondromalacia in the patellofemoral joint.  She is overall feeling the knee feels slightly unstable.  Having some popping some swelling.  No shortness of breath or chest pain.   Physical exam: Right knee she has full extension flexion beyond 90 degrees.  Calf supple nontender.  No significant edema right lower leg.  Port sites are healing well.   Impression status post right knee arthroscopy 10/11/2021  Plan: Recommend she work on gentle range of motion of the knee.  Scar tissue mobilization port sites.  Reviewed findings and the arthroscopy pictures with her today.  We will keep her out of work for the next 2 weeks.  We will see her back in 2 weeks to reevaluate her work status.  Questions were encouraged and answered

## 2021-11-05 ENCOUNTER — Other Ambulatory Visit: Payer: Self-pay

## 2021-11-05 ENCOUNTER — Ambulatory Visit (INDEPENDENT_AMBULATORY_CARE_PROVIDER_SITE_OTHER): Payer: No Typology Code available for payment source | Admitting: Physician Assistant

## 2021-11-05 ENCOUNTER — Encounter: Payer: Self-pay | Admitting: Physician Assistant

## 2021-11-05 DIAGNOSIS — Z9889 Other specified postprocedural states: Secondary | ICD-10-CM

## 2021-11-05 NOTE — Progress Notes (Signed)
HPI: Stephanie Long returns today follow-up of her right knee status post right knee arthroscopy on 10/11/2021.  She states overall the knee is doing well.  She is ready to return to work this Friday without restrictions.  Taking occasional Tylenol and icing the knee.  She is able to put weight on the knee when going up and down stairs now.  She denies any shortness of breath fevers chills or calf pain.  Physical exam: Right knee excellent range of motion.  Slight keloid in the lateral port site.  Otherwise no signs of infection or wound dehiscence.  Calf supple nontender.  Impression status post right knee arthroscopy 10/11/2021  Plan: She will work on quad strengthening and knee friendly exercises as discussed today.  Also work on scar tissue mobilization particularly the lateral port site.  Follow-up in 4 weeks see how she is doing overall.  She is to return to work this Friday without restrictions.  Questions were encouraged and answered

## 2021-12-03 ENCOUNTER — Other Ambulatory Visit: Payer: Self-pay

## 2021-12-03 ENCOUNTER — Encounter: Payer: Self-pay | Admitting: Physician Assistant

## 2021-12-03 ENCOUNTER — Ambulatory Visit (INDEPENDENT_AMBULATORY_CARE_PROVIDER_SITE_OTHER): Payer: No Typology Code available for payment source | Admitting: Physician Assistant

## 2021-12-03 DIAGNOSIS — Z9889 Other specified postprocedural states: Secondary | ICD-10-CM

## 2021-12-03 NOTE — Progress Notes (Signed)
HPI: Stephanie Long returns today for follow-up status post right knee arthroscopy partial medial meniscectomy.  Again she had great 3 changes involving patellofemoral compartment.  She states overall that she is a little sore but overall doing well.  She is back at work.  She notes that if she has knees bent for long period time she has some discomfort.  Ranks her pain to be 1 out of 10 pain.  Physical exam: Right knee full to hyperextension full flexion.  Ambulates without any assistive device.  Impression: Status post right knee arthroscopy 10/11/2021  Plan: Discussed with her need to continue to work on quad strengthening and avoid deep squats lunges for exercise.  Discussed knee friendly exercises with her.  She will follow-up with [place] as needed.  She may benefit from supplemental injections in the future.  Questions were encouraged and answered                                                                                                                                                                                                                                                                                                                +                  ++++++++++++  -

## 2021-12-28 ENCOUNTER — Other Ambulatory Visit (HOSPITAL_COMMUNITY): Payer: Self-pay

## 2022-01-21 ENCOUNTER — Encounter: Payer: Self-pay | Admitting: Family Medicine

## 2022-01-21 ENCOUNTER — Other Ambulatory Visit (HOSPITAL_COMMUNITY): Payer: Self-pay

## 2022-01-21 DIAGNOSIS — L301 Dyshidrosis [pompholyx]: Secondary | ICD-10-CM

## 2022-01-21 MED ORDER — TRIAMCINOLONE ACETONIDE 0.5 % EX OINT
1.0000 "application " | TOPICAL_OINTMENT | Freq: Two times a day (BID) | CUTANEOUS | 2 refills | Status: DC
  Filled 2022-01-21: qty 45, 14d supply, fill #0

## 2022-01-22 ENCOUNTER — Other Ambulatory Visit (HOSPITAL_COMMUNITY): Payer: Self-pay

## 2022-01-23 ENCOUNTER — Other Ambulatory Visit (HOSPITAL_COMMUNITY): Payer: Self-pay

## 2022-01-24 ENCOUNTER — Other Ambulatory Visit (HOSPITAL_COMMUNITY): Payer: Self-pay

## 2022-04-05 ENCOUNTER — Other Ambulatory Visit (HOSPITAL_COMMUNITY): Payer: Self-pay

## 2022-04-24 ENCOUNTER — Encounter: Payer: Self-pay | Admitting: Family Medicine

## 2022-04-24 DIAGNOSIS — L301 Dyshidrosis [pompholyx]: Secondary | ICD-10-CM

## 2022-06-25 ENCOUNTER — Other Ambulatory Visit (HOSPITAL_COMMUNITY): Payer: Self-pay

## 2022-07-25 ENCOUNTER — Other Ambulatory Visit (HOSPITAL_COMMUNITY): Payer: Self-pay

## 2022-07-25 MED ORDER — CLOBETASOL PROPIONATE 0.05 % EX CREA
TOPICAL_CREAM | CUTANEOUS | 1 refills | Status: DC
  Filled 2022-07-25: qty 60, 30d supply, fill #0
  Filled 2023-01-06: qty 60, 30d supply, fill #1

## 2022-08-26 ENCOUNTER — Telehealth: Payer: Self-pay | Admitting: Pharmacist

## 2022-08-26 ENCOUNTER — Other Ambulatory Visit (HOSPITAL_COMMUNITY): Payer: Self-pay

## 2022-08-26 MED ORDER — DUPIXENT 300 MG/2ML ~~LOC~~ SOAJ: SUBCUTANEOUS | 4 refills | Status: DC

## 2022-08-26 NOTE — Telephone Encounter (Signed)
Called patient to schedule an appointment for the [facility]. I was unable to reach the patient so I left a HIPAA-compliant message requesting that the patient return my call.   Luke Van Ausdall, PharmD, BCACP, CPP Clinical Pharmacist [facility] [phone]  

## 2022-08-28 ENCOUNTER — Other Ambulatory Visit (HOSPITAL_COMMUNITY): Payer: Self-pay

## 2022-08-29 ENCOUNTER — Other Ambulatory Visit (HOSPITAL_COMMUNITY): Payer: Self-pay

## 2022-08-29 ENCOUNTER — Ambulatory Visit: Payer: No Typology Code available for payment source | Attending: Family Medicine | Admitting: Pharmacist

## 2022-08-29 DIAGNOSIS — Z7189 Other specified counseling: Secondary | ICD-10-CM

## 2022-08-29 MED ORDER — DUPIXENT 300 MG/2ML ~~LOC~~ SOAJ
SUBCUTANEOUS | 4 refills | Status: DC
  Filled 2022-08-29: qty 4, 14d supply, fill #0
  Filled 2022-09-03: qty 8, 30d supply, fill #0
  Filled 2022-09-27 – 2022-10-09 (×2): qty 4, 28d supply, fill #1
  Filled 2022-11-05: qty 4, 28d supply, fill #2
  Filled 2022-12-04: qty 4, 28d supply, fill #3
  Filled 2023-01-03: qty 4, 28d supply, fill #4
  Filled 2023-02-03: qty 4, 28d supply, fill #5

## 2022-08-29 NOTE — Progress Notes (Signed)
   S: Patient presents for review of their specialty medication therapy.  Patient is about to start taking Dupixent for atopic dermatitis. Patient is managed by Dr. Anabel Bene for this.   Adherence: has not started    Efficacy: has not started   Dosing: 600 mg subq x1 followed by 300 mg subq q2weeks   Dose adjustments: Renal: no dose adjustments (has not been studied) Hepatic: no dose adjustments (has not been studied)  Drug-drug interactions: none  Monitoring: S/sx of infection: none S/sx of hypersensitivity: none S/sx of ocular effects: none S/sx of eosinophilia/vasculitis: none  O:     Lab Results  Component Value Date   WBC 9.5 09/27/2021   HGB 13.8 09/27/2021   HCT 41.1 09/27/2021   MCV 88.6 09/27/2021   PLT 232.0 09/27/2021      Chemistry      Component Value Date/Time   NA 139 09/27/2021 1458   K 3.6 09/27/2021 1458   CL 100 09/27/2021 1458   CO2 30 09/27/2021 1458   BUN 19 09/27/2021 1458   CREATININE 0.79 09/27/2021 1458      Component Value Date/Time   CALCIUM 9.2 09/27/2021 1458   ALKPHOS 77 09/27/2021 1458   AST 15 09/27/2021 1458   ALT 14 09/27/2021 1458   BILITOT 0.4 09/27/2021 1458       A/P: 1. Medication review: Patient is about to start on Santel for atopic dermatitis. Reviewed the medication with the patient, including the following: Dupixent is a monoclonal antibody used for the treatment of asthma or atopic dermatitis. Patient educated on purpose, proper use and potential adverse effects of Dupixent. Possible adverse effects include increased risk of infection, ocular effects, vasculitis/eosinophilia, and hypersensitivity reactions. Administer as a SubQ injection and rotate sites. Allow the medication to reach room temp prior to administration (45 mins for 300 mg syringe or 30 min for 200 mg syringe). Do not shake. Discard any unused portion. No recommendations for any changes.  Benard Halsted, PharmD, [place], [place] [phone]

## 2022-08-30 ENCOUNTER — Other Ambulatory Visit (HOSPITAL_COMMUNITY): Payer: Self-pay

## 2022-09-02 ENCOUNTER — Other Ambulatory Visit (HOSPITAL_COMMUNITY): Payer: Self-pay

## 2022-09-03 ENCOUNTER — Other Ambulatory Visit (HOSPITAL_COMMUNITY): Payer: Self-pay

## 2022-09-04 ENCOUNTER — Other Ambulatory Visit (HOSPITAL_COMMUNITY): Payer: Self-pay

## 2022-09-25 ENCOUNTER — Other Ambulatory Visit (HOSPITAL_COMMUNITY): Payer: Self-pay

## 2022-09-26 ENCOUNTER — Other Ambulatory Visit: Payer: Self-pay | Admitting: Family Medicine

## 2022-09-26 ENCOUNTER — Other Ambulatory Visit (HOSPITAL_COMMUNITY): Payer: Self-pay

## 2022-09-26 DIAGNOSIS — I10 Essential (primary) hypertension: Secondary | ICD-10-CM

## 2022-09-26 MED ORDER — HYDROCHLOROTHIAZIDE 25 MG PO TABS
25.0000 mg | ORAL_TABLET | Freq: Every day | ORAL | 3 refills | Status: DC
  Filled 2022-09-26: qty 90, 90d supply, fill #0
  Filled 2022-12-26: qty 90, 90d supply, fill #1
  Filled 2023-03-30: qty 90, 90d supply, fill #2
  Filled 2023-07-07: qty 90, 90d supply, fill #3

## 2022-09-26 MED ORDER — AMLODIPINE BESYLATE 2.5 MG PO TABS
2.5000 mg | ORAL_TABLET | Freq: Every day | ORAL | 3 refills | Status: DC
  Filled 2022-09-26: qty 90, 90d supply, fill #0
  Filled 2022-12-26: qty 90, 90d supply, fill #1
  Filled 2023-03-30: qty 90, 90d supply, fill #2
  Filled 2023-07-07: qty 90, 90d supply, fill #3

## 2022-09-27 ENCOUNTER — Other Ambulatory Visit (HOSPITAL_COMMUNITY): Payer: Self-pay

## 2022-10-09 ENCOUNTER — Other Ambulatory Visit (HOSPITAL_COMMUNITY): Payer: Self-pay

## 2022-10-09 ENCOUNTER — Other Ambulatory Visit: Payer: Self-pay

## 2022-11-05 ENCOUNTER — Encounter: Payer: Self-pay | Admitting: Bariatrics

## 2022-11-05 ENCOUNTER — Other Ambulatory Visit (HOSPITAL_COMMUNITY): Payer: Self-pay

## 2022-11-05 ENCOUNTER — Ambulatory Visit (INDEPENDENT_AMBULATORY_CARE_PROVIDER_SITE_OTHER): Payer: 59 | Admitting: Bariatrics

## 2022-11-05 VITALS — BP 128/84 | HR 76 | Temp 99.1°F | Ht 64.0 in | Wt 232.0 lb

## 2022-11-05 DIAGNOSIS — Z6841 Body Mass Index (BMI) 40.0 and over, adult: Secondary | ICD-10-CM

## 2022-11-05 DIAGNOSIS — I10 Essential (primary) hypertension: Secondary | ICD-10-CM

## 2022-11-05 DIAGNOSIS — R5383 Other fatigue: Secondary | ICD-10-CM | POA: Insufficient documentation

## 2022-11-05 DIAGNOSIS — E669 Obesity, unspecified: Secondary | ICD-10-CM | POA: Diagnosis not present

## 2022-11-05 DIAGNOSIS — O24419 Gestational diabetes mellitus in pregnancy, unspecified control: Secondary | ICD-10-CM | POA: Insufficient documentation

## 2022-11-11 ENCOUNTER — Other Ambulatory Visit: Payer: Self-pay

## 2022-11-13 ENCOUNTER — Encounter: Payer: 59 | Admitting: Family Medicine

## 2022-11-15 NOTE — Progress Notes (Signed)
Office: [phone]  /  Fax: [phone]  Initial Visit  Stephanie Long was seen in clinic today to evaluate for obesity. She is interested in losing weight to improve overall health and reduce the risk of weight related complications. She presents today to review program treatment options, initial physical assessment, and evaluation.     She was referred by: PCP  When asked what else they would they like to accomplish? She states: Improve existing medical conditions and Improve quality of life  When asked how has your weight affected you? She states: Having fatigue and Having poor endurance  Some associated conditions: Hypertension and Other: gestational diabetes  Contributing factors: Family history and Nutritional  Weight promoting medications identified: None  Current nutrition plan: None and Portion control / smart choices  Current level of physical activity: None  Current or previous pharmacotherapy: Metformin  Response to medication: Never tried medications   Past medical history includes:   Past Medical History:  Diagnosis Date   Allergy    Asthma    GERD (gastroesophageal reflux disease)    Gestational diabetes    Gestational hypertension 05/09/2013   Hx of varicella    PCOS (polycystic ovarian syndrome) 2012   Plantar fasciitis    Symptomatic cholelithiasis      Objective:   BP 128/84   Pulse 76   Temp 99.1 F (37.3 C)   Ht 5\' 4"  (1.626 m)   Wt 232 lb (105.2 kg)   SpO2 94%   BMI 39.82 kg/m  She was weighed on the bioimpedance scale: Body mass index is 39.82 kg/m.  Visceral Fat Rating: 13, Body Fat%: 46.2  General:  Alert, oriented and cooperative. Patient is in no acute distress.  Respiratory: Normal respiratory effort, no problems with respiration noted  Extremities: Normal range of motion.    Mental Status: Normal mood and affect. Normal behavior. Normal judgment and thought content.   Assessment and Plan:  1. Other fatigue Stephanie Long  endorses certain activities cause fatigue,  Plan: Obtain EKG at new patient visit.  - EKG 12-Lead; Future  2. Essential hypertension Stephanie Long is taking Norvasc and HCTZ.  Plan: Continue Norvasc and HCTZ.  3. Gestational diabetes mellitus (GDM), antepartum, gestational diabetes method of control unspecified Stephanie Long was diagnosed in her 2nd trimester. She is not on medication.  Plan: Minimize all carbohydrates- sweets and starches.  4. Obesity, Current BMI 40.0 Return to clinic in 2 weeks for initial visit and IC.     Obesity Treatment Plan:  She will work on garnering support from family and friends to begin weight loss journey. Work on eliminating or reducing the presence of highly processed, calorie dense foods in the home. Complete provided nutritional and psychosocial assessment questionnaire prior to her next visit.  Assess for cardiometabolic complications and nutritional deficiencies via fasting serologies.  Assess REE via indirect calorimetry to guide the creation of a reduced calorie, high protein meal plan to promote loss of fat mass.   She will think about ideas on how to incorporate physical activity in their daily routine.  Stephanie Long will follow up in the next 1-2 weeks to review the above steps and continue evaluation and treatment.  Obesity Education Performed Today:  She was weighed on the bioimpedance scale and results were discussed and documented in the synopsis.  We discussed obesity as a disease and the importance of a more detailed evaluation of all the factors contributing to the disease.  We discussed the importance of long term  lifestyle changes which include nutrition, exercise and behavioral modifications as well as the importance of customizing this to her specific health and social needs.  We discussed the benefits of reaching a healthier weight to alleviate the symptoms of existing conditions and reduce the risks of the biomechanical,  metabolic and psychological effects of obesity.  Stephanie Long appears to be in the action stage of change and states they are ready to start intensive lifestyle modifications and behavioral modifications.  I, Kathlene November, BS, CMA, am acting as transcriptionist for [employer], DO.  Jearld Lesch, DO

## 2022-11-16 NOTE — Patient Instructions (Incomplete)
It was great to see you again today, I will be in touch with your labs soon as possible 

## 2022-11-16 NOTE — Progress Notes (Deleted)
[place] [facility] [address] [place], [place] [address] [phone]  Date:  11/20/2022   Name:  Stephanie Long   DOB:  05-08-1976   MRN:  [id number]  PCP:  Darreld Mclean, MD    Chief Complaint: No chief complaint on file.   History of Present Illness:  Stephanie Long is a 47 y.o. very pleasant female patient who presents with the following:  Patient seen today for physical exam Most recent visit with myself was in December 2022 also for physical History of allergies and mild asthma, [place] fasciitis, overweight, elevated blood pressure taking HCTZ   At that visit we added 2.5 mg of amlodipine to her blood pressure control regimen She had knee surgery for meniscal tear about a year ago  She has been working with the [facility]-establish care with them earlier this month  Pap smear Mammogram Recommend COVID booster Flu shot is complete Can update blood work today Patient Active Problem List   Diagnosis Date Noted   Other fatigue 11/05/2022   Gestational diabetes mellitus (GDM), antepartum 11/05/2022   Complex tear of medial meniscus of right knee 10/11/2021   Essential hypertension 09/27/2021   Right knee pain 10/09/2017   Right foot pain 10/09/2017   Plantar fasciitis of right foot 10/16/2016   Right shoulder pain 10/16/2016   Obesity 10/07/2016   DERMATITIS, OTHER ATOPIC 03/24/2007   Mild persistent asthma without complication [id number]    Past Medical History:  Diagnosis Date   Allergy    Asthma    GERD (gastroesophageal reflux disease)    Gestational diabetes    Gestational hypertension 05/09/2013   Hx of varicella    PCOS (polycystic ovarian syndrome) 2012   Plantar fasciitis    Symptomatic cholelithiasis     Past Surgical History:  Procedure Laterality Date   CESAREAN SECTION N/A 05/09/2013   Procedure: primary cesarean section with delivery of baby  girl at 45.  Apgars 9/9.;  Surgeon: Lovenia Kim, MD;  Location: [facility];  Service: Obstetrics;  Laterality: N/A;   CHOLECYSTECTOMY N/A 06/08/2018   Procedure: LAPAROSCOPIC CHOLECYSTECTOMY;  Surgeon: Georganna Skeans, MD;  Location: [facility];  Service: General;  Laterality: N/A;   KNEE ARTHROSCOPY Right 10/11/2021   Procedure: RIGHT KNEE ARTHROSCOPY WITH PARTIAL MEDIAL MENISCECTOMY;  Surgeon: Mcarthur Rossetti, MD;  Location: [facility];  Service: Orthopedics;  Laterality: Right;   WISDOM TOOTH EXTRACTION     WISDOM TOOTH EXTRACTION      Social History   Tobacco Use   Smoking status: Never   Smokeless tobacco: Never  Vaping Use   Vaping Use: Never used  Substance Use Topics   Alcohol use: Yes    Alcohol/week: 2.0 standard drinks of alcohol    Types: 1 Glasses of wine, 1 Standard drinks or equivalent per week   Drug use: No    Family History  Problem Relation Age of Onset   Diabetes Father    Heart disease Father    Hyperlipidemia Father    Heart attack Father    Depression Mother    Stroke Maternal Grandmother    Cancer Neg Hx    Asthma Neg Hx    Early death Neg Hx     No Known Allergies  Medication list has been reviewed and updated.  Current Outpatient Medications on File Prior to Visit  Medication Sig Dispense Refill   Acetaminophen (TYLENOL  PO) Take 1 tablet by mouth as needed.     amLODipine (NORVASC) 2.5 MG tablet Take 1 tablet (2.5 mg total) by mouth daily. 90 tablet 3   calcium carbonate (TUMS - DOSED IN MG ELEMENTAL CALCIUM) 500 MG chewable tablet Chew 1 tablet by mouth daily as needed for indigestion or heartburn.     clobetasol cream (TEMOVATE) 0.05 % Apply at night to eczema up to 4-6 weeks as needed. Not safe for long term use. 60 g 1   Dupilumab (DUPIXENT) 300 MG/2ML SOPN Injects 2 pens on day zero, then injects one every two weeks after 6 mL 4   fluticasone (FLOVENT HFA) 110 MCG/ACT inhaler Inhale 1 puff into the lungs in the morning  and at bedtime. 12 g 12   Glucosamine 500 MG CAPS Take 1,000 mg by mouth daily.     hydrochlorothiazide (HYDRODIURIL) 25 MG tablet Take 1 tablet (25 mg total) by mouth daily. 90 tablet 3   IBUPROFEN PO Take 1 tablet by mouth as needed.     Melatonin 5 MG TABS Take by mouth.     Multiple Vitamin (MULTIVITAMIN) tablet Take 1 tablet by mouth daily.     triamcinolone ointment (KENALOG) 0.5 % Apply topically 2 times daily as needed for eczema on hands 45 g 2   VENTOLIN HFA 108 (90 Base) MCG/ACT inhaler INHALE 2 PUFFS INTO THE LUNGS EVERY 6 HOURS AS NEEDED FOR WHEEZING OR SHORTNESS OF BREATH. 18 g 6   VITAMIN D PO Take by mouth.     No current facility-administered medications on file prior to visit.    Review of Systems:  As per HPI- otherwise negative.   Physical Examination: There were no vitals filed for this visit. There were no vitals filed for this visit. There is no height or weight on file to calculate BMI. Ideal Body Weight:    GEN: no acute distress. HEENT: Atraumatic, Normocephalic.  Ears and Nose: No external deformity. CV: RRR, No M/G/R. No JVD. No thrill. No extra heart sounds. PULM: CTA B, no wheezes, crackles, rhonchi. No retractions. No resp. distress. No accessory muscle use. ABD: S, NT, ND, +BS. No rebound. No HSM. EXTR: No c/c/e PSYCH: Normally interactive. Conversant.    Assessment and Plan: ***  Signed Lamar Blinks, MD

## 2022-11-18 ENCOUNTER — Ambulatory Visit (INDEPENDENT_AMBULATORY_CARE_PROVIDER_SITE_OTHER): Payer: 59 | Admitting: Bariatrics

## 2022-11-18 ENCOUNTER — Encounter: Payer: Self-pay | Admitting: Bariatrics

## 2022-11-18 VITALS — BP 128/76 | HR 60 | Temp 97.9°F | Ht 64.0 in | Wt 234.0 lb

## 2022-11-18 DIAGNOSIS — O24419 Gestational diabetes mellitus in pregnancy, unspecified control: Secondary | ICD-10-CM | POA: Diagnosis not present

## 2022-11-18 DIAGNOSIS — E669 Obesity, unspecified: Secondary | ICD-10-CM

## 2022-11-18 DIAGNOSIS — R5383 Other fatigue: Secondary | ICD-10-CM | POA: Diagnosis not present

## 2022-11-18 DIAGNOSIS — I10 Essential (primary) hypertension: Secondary | ICD-10-CM | POA: Diagnosis not present

## 2022-11-18 DIAGNOSIS — J4530 Mild persistent asthma, uncomplicated: Secondary | ICD-10-CM

## 2022-11-18 DIAGNOSIS — E559 Vitamin D deficiency, unspecified: Secondary | ICD-10-CM | POA: Diagnosis not present

## 2022-11-18 DIAGNOSIS — Z1331 Encounter for screening for depression: Secondary | ICD-10-CM | POA: Diagnosis not present

## 2022-11-18 DIAGNOSIS — Z0000 Encounter for general adult medical examination without abnormal findings: Secondary | ICD-10-CM

## 2022-11-18 DIAGNOSIS — R0602 Shortness of breath: Secondary | ICD-10-CM | POA: Insufficient documentation

## 2022-11-18 DIAGNOSIS — Z0289 Encounter for other administrative examinations: Secondary | ICD-10-CM

## 2022-11-18 DIAGNOSIS — Z6841 Body Mass Index (BMI) 40.0 and over, adult: Secondary | ICD-10-CM | POA: Diagnosis not present

## 2022-11-18 DIAGNOSIS — E538 Deficiency of other specified B group vitamins: Secondary | ICD-10-CM | POA: Diagnosis not present

## 2022-11-19 ENCOUNTER — Encounter (INDEPENDENT_AMBULATORY_CARE_PROVIDER_SITE_OTHER): Payer: Self-pay | Admitting: Bariatrics

## 2022-11-19 DIAGNOSIS — E88819 Insulin resistance, unspecified: Secondary | ICD-10-CM | POA: Insufficient documentation

## 2022-11-19 DIAGNOSIS — R7303 Prediabetes: Secondary | ICD-10-CM | POA: Insufficient documentation

## 2022-11-19 LAB — LIPID PANEL WITH LDL/HDL RATIO
Cholesterol, Total: 138 mg/dL (ref 100–199)
HDL: 46 mg/dL (ref >39)
LDL Chol Calc (NIH): 77 mg/dL (ref 0–99)
LDL/HDL Ratio: 1.7 ratio (ref 0.0–3.2)
Triglycerides: 77 mg/dL (ref 0–149)
VLDL Cholesterol Cal: 15 mg/dL (ref 5–40)

## 2022-11-19 LAB — COMPREHENSIVE METABOLIC PANEL
ALT: 10 IU/L (ref 0–32)
AST: 11 IU/L (ref 0–40)
Albumin/Globulin Ratio: 1.6 (ref 1.2–2.2)
Albumin: 4.2 g/dL (ref 3.9–4.9)
Alkaline Phosphatase: 104 IU/L (ref 44–121)
BUN/Creatinine Ratio: 15 (ref 9–23)
BUN: 13 mg/dL (ref 6–24)
Bilirubin Total: 0.5 mg/dL (ref 0.0–1.2)
CO2: 25 mmol/L (ref 20–29)
Calcium: 9.3 mg/dL (ref 8.7–10.2)
Chloride: 101 mmol/L (ref 96–106)
Creatinine, Ser: 0.85 mg/dL (ref 0.57–1.00)
Globulin, Total: 2.6 g/dL (ref 1.5–4.5)
Glucose: 90 mg/dL (ref 70–99)
Potassium: 3.9 mmol/L (ref 3.5–5.2)
Sodium: 142 mmol/L (ref 134–144)
Total Protein: 6.8 g/dL (ref 6.0–8.5)
eGFR: 87 mL/min/{1.73_m2} (ref >59)

## 2022-11-19 LAB — TSH+T4F+T3FREE
Free T4: 1.12 ng/dL (ref 0.82–1.77)
T3, Free: 3 pg/mL (ref 2.0–4.4)
TSH: 1.97 u[IU]/mL (ref 0.450–4.500)

## 2022-11-19 LAB — VITAMIN B12: Vitamin B-12: 473 pg/mL (ref 232–1245)

## 2022-11-19 LAB — HEMOGLOBIN A1C
Est. average glucose Bld gHb Est-mCnc: 120 mg/dL
Hgb A1c MFr Bld: 5.8 % — ABNORMAL HIGH (ref 4.8–5.6)

## 2022-11-19 LAB — INSULIN, RANDOM: INSULIN: 27.7 u[IU]/mL — ABNORMAL HIGH (ref 2.6–24.9)

## 2022-11-19 LAB — VITAMIN D 25 HYDROXY (VIT D DEFICIENCY, FRACTURES): Vit D, 25-Hydroxy: 28.6 ng/mL — ABNORMAL LOW (ref 30.0–100.0)

## 2022-11-20 ENCOUNTER — Encounter: Payer: 59 | Admitting: Family Medicine

## 2022-11-28 NOTE — Progress Notes (Signed)
[place] [place] [facility] [place], [place], [place] [address] [phone]  Date:  12/02/2022   Name:  Stephanie Long   DOB:  1976/03/17   MRN:  [id number]  PCP:  Darreld Mclean, MD    Chief Complaint: Annual Exam   History of Present Illness:  Stephanie Long is a 47 y.o. very pleasant female patient who presents with the following:  Seen today for a CPE Last seen by myself 12/22-  History of allergies and mild asthma, Planter fasciitis, overweight, elevated blood pressure taking HCTZ, prediabetes  Pap smear- per Dr Garwin Brothers, UTD Mammo- per Dr Conception Chancy booster recommended  Labs done earlier this month but not a CBC   She is going to the weight and wellness center as of the last month- they have her working on healthy diet and lower carbs   Lab Results  Component Value Date   HGBA1C 5.8 (H) 11/18/2022   Wt Readings from Last 3 Encounters:  12/02/22 235 lb 9.6 oz (106.9 kg)  11/18/22 234 lb (106.1 kg)  11/05/22 232 lb (105.2 kg)   She is doing Dupixent for her eczema and it is really helping -her dyshidrotic eczema on her hands is much better  No family history of colon cancer Patient Active Problem List   Diagnosis Date Noted   Prediabetes 11/19/2022   Insulin resistance 11/19/2022   SOB (shortness of breath) on exertion 11/18/2022   Health care maintenance 11/18/2022   Vitamin D deficiency 11/18/2022   B12 deficiency 11/18/2022   Other fatigue 11/05/2022   Gestational diabetes mellitus (GDM), antepartum 11/05/2022   Complex tear of medial meniscus of right knee 10/11/2021   Essential hypertension 09/27/2021   Right knee pain 10/09/2017   Right foot pain 10/09/2017   Plantar fasciitis of right foot 10/16/2016   Right shoulder pain 10/16/2016   Obesity 10/07/2016   DERMATITIS, OTHER ATOPIC 03/24/2007   Mild persistent asthma without complication 32/35/5732    Past Medical History:   Diagnosis Date   Allergy    Anxiety    Asthma    Back pain    Depression    GERD (gastroesophageal reflux disease)    Gestational diabetes    Gestational hypertension 05/09/2013   Hx of varicella    Joint pain    Osteoarthritis    PCOS (polycystic ovarian syndrome) 2012   Plantar fasciitis    Symptomatic cholelithiasis     Past Surgical History:  Procedure Laterality Date   CESAREAN SECTION N/A 05/09/2013   Procedure: primary cesarean section with delivery of baby girl at 2.  Apgars 9/9.;  Surgeon: Lovenia Kim, MD;  Location: [facility];  Service: Obstetrics;  Laterality: N/A;   CHOLECYSTECTOMY N/A 06/08/2018   Procedure: LAPAROSCOPIC CHOLECYSTECTOMY;  Surgeon: Georganna Skeans, MD;  Location: [facility];  Service: General;  Laterality: N/A;   KNEE ARTHROSCOPY Right 10/11/2021   Procedure: RIGHT KNEE ARTHROSCOPY WITH PARTIAL MEDIAL MENISCECTOMY;  Surgeon: Mcarthur Rossetti, MD;  Location: [facility];  Service: Orthopedics;  Laterality: Right;   WISDOM TOOTH EXTRACTION     WISDOM TOOTH EXTRACTION      Social History   Tobacco Use   Smoking status: Never   Smokeless tobacco: Never  Vaping Use   Vaping Use: Never used  Substance Use Topics   Alcohol use: Yes    Alcohol/week: 2.0 standard drinks of alcohol    Types: 1 Glasses  of wine, 1 Standard drinks or equivalent per week   Drug use: No    Family History  Problem Relation Age of Onset   Depression Mother    Hypertension Mother    Diabetes Father    Heart disease Father    Hyperlipidemia Father    Heart attack Father    Hypertension Father    Stroke Maternal Grandmother    Cancer Neg Hx    Asthma Neg Hx    Early death Neg Hx     No Known Allergies  Medication list has been reviewed and updated.  Current Outpatient Medications on File Prior to Visit  Medication Sig Dispense Refill   Acetaminophen (TYLENOL PO) Take 1 tablet by mouth as needed.     amLODipine (NORVASC) 2.5 MG tablet Take  1 tablet (2.5 mg total) by mouth daily. 90 tablet 3   ASHWAGANDHA PO Take by mouth.     calcium carbonate (TUMS - DOSED IN MG ELEMENTAL CALCIUM) 500 MG chewable tablet Chew 1 tablet by mouth daily as needed for indigestion or heartburn.     clobetasol cream (TEMOVATE) 0.05 % Apply at night to eczema up to 4-6 weeks as needed. Not safe for long term use. 60 g 1   Dupilumab (DUPIXENT) 300 MG/2ML SOPN Injects 2 pens on day zero, then injects one every two weeks after 6 mL 4   fluticasone (FLOVENT HFA) 110 MCG/ACT inhaler Inhale 1 puff into the lungs in the morning and at bedtime. (Patient not taking: Reported on 11/18/2022) 12 g 12   Glucosamine 500 MG CAPS Take 1,000 mg by mouth daily. (Patient not taking: Reported on 11/18/2022)     hydrochlorothiazide (HYDRODIURIL) 25 MG tablet Take 1 tablet (25 mg total) by mouth daily. 90 tablet 3   IBUPROFEN PO Take 1 tablet by mouth as needed. (Patient not taking: Reported on 11/18/2022)     MAGNESIUM CITRATE PO Take by mouth.     Melatonin 5 MG TABS Take by mouth.     Multiple Vitamin (MULTIVITAMIN) tablet Take 1 tablet by mouth daily. (Patient not taking: Reported on 11/18/2022)     triamcinolone ointment (KENALOG) 0.5 % Apply topically 2 times daily as needed for eczema on hands (Patient not taking: Reported on 11/18/2022) 45 g 2   VENTOLIN HFA 108 (90 Base) MCG/ACT inhaler INHALE 2 PUFFS INTO THE LUNGS EVERY 6 HOURS AS NEEDED FOR WHEEZING OR SHORTNESS OF BREATH. 18 g 6   VITAMIN D PO Take by mouth. (Patient not taking: Reported on 11/18/2022)     No current facility-administered medications on file prior to visit.    Review of Systems:  As per HPI- otherwise negative.   Physical Examination: Vitals:   12/02/22 1338  BP: 120/78  Pulse: 69  Resp: 18  Temp: 98.3 F (36.8 C)  SpO2: 96%   Vitals:   12/02/22 1338  Weight: 235 lb 9.6 oz (106.9 kg)  Height: 5\' 4"  (1.626 m)   Body mass index is 40.44 kg/m. Ideal Body Weight: Weight in (lb) to have  BMI = 25: 145.3  GEN: no acute distress.  Obese, looks well HEENT: Atraumatic, Normocephalic.  Bilateral TM wnl, oropharynx normal.  PEERL,EOMI.   Ears and Nose: No external deformity. CV: RRR, No M/G/R. No JVD. No thrill. No extra heart sounds. PULM: CTA B, no wheezes, crackles, rhonchi. No retractions. No resp. distress. No accessory muscle use. ABD: S, NT, ND, +BS. No rebound. No HSM. EXTR: No c/c/e PSYCH: Normally interactive.  Conversant.    Assessment and Plan: Physical exam  Physical exam- encouraged healthy diet and exercise routine She has her mammogram and Pap done per her gynecologist, will request these records She is going to the weight and wellness center, right now they are just working on diet changes.  We discussed trying a GLP-1 drug today, she will think about this Reminded her that colon cancer screening will be due after her next birthday Labs are up-to-date except CBC has not been drawn recently.  I offered to get a CBC today, patient prefers to do at her next routine blood draw at the wellness center Signed Lamar Blinks, MD

## 2022-11-28 NOTE — Patient Instructions (Addendum)
Good to see you again today Recommend covid booster if not up to date  We can plan for colon cancer screening when you turn 47  Please ask for a CBC at next routine blood draw  Best of luck with the wellness center!

## 2022-12-02 ENCOUNTER — Ambulatory Visit: Payer: 59 | Admitting: Bariatrics

## 2022-12-02 ENCOUNTER — Encounter: Payer: Self-pay | Admitting: Family Medicine

## 2022-12-02 ENCOUNTER — Ambulatory Visit (INDEPENDENT_AMBULATORY_CARE_PROVIDER_SITE_OTHER): Payer: 59 | Admitting: Family Medicine

## 2022-12-02 VITALS — BP 120/78 | HR 69 | Temp 98.3°F | Resp 18 | Ht 64.0 in | Wt 235.6 lb

## 2022-12-02 DIAGNOSIS — Z0000 Encounter for general adult medical examination without abnormal findings: Secondary | ICD-10-CM

## 2022-12-02 NOTE — Progress Notes (Unsigned)
Chief Complaint:   OBESITY Stephanie Long (MR# [id number]) is a 47 y.o. female who presents for evaluation and treatment of obesity and related comorbidities. Current BMI is Body mass index is 40.17 kg/m. Stephanie Long has been struggling with her weight for many years and has been unsuccessful in either losing weight, maintaining weight loss, or reaching her healthy weight goal.  Stephanie Long is here for her initial visit, but she was seen for an information session on 11/05/2022.  Stephanie Long is currently in the action stage of change and ready to dedicate time achieving and maintaining a healthier weight. Stephanie Long is interested in becoming our patient and working on intensive lifestyle modifications including (but not limited to) diet and exercise for weight loss.  Stephanie Long's habits were reviewed today and are as follows: Her family eats meals together, she thinks her family will eat healthier with her, she struggles with family and or coworkers weight loss sabotage, her desired weight loss is 54-64 lbs, she has been heavy most of her life, she started gaining weight in elementary school, her heaviest weight ever was 255 pounds, she has significant food cravings issues, she snacks frequently in the evenings, she skips meals frequently, she is frequently drinking liquids with calories, she frequently makes poor food choices, she frequently eats larger portions than normal, and she struggles with emotional eating.  Depression Screen Stephanie Long's Food and Mood (modified PHQ-9) score was 13.  Subjective:   1. Other fatigue Stephanie Long admits to daytime somnolence and admits to waking up still tired. Patient has a history of symptoms of daytime fatigue, morning fatigue, and morning headache. Stephanie Long generally gets 6 or 8 hours of sleep per night, and states that she has nightime awakenings and generally restful sleep. Snoring is present. Apneic episodes are not present. Epworth Sleepiness Score is 7.    2. SOB (shortness of breath) on exertion Stephanie Long notes increasing shortness of breath with exercising and seems to be worsening over time with weight gain. She notes getting out of breath sooner with activity than she used to. This has not gotten worse recently. Stephanie Long denies shortness of breath at rest or orthopnea.  3. Essential hypertension Stephanie Long is taking Norvasc and hydrochlorothiazide.  Her blood pressure is usually controlled.  4. Gestational diabetes mellitus (GDM), antepartum, gestational diabetes method of control unspecified Stephanie Long was diagnosed in her second trimester.  She is not on medications currently.  5. Health care maintenance Given obesity.   6. Mild persistent asthma without complication Stephanie Long is taking Flovent and Ventolin as needed.  7. Vitamin D deficiency Stephanie Long is taking multivitamins inconsistently.  8. B12 deficiency Stephanie Long is taking multivitamins inconsistently.  Assessment/Plan:   1. Other fatigue Stephanie Long does feel that her weight is causing her energy to be lower than it should be. Fatigue may be related to obesity, depression or many other causes. Labs will be ordered, and in the meanwhile, Stephanie Long will focus on self care including making healthy food choices, increasing physical activity and focusing on stress reduction.  - EKG 12-Lead - TSH+T4F+T3Free  2. SOB (shortness of breath) on exertion Stephanie Long does feel that she gets out of breath more easily that she used to when she exercises. Stephanie Long's shortness of breath appears to be obesity related and exercise induced. She has agreed to work on weight loss and gradually increase exercise to treat her exercise induced shortness of breath. Will continue to monitor closely.  - TSH+T4F+T3Free  3. Essential hypertension Stephanie Long will continue her medications, and  we will follow-up at her next visit.  4. Gestational diabetes mellitus (GDM), antepartum, gestational diabetes method of  control unspecified We will check labs today, and we will follow-up at Stephanie Long's next visit.  - Insulin, random - Hemoglobin A1c  5. Health care maintenance We will check labs today.  EKG and IC were done today, and results were reviewed with the patient.  - Lipid Panel With LDL/HDL Ratio - Insulin, random - Hemoglobin A1c - TSH+T4F+T3Free - VITAMIN D 25 Hydroxy (Vit-D Deficiency, Fractures) - Comprehensive metabolic panel - Vitamin H20  6. Mild persistent asthma without complication Stephanie Long will continue her medications as needed.  7. Vitamin D deficiency We will check labs today, and we will follow-up at Stephanie Long's next visit.  - VITAMIN D 25 Hydroxy (Vit-D Deficiency, Fractures)  8. B12 deficiency We will check labs today, and we will follow-up at Stephanie Long's next visit.  - Vitamin B12  9. Depression screening Stephanie Long had a positive depression screening. Depression is commonly associated with obesity and often results in emotional eating behaviors. We will monitor this closely and work on CBT to help improve the non-hunger eating patterns. Referral to Psychology may be required if no improvement is seen as she continues in our clinic.  10. Obesity, Current BMI 40.3 Stephanie Long is currently in the action stage of change and her goal is to continue with weight loss efforts. I recommend Stephanie Long begin the structured treatment plan as follows:  She has agreed to the Category 2 Plan + 100 calories.  Meal planning was discussed.  Review labs from 09/27/2021, CMP, lipids, and CBC.  Exercise goals: No exercise has been prescribed at this time.   Behavioral modification strategies: increasing lean protein intake, decreasing simple carbohydrates, increasing vegetables, increasing water intake, decreasing eating out, no skipping meals, meal planning and cooking strategies, keeping healthy foods in the home, and planning for success.  She was informed of the importance of frequent  follow-up visits to maximize her success with intensive lifestyle modifications for her multiple health conditions. She was informed we would discuss her lab results at her next visit unless there is a critical issue that needs to be addressed sooner. Ambrosia agreed to keep her next visit at the agreed upon time to discuss these results.  Objective:   Blood pressure 128/76, pulse 60, temperature 97.9 F (36.6 C), height 5\' 4"  (1.626 m), weight 234 lb (106.1 kg), SpO2 97 %. Body mass index is 40.17 kg/m.  EKG: Normal sinus rhythm, rate 60 BPM.  Indirect Calorimeter completed today shows a VO2 of 258 and a REE of 1786.  Her calculated basal metabolic rate is 9470 thus her basal metabolic rate is better than expected.  General: Cooperative, alert, well developed, in no acute distress. HEENT: Conjunctivae and lids unremarkable. Cardiovascular: Regular rhythm.  Lungs: Normal work of breathing. Neurologic: No focal deficits.   Lab Results  Component Value Date   CREATININE 0.85 11/18/2022   BUN 13 11/18/2022   NA 142 11/18/2022   K 3.9 11/18/2022   CL 101 11/18/2022   CO2 25 11/18/2022   Lab Results  Component Value Date   ALT 10 11/18/2022   AST 11 11/18/2022   ALKPHOS 104 11/18/2022   BILITOT 0.5 11/18/2022   Lab Results  Component Value Date   HGBA1C 5.8 (H) 11/18/2022   HGBA1C 5.3 09/27/2021   HGBA1C 5.4 06/14/2020   HGBA1C 5.5 05/05/2019   HGBA1C 5.5 03/02/2018   Lab Results  Component Value Date  INSULIN 27.7 (H) 11/18/2022   Lab Results  Component Value Date   TSH 1.970 11/18/2022   Lab Results  Component Value Date   CHOL 138 11/18/2022   HDL 46 11/18/2022   LDLCALC 77 11/18/2022   TRIG 77 11/18/2022   CHOLHDL 3 09/27/2021   Lab Results  Component Value Date   WBC 9.5 09/27/2021   HGB 13.8 09/27/2021   HCT 41.1 09/27/2021   MCV 88.6 09/27/2021   PLT 232.0 09/27/2021   No results found for: "IRON", "TIBC", "FERRITIN"  Attestation Statements:    Reviewed by clinician on day of visit: allergies, medications, problem list, medical history, surgical history, family history, social history, and previous encounter notes.  Wilhemena Durie, am acting as Location manager for [employer], DO.   Time spent on visit including pre-visit chart review and post-visit charting and care was 60 minutes.    I have reviewed the above documentation for accuracy and completeness, and I agree with the above. Jearld Lesch, DO

## 2022-12-03 ENCOUNTER — Encounter: Payer: Self-pay | Admitting: Bariatrics

## 2022-12-03 ENCOUNTER — Other Ambulatory Visit (HOSPITAL_COMMUNITY): Payer: Self-pay

## 2022-12-04 ENCOUNTER — Other Ambulatory Visit: Payer: Self-pay

## 2022-12-04 ENCOUNTER — Other Ambulatory Visit (HOSPITAL_COMMUNITY): Payer: Self-pay

## 2022-12-04 ENCOUNTER — Ambulatory Visit (INDEPENDENT_AMBULATORY_CARE_PROVIDER_SITE_OTHER): Payer: 59 | Admitting: Bariatrics

## 2022-12-04 ENCOUNTER — Encounter: Payer: Self-pay | Admitting: Bariatrics

## 2022-12-04 VITALS — BP 126/77 | HR 52 | Temp 98.5°F | Ht 64.0 in | Wt 231.0 lb

## 2022-12-04 DIAGNOSIS — E669 Obesity, unspecified: Secondary | ICD-10-CM | POA: Diagnosis not present

## 2022-12-04 DIAGNOSIS — R7303 Prediabetes: Secondary | ICD-10-CM | POA: Diagnosis not present

## 2022-12-04 DIAGNOSIS — Z6839 Body mass index (BMI) 39.0-39.9, adult: Secondary | ICD-10-CM | POA: Diagnosis not present

## 2022-12-04 DIAGNOSIS — E559 Vitamin D deficiency, unspecified: Secondary | ICD-10-CM | POA: Diagnosis not present

## 2022-12-04 DIAGNOSIS — Z6838 Body mass index (BMI) 38.0-38.9, adult: Secondary | ICD-10-CM | POA: Insufficient documentation

## 2022-12-04 MED ORDER — VITAMIN D (ERGOCALCIFEROL) 1.25 MG (50000 UNIT) PO CAPS
50000.0000 [IU] | ORAL_CAPSULE | ORAL | 0 refills | Status: DC
  Filled 2022-12-04: qty 5, 35d supply, fill #0

## 2022-12-09 IMAGING — MR MR KNEE*R* W/O CM
7 series · 40 of 40 positions shown · non-contrast
Comparison: Knee radiograph 04/16/2021

CLINICAL DATA: Knee trauma, meniscal/ligament injury suspected,
xray done (Age >= 1y) r/o MMT

EXAM:
MRI OF THE RIGHT KNEE WITHOUT CONTRAST
TECHNIQUE: Multiplanar, multisequence MR imaging of the knee was performed. No
intravenous contrast was administered.

[Series 6: T2 fat-sat · axial · right · 4.0mm · 0.50mm/px · z∈[-64,+88]mm · 6 of 36 slices shown (1 of 3)]
[im 1/36]
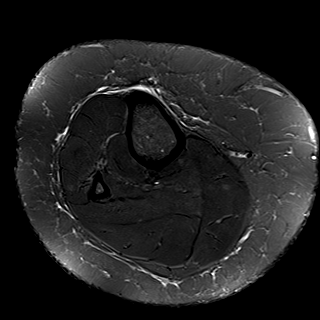
[im 8/36]
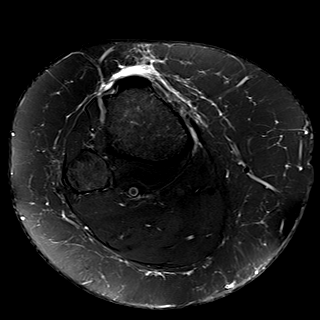
[im 15/36]
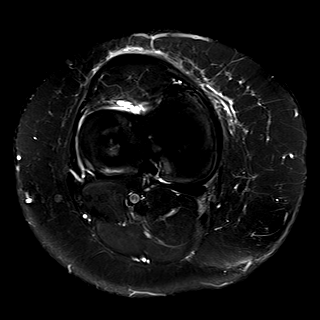
[im 22/36]
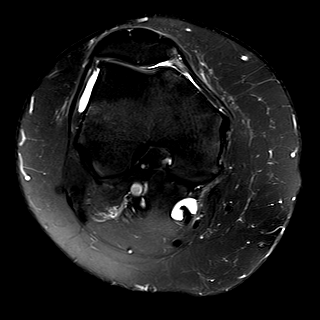
[im 29/36]
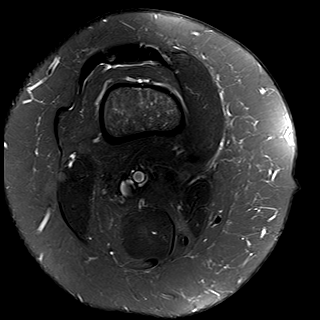
[im 36/36]
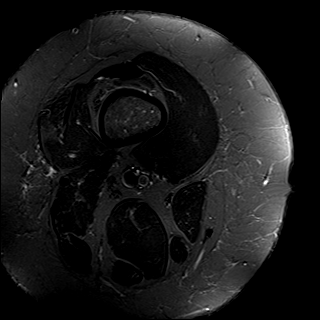

[Series 7: T2 fat-sat · coronal · right · 4.0mm · 0.59mm/px · 5 of 28 slices shown (2 of 3)]
[im 1/28]
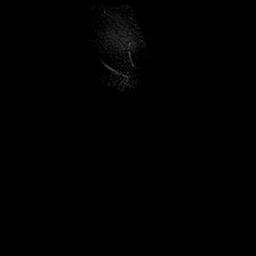
[im 7/28]
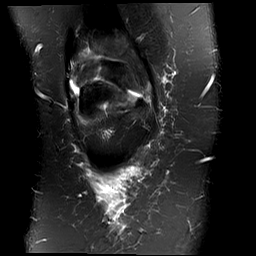
[im 14/28]
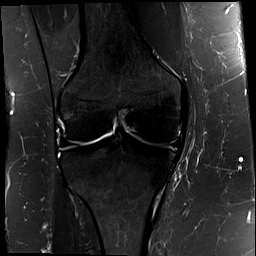
[im 21/28]
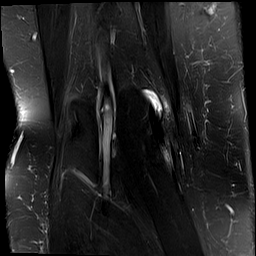
[im 28/28]
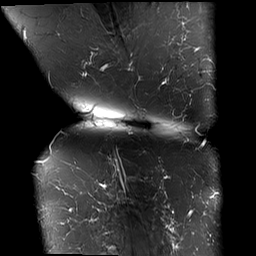

[Series 8: T1 · coronal · right · 4.0mm · 0.59mm/px · 6 of 28 slices shown]
[im 1/28]
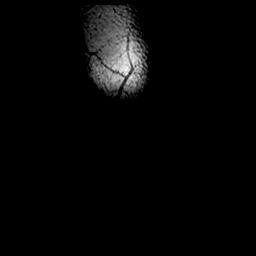
[im 6/28]
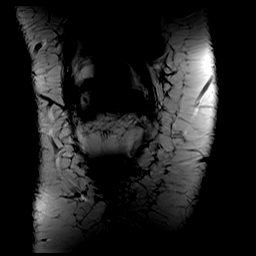
[im 11/28]
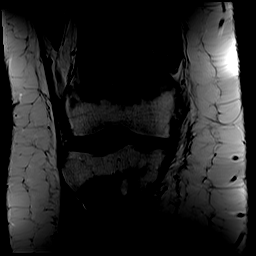
[im 17/28]
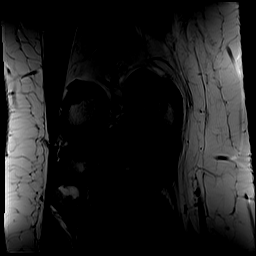
[im 22/28]
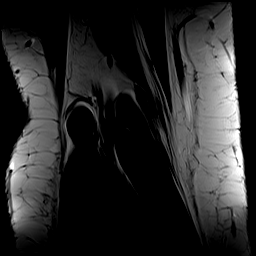
[im 28/28]
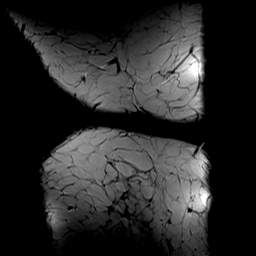

[Series 9: PD fat-sat · coronal · right · 3.0mm · 0.59mm/px · 7 of 33 slices shown (1 of 2)]
[im 1/33]
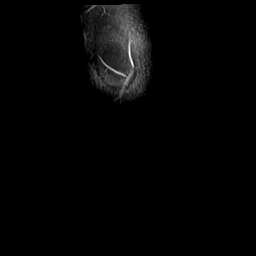
[im 6/33]
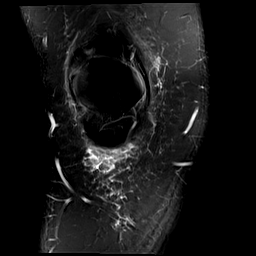
[im 11/33]
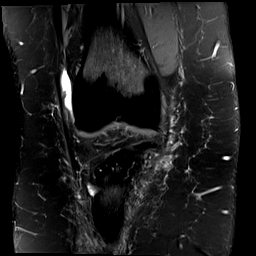
[im 17/33]
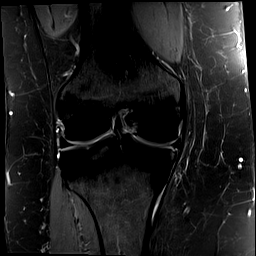
[im 22/33]
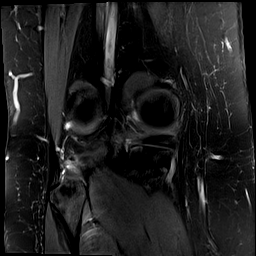
[im 27/33]
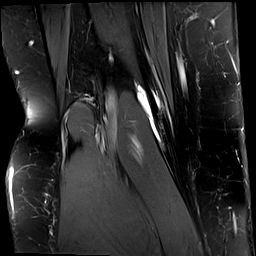
[im 33/33]
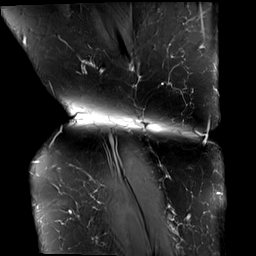

[Series 10: PD fat-sat · sagittal · right · 3.0mm · 0.50mm/px · 6 of 28 slices shown (2 of 2)]
[im 1/28]
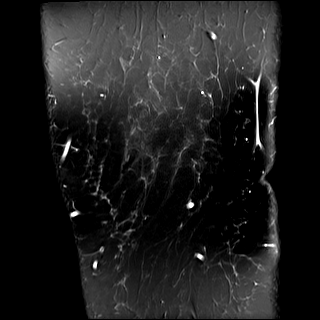
[im 6/28]
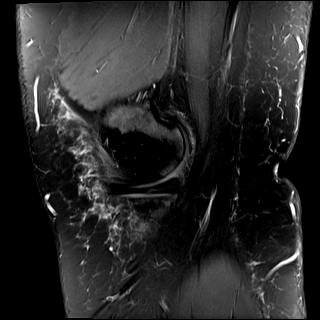
[im 11/28]
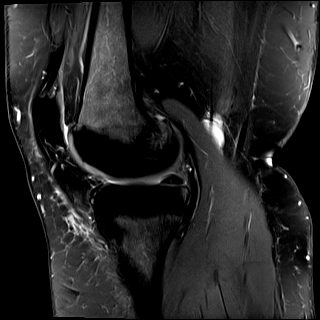
[im 17/28]
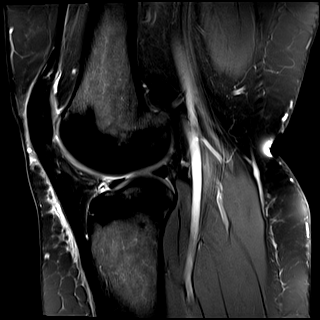
[im 22/28]
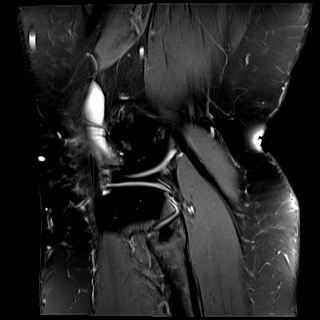
[im 28/28]
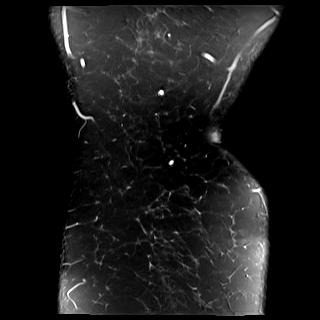

[Series 11: T2 fat-sat · sagittal · right · 3.0mm · 0.50mm/px · 6 of 28 slices shown (3 of 3)]
[im 1/28]
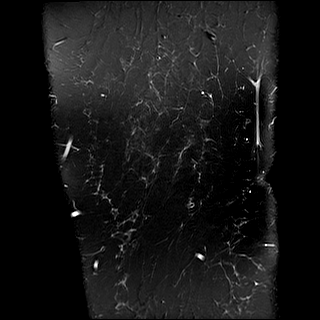
[im 6/28]
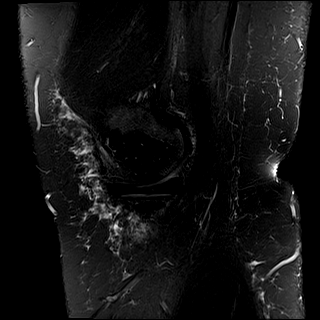
[im 11/28]
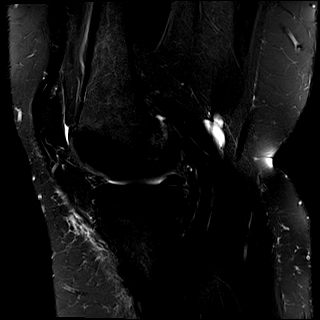
[im 17/28]
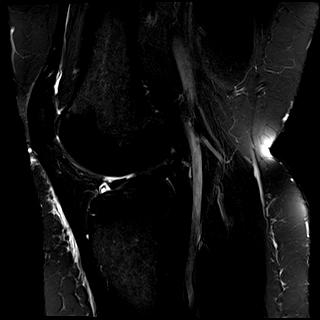
[im 22/28]
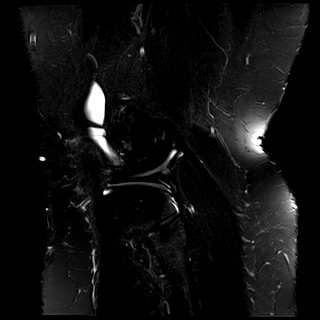
[im 28/28]
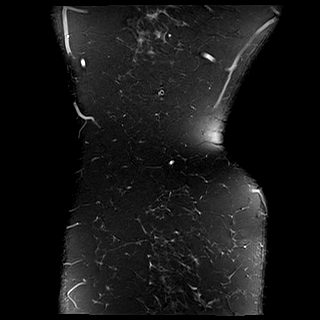

[Series 12: PD · coronal · right · 1.5mm · 0.55mm/px · 4 of 21 slices shown]
[im 1/21]
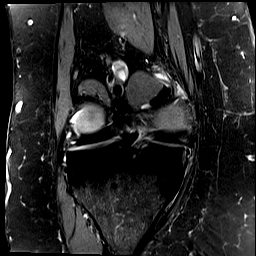
[im 7/21]
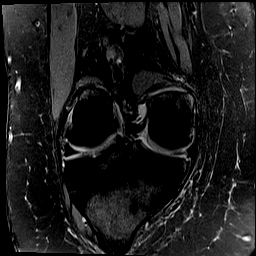
[im 14/21]
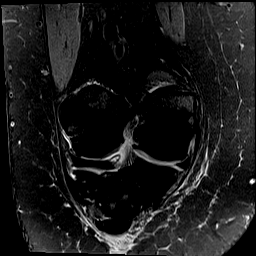
[im 21/21]
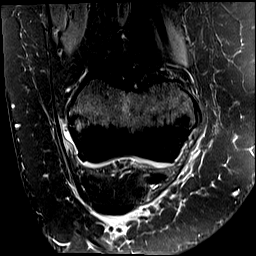

[40 of 40 positions shown; findings below may reference images not displayed]

FINDINGS: MENISCI

Medial: There is a nondisplaced degenerative tear at the posterior
horn-body junction of the medial meniscus. There is possible tearing
at the posterior root.

Lateral: Intact.

LIGAMENTS

Cruciates: ACL and PCL are intact.

Collaterals: Medial collateral ligament is intact. Lateral
collateral ligament complex is intact.

CARTILAGE

Patellofemoral: Mild chondrosis with partial thickness cartilage
loss of the medial patellar facet and trochlear fissuring.

Medial:  Mild chondrosis.

Lateral: Mild chondrosis with partial thickness defect measuring
x 0.4 cm along the lateral tibial plateau (coronal T2 image 13,
sagittal T2 image 19).

JOINT: Small joint effusion.

POPLITEAL FOSSA: Small Baker cyst.

EXTENSOR MECHANISM: Intact quadriceps tendon. Intact patellar
tendon. Intact lateral patellar retinaculum. Intact medial patellar
retinaculum. Intact MPFL.

BONES: Changes of marrow reconversion. No aggressive osseous lesion.

Other: No fluid collection or hematoma. Muscles are normal.
IMPRESSION: Nondisplaced degenerative tear at the posterior horn-body junction
of the medial meniscus. Possible tearing at the posterior medial
meniscal root.

Partial-thickness cartilage loss of the medial patellar facet and
chondral fissuring of the trochlea.

Partial-thickness cartilage defect in the lateral tibial plateau
measuring 1.0 x 0.4 cm.

## 2022-12-12 ENCOUNTER — Other Ambulatory Visit (HOSPITAL_COMMUNITY): Payer: Self-pay

## 2022-12-18 NOTE — Progress Notes (Signed)
Chief Complaint:   OBESITY Stephanie Long is here to discuss her progress with her obesity treatment plan along with follow-up of her obesity related diagnoses. Stephanie Long is on the Category 2 plan and states she is following her eating plan approximately 90% of the time. Stephanie Long states she is walking 10000-12000 steps daily.  Today's visit was #: 2 Starting weight: 234 lbs Starting date: 11/18/22 Today's weight: 231 lbs Today's date: 12/04/22 Total lbs lost to date: 3 Total lbs lost since last in-office visit: 3  Interim History: She is down 3 pounds since her first visit.  She has been hungry on certain days.  Subjective:   1. Vitamin D deficiency Vitamin D level was 28.6.  2. Prediabetes A1c is 5.8.  Insulin is 27.7.  Assessment/Plan:   1. Vitamin D deficiency New prescription: - Vitamin D, Ergocalciferol, (DRISDOL) 1.25 MG (50000 UNIT) CAPS capsule; Take 1 capsule (50,000 Units total) by mouth every 7 (seven) days.  Dispense: 5 capsule; Refill: 0  2. Prediabetes 1.  Handout for diabetes/insulin resistance. 2.  Will keep all carbohydrates low (sugar and starches).  3. Generalized obesity, BMI 39.0-39.9,adult 1.  Meal planning. 2.  Reviewed labs from 11/18/2022: CMP, lipid panel, vitamin D level, A1c, insulin, and thyroid panel. 3.  More lunch and dinner options. 4.  Increase water intake. 5.  Increase fiber.  Stephanie Long is currently in the action stage of change. As such, her goal is to continue with weight loss efforts. She has agreed to the Category 2 plan.  Exercise goals:  as is  Behavioral modification strategies: increasing lean protein intake, decreasing simple carbohydrates, increasing vegetables, increasing water intake, decreasing eating out, no skipping meals, meal planning and cooking strategies, keeping healthy foods in the home, avoiding temptations, and planning for success.  Stephanie Long has agreed to follow-up with our clinic in 2 weeks. She was informed of  the importance of frequent follow-up visits to maximize her success with intensive lifestyle modifications for her multiple health conditions.   Objective:   Blood pressure 126/77, pulse (!) 52, temperature 98.5 F (36.9 C), height '5\' 4"'$  (1.626 m), weight 231 lb (104.8 kg), SpO2 98 %. Body mass index is 39.65 kg/m.  General: Cooperative, alert, well developed, in no acute distress. HEENT: Conjunctivae and lids unremarkable. Cardiovascular: Regular rhythm.  Lungs: Normal work of breathing. Neurologic: No focal deficits.   Lab Results  Component Value Date   CREATININE 0.85 11/18/2022   BUN 13 11/18/2022   NA 142 11/18/2022   K 3.9 11/18/2022   CL 101 11/18/2022   CO2 25 11/18/2022   Lab Results  Component Value Date   ALT 10 11/18/2022   AST 11 11/18/2022   ALKPHOS 104 11/18/2022   BILITOT 0.5 11/18/2022   Lab Results  Component Value Date   HGBA1C 5.8 (H) 11/18/2022   HGBA1C 5.3 09/27/2021   HGBA1C 5.4 06/14/2020   HGBA1C 5.5 05/05/2019   HGBA1C 5.5 03/02/2018   Lab Results  Component Value Date   INSULIN 27.7 (H) 11/18/2022   Lab Results  Component Value Date   TSH 1.970 11/18/2022   Lab Results  Component Value Date   CHOL 138 11/18/2022   HDL 46 11/18/2022   LDLCALC 77 11/18/2022   TRIG 77 11/18/2022   CHOLHDL 3 09/27/2021   Lab Results  Component Value Date   VD25OH 28.6 (L) 11/18/2022   VD25OH 39.09 09/27/2021   Lab Results  Component Value Date   WBC 9.5 09/27/2021  HGB 13.8 09/27/2021   HCT 41.1 09/27/2021   MCV 88.6 09/27/2021   PLT 232.0 09/27/2021   No results found for: "IRON", "TIBC", "FERRITIN"  Attestation Statements:   Reviewed by clinician on day of visit: allergies, medications, problem list, medical history, surgical history, family history, social history, and previous encounter notes.  I, Dawn Whitmire, FNP-C, am acting as transcriptionist for Dr. Jearld Lesch.  I have reviewed the above documentation for accuracy and  completeness, and I agree with the above. Jearld Lesch, DO

## 2022-12-24 ENCOUNTER — Encounter: Payer: Self-pay | Admitting: Bariatrics

## 2022-12-24 ENCOUNTER — Other Ambulatory Visit (HOSPITAL_COMMUNITY): Payer: Self-pay

## 2022-12-24 ENCOUNTER — Ambulatory Visit (INDEPENDENT_AMBULATORY_CARE_PROVIDER_SITE_OTHER): Payer: 59 | Admitting: Bariatrics

## 2022-12-24 VITALS — BP 120/77 | HR 58 | Temp 97.7°F | Ht 64.0 in | Wt 224.0 lb

## 2022-12-24 DIAGNOSIS — I10 Essential (primary) hypertension: Secondary | ICD-10-CM | POA: Diagnosis not present

## 2022-12-24 DIAGNOSIS — E559 Vitamin D deficiency, unspecified: Secondary | ICD-10-CM | POA: Diagnosis not present

## 2022-12-24 DIAGNOSIS — Z6838 Body mass index (BMI) 38.0-38.9, adult: Secondary | ICD-10-CM

## 2022-12-24 DIAGNOSIS — E669 Obesity, unspecified: Secondary | ICD-10-CM | POA: Diagnosis not present

## 2022-12-24 MED ORDER — VITAMIN D (ERGOCALCIFEROL) 1.25 MG (50000 UNIT) PO CAPS
50000.0000 [IU] | ORAL_CAPSULE | ORAL | 0 refills | Status: DC
  Filled 2022-12-24 – 2023-01-06 (×2): qty 5, 35d supply, fill #0

## 2022-12-26 ENCOUNTER — Other Ambulatory Visit (HOSPITAL_COMMUNITY): Payer: Self-pay

## 2023-01-01 ENCOUNTER — Other Ambulatory Visit (HOSPITAL_COMMUNITY): Payer: Self-pay

## 2023-01-03 ENCOUNTER — Other Ambulatory Visit (HOSPITAL_COMMUNITY): Payer: Self-pay

## 2023-01-06 ENCOUNTER — Other Ambulatory Visit (HOSPITAL_COMMUNITY): Payer: Self-pay

## 2023-01-06 ENCOUNTER — Other Ambulatory Visit: Payer: Self-pay

## 2023-01-08 ENCOUNTER — Encounter: Payer: Self-pay | Admitting: Bariatrics

## 2023-01-08 ENCOUNTER — Ambulatory Visit (INDEPENDENT_AMBULATORY_CARE_PROVIDER_SITE_OTHER): Payer: 59 | Admitting: Bariatrics

## 2023-01-08 VITALS — BP 122/78 | HR 55 | Temp 97.8°F | Ht 64.0 in | Wt 223.0 lb

## 2023-01-08 DIAGNOSIS — E559 Vitamin D deficiency, unspecified: Secondary | ICD-10-CM | POA: Diagnosis not present

## 2023-01-08 DIAGNOSIS — Z6838 Body mass index (BMI) 38.0-38.9, adult: Secondary | ICD-10-CM

## 2023-01-08 DIAGNOSIS — E669 Obesity, unspecified: Secondary | ICD-10-CM | POA: Diagnosis not present

## 2023-01-08 DIAGNOSIS — R7303 Prediabetes: Secondary | ICD-10-CM | POA: Diagnosis not present

## 2023-01-08 NOTE — Progress Notes (Signed)
WEIGHT SUMMARY AND BIOMETRICS  Weight Lost Since Last Visit: 1lb   Vitals Temp: 97.8 F (36.6 C) BP: 122/78 Pulse Rate: (!) 55 SpO2: 95 %   Anthropometric Measurements Height: '5\' 4"'$  (1.626 m) Weight: 223 lb (101.2 kg) BMI (Calculated): 38.26 Weight at Last Visit: 224lb Weight Lost Since Last Visit: 1lb Starting Weight: 234lb Total Weight Loss (lbs): 11 lb (4.99 kg)   Body Composition  Body Fat %: 46.7 % Fat Mass (lbs): 104.6 lbs Muscle Mass (lbs): 113.2 lbs Total Body Water (lbs): 85.2 lbs Visceral Fat Rating : 12   Other Clinical Data Fasting: yes Labs: no Today's Visit #: 4 Starting Date: 11/18/22    OBESITY Nela is here to discuss her progress with her obesity treatment plan along with follow-up of her obesity related diagnoses.     Nutrition Plan: the Category 2 plan - 70% adherence.  Current exercise:  Gym for 45 minutes a day and at least   2 days a week   Interim History:  She had a " stomach virus" last week but is now back on track now.  She is up over 2 pounds per the bioimpedance scale from her last visit. Is not skipping meals  Hunger is moderately controlled.  Cravings are moderately controlled.  Assessment/Plan:  1. Vitamin D Deficiency Vitamin D is not at goal of 50.  Most recent vitamin D level was 28.6. She is on  prescription cholecalciferol 50,000 IU weekly. Lab Results  Component Value Date   VD25OH 28.6 (L) 11/18/2022   VD25OH 39.09 09/27/2021    Plan: Continue prescription vitamin D 50,000 IU weekly.,  Prediabetes Last A1c was 5.8  Medication(s): None. Had considered a GLP-1 but her insurance will not currently pay for that medication.  We discussed perhaps starting Metformin and an information sheet was given.  Lab Results  Component Value Date   HGBA1C 5.8 (H) 11/18/2022   HGBA1C 5.3 09/27/2021   HGBA1C 5.4 06/14/2020   HGBA1C 5.5 05/05/2019   HGBA1C 5.5 03/02/2018   Lab Results  Component Value  Date   INSULIN 27.7 (H) 11/18/2022    Plan: Will minimize all refined carbohydrates both sweets and starches.  Will work on the plan and exercise.  Consider both aerobic and resistance training.  Will keep protein, water, and fiber intake high.  Increase Polyunsaturated and Monounsaturated fats to increase satiety and encourage weight loss.  Aim for 7 to 9 hours of sleep nightly.    Generalized Obesity: Current BMI 38 Will continue the plan and exercise.   Kiomara is currently in the action stage of change. As such, her goal is to continue with weight loss efforts.  She has agreed to the Category 2 plan.  Exercise goals: For substantial health benefits, adults should do at least 150 minutes (2 hours and 30 minutes) a week of moderate-intensity, or 75 minutes (1 hour and 15 minutes) a week of vigorous-intensity aerobic physical activity, or an equivalent combination of moderate- and vigorous-intensity aerobic activity. Aerobic activity should be performed in episodes of at least 10 minutes, and preferably, it should be spread throughout the week.  Behavioral modification strategies: better snacking choices, avoiding temptations, and pack lunch for work.  Lashaya has agreed to follow-up with our clinic in 2 weeks.       Objective:   VITALS: Per patient if applicable, see vitals. GENERAL: Alert and in no acute distress. CARDIOPULMONARY: No increased WOB. Speaking in clear sentences.  PSYCH: Pleasant and cooperative. Speech  normal rate and rhythm. Affect is appropriate. Insight and judgement are appropriate. Attention is focused, linear, and appropriate.  NEURO: Oriented as arrived to appointment on time with no prompting.   Attestation Statements:   This was prepared with the assistance of Presenter, broadcasting.  Occasional wrong-word or sound-a-like substitutions may have occurred due to the inherent limitations of voice recognition software.   Jearld Lesch, DO

## 2023-01-08 NOTE — Progress Notes (Signed)
Chief Complaint:   OBESITY Stephanie Long is here to discuss her progress with her obesity treatment plan along with follow-up of her obesity related diagnoses. Stephanie Long is on the Category 2 plan and states she is following her eating plan approximately 80% of the time. Stephanie Long states she has not been exercising.  Today's visit was #: 3 Starting weight: 234 lbs Starting date: 11/18/22 Today's weight: 224 lbs Today's date: 12/24/22 Total lbs lost to date: 10 Total lbs lost since last in-office visit: -7  Interim History: She is down an additional 7 pounds since her last visit.  Subjective:   1. Vitamin D deficiency Taking as directed.  2. Essential hypertension Taking amlodipine, HCTZ.  Controlled.  Assessment/Plan:   1. Vitamin D deficiency Refill- - Vitamin D, Ergocalciferol, (DRISDOL) 1.25 MG (50000 UNIT) CAPS capsule; Take 1 capsule (50,000 Units total) by mouth every 7 (seven) days.  Dispense: 5 capsule; Refill: 0  2. Essential hypertension 1.  Continue medications. 2.  No added salt.  3. Generalized obesity BMI 38.0-38.9,adult 1.  Meal planning. 2.  Intentional eating. 3.  Will adhere to the plan 80-90%. 4.  Keep fiber high. 5.  Will bring snacks to work.  Stephanie Long is currently in the action stage of change. As such, her goal is to continue with weight loss efforts. She has agreed to the Category 2 plan.  Exercise goals: Will start back to the gym.  Behavioral modification strategies: increasing lean protein intake, decreasing simple carbohydrates, increasing vegetables, increasing water intake, decreasing eating out, no skipping meals, meal planning and cooking strategies, keeping healthy foods in the home, and planning for success.  Stephanie Long has agreed to follow-up with our clinic in 2 weeks with [facility]. She was informed of the importance of frequent follow-up visits to maximize her success with intensive lifestyle modifications for her multiple health  conditions.    Objective:   Blood pressure 120/77, pulse (!) 58, temperature 97.7 F (36.5 C), height '5\' 4"'$  (1.626 m), weight 224 lb (101.6 kg), SpO2 97 %. Body mass index is 38.45 kg/m.  General: Cooperative, alert, well developed, in no acute distress. HEENT: Conjunctivae and lids unremarkable. Cardiovascular: Regular rhythm.  Lungs: Normal work of breathing. Neurologic: No focal deficits.   Lab Results  Component Value Date   CREATININE 0.85 11/18/2022   BUN 13 11/18/2022   NA 142 11/18/2022   K 3.9 11/18/2022   CL 101 11/18/2022   CO2 25 11/18/2022   Lab Results  Component Value Date   ALT 10 11/18/2022   AST 11 11/18/2022   ALKPHOS 104 11/18/2022   BILITOT 0.5 11/18/2022   Lab Results  Component Value Date   HGBA1C 5.8 (H) 11/18/2022   HGBA1C 5.3 09/27/2021   HGBA1C 5.4 06/14/2020   HGBA1C 5.5 05/05/2019   HGBA1C 5.5 03/02/2018   Lab Results  Component Value Date   INSULIN 27.7 (H) 11/18/2022   Lab Results  Component Value Date   TSH 1.970 11/18/2022   Lab Results  Component Value Date   CHOL 138 11/18/2022   HDL 46 11/18/2022   LDLCALC 77 11/18/2022   TRIG 77 11/18/2022   CHOLHDL 3 09/27/2021   Lab Results  Component Value Date   VD25OH 28.6 (L) 11/18/2022   VD25OH 39.09 09/27/2021   Lab Results  Component Value Date   WBC 9.5 09/27/2021   HGB 13.8 09/27/2021   HCT 41.1 09/27/2021   MCV 88.6 09/27/2021   PLT 232.0 09/27/2021   No  results found for: "IRON", "TIBC", "FERRITIN"  Attestation Statements:   Reviewed by clinician on day of visit: allergies, medications, problem list, medical history, surgical history, family history, social history, and previous encounter notes.  I, Dawn Whitmire, FNP-C, am acting as transcriptionist for Dr. Jearld Lesch.  I have reviewed the above documentation for accuracy and completeness, and I agree with the above. Jearld Lesch, DO

## 2023-01-09 ENCOUNTER — Ambulatory Visit: Payer: 59 | Admitting: Nurse Practitioner

## 2023-01-29 ENCOUNTER — Other Ambulatory Visit (HOSPITAL_COMMUNITY): Payer: Self-pay

## 2023-01-30 ENCOUNTER — Other Ambulatory Visit: Payer: Self-pay

## 2023-01-30 ENCOUNTER — Encounter: Payer: Self-pay | Admitting: Bariatrics

## 2023-01-30 ENCOUNTER — Ambulatory Visit (INDEPENDENT_AMBULATORY_CARE_PROVIDER_SITE_OTHER): Payer: 59 | Admitting: Bariatrics

## 2023-01-30 ENCOUNTER — Other Ambulatory Visit (HOSPITAL_COMMUNITY): Payer: Self-pay

## 2023-01-30 VITALS — BP 117/78 | HR 52 | Temp 97.5°F | Ht 64.0 in | Wt 222.0 lb

## 2023-01-30 DIAGNOSIS — R7303 Prediabetes: Secondary | ICD-10-CM | POA: Diagnosis not present

## 2023-01-30 DIAGNOSIS — Z6838 Body mass index (BMI) 38.0-38.9, adult: Secondary | ICD-10-CM

## 2023-01-30 DIAGNOSIS — E669 Obesity, unspecified: Secondary | ICD-10-CM

## 2023-01-30 DIAGNOSIS — E559 Vitamin D deficiency, unspecified: Secondary | ICD-10-CM | POA: Diagnosis not present

## 2023-01-30 MED ORDER — VITAMIN D (ERGOCALCIFEROL) 1.25 MG (50000 UNIT) PO CAPS
50000.0000 [IU] | ORAL_CAPSULE | ORAL | 0 refills | Status: DC
  Filled 2023-01-30: qty 5, 35d supply, fill #0

## 2023-01-30 MED ORDER — METFORMIN HCL 500 MG PO TABS
500.0000 mg | ORAL_TABLET | Freq: Every day | ORAL | 0 refills | Status: DC
  Filled 2023-01-30: qty 30, 30d supply, fill #0

## 2023-01-30 NOTE — Progress Notes (Signed)
WEIGHT SUMMARY AND BIOMETRICS  Weight Lost Since Last Visit: 1lb   Vitals Temp: (!) 97.5 F (36.4 C) BP: 117/78 Pulse Rate: (!) 52 SpO2: 97 %   Anthropometric Measurements Height: 5\' 4"  (1.626 m) Weight: 222 lb (100.7 kg) BMI (Calculated): 38.09 Weight at Last Visit: 223lb Weight Lost Since Last Visit: 1lb Weight Gained Since Last Visit: 0 Starting Weight: 234lb Total Weight Loss (lbs): 12 lb (5.443 kg)   Body Composition  Body Fat %: 46 % Fat Mass (lbs): 102.2 lbs Muscle Mass (lbs): 114 lbs Total Body Water (lbs): 85.6 lbs Visceral Fat Rating : 12   Other Clinical Data Fasting: no Labs: no Today's Visit #: 5 Starting Date: 11/18/22    OBESITY Stephanie Long is here to discuss her progress with her obesity treatment plan along with follow-up of her obesity related diagnoses.     Nutrition Plan: the Category 2 plan - 50% adherence.  Current exercise: none  Interim History:  She is down 1 lb. She has been on vacation.  Protein intake is less than prescribed., Is not skipping meals, and Water intake is inadequate.   Hunger is moderately controlled.  Cravings are moderately controlled.  Assessment/Plan:   1. Vitamin D deficiency  Vitamin D is not at goal of 50.  Most recent vitamin D level was 28.6. She is on  prescription ergocalciferol 50,000 IU weekly. Lab Results  Component Value Date   VD25OH 28.6 (L) 11/18/2022   VD25OH 39.09 09/27/2021    Plan: Refill prescription vitamin D 50,000 IU weekly.    Prediabetes Last A1c was 5.8  Medication(s): none None Lab Results  Component Value Date   HGBA1C 5.8 (H) 11/18/2022   HGBA1C 5.3 09/27/2021   HGBA1C 5.4 06/14/2020   HGBA1C 5.5 05/05/2019   HGBA1C 5.5 03/02/2018   Lab Results  Component Value Date   INSULIN 27.7 (H) 11/18/2022    Plan: Will minimize all refined carbohydrates both sweets and starches.  Will work on the plan and exercise.  Consider both aerobic and resistance  training.  Will keep protein, water, and fiber intake high.  Increase Polyunsaturated and Monounsaturated fats to increase satiety and encourage weight loss.  Aim for 7 to 9 hours of sleep nightly.  Will continue medications.   Start Metformin 500 mg 1 daily with lunch #30 with 0 refills      Generalized Obesity: Current BMI BMI (Calculated): 38.09   Pharmacotherapy Plan  Begin Metformin. Discussed side effects, risks, and benefits.    Stephanie Long is currently in the action stage of change. As such, her goal is to continue with weight loss efforts.  She has agreed to the Category 2 plan.  Exercise goals: For substantial health benefits, adults should do at least 150 minutes (2 hours and 30 minutes) a week of moderate-intensity, or 75 minutes (1 hour and 15 minutes) a week of vigorous-intensity aerobic physical activity, or an equivalent combination of moderate- and vigorous-intensity aerobic activity. Aerobic activity should be performed in episodes of at least 10 minutes, and preferably, it should be spread throughout the week.  Behavioral modification strategies: decreasing simple carbohydrates  and planning for success.  Stephanie Long has agreed to follow-up with our clinic in 3 weeks.       Objective:   VITALS: Per patient if applicable, see vitals. GENERAL: Alert and in no acute distress. CARDIOPULMONARY: No increased WOB. Speaking in clear sentences.  PSYCH: Pleasant and cooperative. Speech normal rate and rhythm. Affect is appropriate. Insight and judgement  are appropriate. Attention is focused, linear, and appropriate.  NEURO: Oriented as arrived to appointment on time with no prompting.   Attestation Statements:   This was prepared with the assistance of Presenter, broadcasting.  Occasional wrong-word or sound-a-like substitutions may have occurred due to the inherent limitations of voice recognition software.

## 2023-02-03 ENCOUNTER — Other Ambulatory Visit: Payer: Self-pay

## 2023-02-03 ENCOUNTER — Other Ambulatory Visit (HOSPITAL_COMMUNITY): Payer: Self-pay

## 2023-02-12 NOTE — Progress Notes (Signed)
TeleHealth Visit:  This visit was completed with telemedicine (audio/video) technology. Stephanie Long has verbally consented to this TeleHealth visit. The patient is located at home, the provider is located at home. The participants in this visit include the listed provider and patient. The visit was conducted today via MyChart video.  OBESITY Stephanie Long is here to discuss her progress with her obesity treatment plan along with follow-up of her obesity related diagnoses.   Today's visit was # 6 Starting weight: 234 lbs Starting date: 11/18/22 Weight at last in office visit: 222 lbs on 01/30/23 Total weight loss: 12 lbs at last in office visit on 01/30/23. Today's reported weight (02/13/23): none reported  Nutrition Plan: the Category 2 plan - 85-90% adherence.  Current exercise:  Walks in neighborhood, dance class, worked out a Hotel manager.   Interim History:  She feels like she is back on track.  She had a lot going on over the past month or so which was a barrier to adhering well to plan-new job, spring break. Has family membership at [employer].  She has gone to [employer] once to work out recently.  She is working on getting into a routine with exercise since she started her new job which is Monday through Friday. She eats out 2-3 weekly but makes good choices. Packs lunch for work or gets a healthy option from the hospital cafeteria.  Eating all of the prescribed protein: yes  Skipping meals: No Drinking adequate water: Yes Drinking sugar sweetened beverages: No Hunger controlled: well controlled.  Assessment/Plan:  1. Prediabetes Last A1c was 5.8 on 11/18/22.  Medication(s): Metformin 500 mg daily with lunch started last office visit.  Tolerating well. Lab Results  Component Value Date   HGBA1C 5.8 (H) 11/18/2022   HGBA1C 5.3 09/27/2021   HGBA1C 5.4 06/14/2020   HGBA1C 5.5 05/05/2019   HGBA1C 5.5 03/02/2018   Lab Results  Component Value Date   INSULIN 27.7 (H) 11/18/2022     Plan: Continue Metformin 500 mg once daily lunch.   2. Vitamin D Deficiency Vitamin D is not at goal of 50.  Most recent vitamin D level was 28.6 on 11/18/2022. She is on  prescription ergocalciferol 50,000 IU weekly. Lab Results  Component Value Date   VD25OH 28.6 (L) 11/18/2022   VD25OH 39.09 09/27/2021    Plan: Continue and refill  prescription ergocalciferol 50,000 IU weekly   3. Morbid Obesity: Current BMI 38  [name] is currently in the action stage of change. As such, her goal is to continue with weight loss efforts.  She has agreed to the Category 2 plan.  Exercise goals: Work on finding a good exercise routine.  Encouraged 150 minutes of cardio weekly.  Behavioral modification strategies: increasing lean protein intake, decreasing simple carbohydrates , meal planning , and planning for success.  Stephanie Long has agreed to follow-up with our clinic in 3 weeks.   No orders of the defined types were placed in this encounter.   Medications Discontinued During This Encounter  Medication Reason   Vitamin D, Ergocalciferol, (DRISDOL) 1.25 MG (50000 UNIT) CAPS capsule Reorder     Meds ordered this encounter  Medications   Vitamin D, Ergocalciferol, (DRISDOL) 1.25 MG (50000 UNIT) CAPS capsule    Sig: Take 1 capsule (50,000 Units total) by mouth every 7 (seven) days.    Dispense:  5 capsule    Refill:  0    Order Specific Question:   Supervising Provider    Answer:   Carolin Sicks  Objective:   VITALS: Per patient if applicable, see vitals. GENERAL: Alert and in no acute distress. CARDIOPULMONARY: No increased WOB. Speaking in clear sentences.  PSYCH: Pleasant and cooperative. Speech normal rate and rhythm. Affect is appropriate. Insight and judgement are appropriate. Attention is focused, linear, and appropriate.  NEURO: Oriented as arrived to appointment on time with no prompting.   Attestation Statements:   Reviewed by clinician on day of visit:  allergies, medications, problem list, medical history, surgical history, family history, social history, and previous encounter notes.  This was prepared with the assistance of Engineer, civil (consulting).  Occasional wrong-word or sound-a-like substitutions may have occurred due to the inherent limitations of voice recognition software.

## 2023-02-13 ENCOUNTER — Other Ambulatory Visit (HOSPITAL_COMMUNITY): Payer: Self-pay

## 2023-02-13 ENCOUNTER — Telehealth (INDEPENDENT_AMBULATORY_CARE_PROVIDER_SITE_OTHER): Payer: 59 | Admitting: Family Medicine

## 2023-02-13 ENCOUNTER — Encounter (INDEPENDENT_AMBULATORY_CARE_PROVIDER_SITE_OTHER): Payer: Self-pay | Admitting: Family Medicine

## 2023-02-13 ENCOUNTER — Other Ambulatory Visit: Payer: Self-pay

## 2023-02-13 ENCOUNTER — Ambulatory Visit: Payer: 59 | Admitting: Nurse Practitioner

## 2023-02-13 DIAGNOSIS — E559 Vitamin D deficiency, unspecified: Secondary | ICD-10-CM

## 2023-02-13 DIAGNOSIS — Z6838 Body mass index (BMI) 38.0-38.9, adult: Secondary | ICD-10-CM

## 2023-02-13 DIAGNOSIS — R7303 Prediabetes: Secondary | ICD-10-CM | POA: Diagnosis not present

## 2023-02-13 MED ORDER — VITAMIN D (ERGOCALCIFEROL) 1.25 MG (50000 UNIT) PO CAPS
50000.0000 [IU] | ORAL_CAPSULE | ORAL | 0 refills | Status: DC
  Filled 2023-02-13: qty 5, 35d supply, fill #0

## 2023-02-25 ENCOUNTER — Other Ambulatory Visit (HOSPITAL_COMMUNITY): Payer: Self-pay

## 2023-02-26 ENCOUNTER — Other Ambulatory Visit (HOSPITAL_COMMUNITY): Payer: Self-pay

## 2023-02-26 ENCOUNTER — Other Ambulatory Visit: Payer: Self-pay

## 2023-02-26 ENCOUNTER — Other Ambulatory Visit: Payer: Self-pay | Admitting: Pharmacist

## 2023-02-26 MED ORDER — DUPIXENT 300 MG/2ML ~~LOC~~ SOAJ: SUBCUTANEOUS | 3 refills | Status: DC

## 2023-02-26 MED ORDER — DUPIXENT 300 MG/2ML ~~LOC~~ SOAJ
SUBCUTANEOUS | 3 refills | Status: DC
  Filled 2023-02-26: qty 4, 28d supply, fill #0
  Filled 2023-04-01: qty 4, 28d supply, fill #1
  Filled 2023-05-07: qty 4, 28d supply, fill #2
  Filled 2023-06-06: qty 4, 28d supply, fill #3

## 2023-03-06 ENCOUNTER — Ambulatory Visit (INDEPENDENT_AMBULATORY_CARE_PROVIDER_SITE_OTHER): Payer: 59 | Admitting: Nurse Practitioner

## 2023-03-06 ENCOUNTER — Other Ambulatory Visit (HOSPITAL_COMMUNITY): Payer: Self-pay

## 2023-03-06 ENCOUNTER — Other Ambulatory Visit: Payer: Self-pay

## 2023-03-06 ENCOUNTER — Encounter: Payer: Self-pay | Admitting: Nurse Practitioner

## 2023-03-06 VITALS — BP 122/78 | HR 56 | Temp 98.2°F | Ht 64.0 in | Wt 219.0 lb

## 2023-03-06 DIAGNOSIS — E559 Vitamin D deficiency, unspecified: Secondary | ICD-10-CM

## 2023-03-06 DIAGNOSIS — Z6837 Body mass index (BMI) 37.0-37.9, adult: Secondary | ICD-10-CM

## 2023-03-06 DIAGNOSIS — E669 Obesity, unspecified: Secondary | ICD-10-CM

## 2023-03-06 DIAGNOSIS — R7303 Prediabetes: Secondary | ICD-10-CM | POA: Diagnosis not present

## 2023-03-06 MED ORDER — VITAMIN D (ERGOCALCIFEROL) 1.25 MG (50000 UNIT) PO CAPS
50000.0000 [IU] | ORAL_CAPSULE | ORAL | 0 refills | Status: DC
Start: 2023-03-06 — End: 2023-04-10
  Filled 2023-03-06 – 2023-03-13 (×2): qty 5, 35d supply, fill #0

## 2023-03-06 MED ORDER — METFORMIN HCL 500 MG PO TABS
500.0000 mg | ORAL_TABLET | Freq: Two times a day (BID) | ORAL | 0 refills | Status: DC
Start: 2023-03-06 — End: 2023-03-30
  Filled 2023-03-06: qty 60, 30d supply, fill #0

## 2023-03-06 NOTE — Progress Notes (Signed)
Office: [phone]  /  Fax: [phone]  WEIGHT SUMMARY AND BIOMETRICS  Weight Lost Since Last Visit: 3lb  No data recorded  Vitals Temp: 98.2 F (36.8 C) BP: 122/78 Pulse Rate: (!) 56 SpO2: 97 %   Anthropometric Measurements Height: 5\' 4"  (1.626 m) Weight: 219 lb (99.3 kg) BMI (Calculated): 37.57 Weight at Last Visit: 222lb Weight Lost Since Last Visit: 3lb Starting Weight: 234lb Total Weight Loss (lbs): 15 lb (6.804 kg)   Body Composition  Body Fat %: 45.8 % Fat Mass (lbs): 100.6 lbs Muscle Mass (lbs): 113.2 lbs Total Body Water (lbs): 85.6 lbs Visceral Fat Rating : 12   Other Clinical Data Fasting: No Labs: No Today's Visit #: 6 Starting Date: 11/18/22     HPI  Chief Complaint: OBESITY  Stephanie Long is here to discuss her progress with her obesity treatment plan. She is on the the Category 2 Plan and states she is following her eating plan approximately 75-80 % of the time. She states she is exercising 30-60 minutes 2-3 days per week.   Interval History:  Since last office visit she has lost 3 lbs.  She is struggling but is overall doing well. She is aiming to eat more protein.  She is drinking protein shakes, teas, fairlife milk and water.   She started working out [employer], gym-cardio and core, walking and online work outs.    Pharmacotherapy for weight loss: She is not currently taking medications  for medical weight loss.    Previous pharmacotherapy for medical weight loss: Metformin  Bariatric surgery:  Patient has not had bariatric surgery   Prediabetes Last A1c was 5.8  Medication(s): Metformin 500mg  daily.  Denies side effects.  Notes polyphagia and cravings.  Can't tell if Metformin in working.     Lab Results  Component Value Date   HGBA1C 5.8 (H) 11/18/2022   HGBA1C 5.3 09/27/2021   HGBA1C 5.4 06/14/2020   HGBA1C 5.5 05/05/2019   HGBA1C 5.5 03/02/2018   Lab Results  Component Value Date   INSULIN 27.7 (H) 11/18/2022     PHYSICAL EXAM:  Blood pressure 122/78, pulse (!) 56, temperature 98.2 F (36.8 C), height 5\' 4"  (1.626 m), weight 219 lb (99.3 kg), SpO2 97 %. Body mass index is 37.59 kg/m.  General: She is overweight, cooperative, alert, well developed, and in no acute distress. PSYCH: Has normal mood, affect and thought process.   Extremities: No edema.  Neurologic: No gross sensory or motor deficits. No tremors or fasciculations noted.    DIAGNOSTIC DATA REVIEWED:  BMET    Component Value Date/Time   NA 142 11/18/2022 1153   K 3.9 11/18/2022 1153   CL 101 11/18/2022 1153   CO2 25 11/18/2022 1153   GLUCOSE 90 11/18/2022 1153   GLUCOSE 80 09/27/2021 1458   BUN 13 11/18/2022 1153   CREATININE 0.85 11/18/2022 1153   CALCIUM 9.3 11/18/2022 1153   GFRNONAA >60 06/01/2018 0955   GFRAA >60 06/01/2018 0955   Lab Results  Component Value Date   HGBA1C 5.8 (H) 11/18/2022   HGBA1C 5.5 03/02/2018   Lab Results  Component Value Date   INSULIN 27.7 (H) 11/18/2022   Lab Results  Component Value Date   TSH 1.970 11/18/2022   CBC    Component Value Date/Time   WBC 9.5 09/27/2021 1458   RBC 4.64 09/27/2021 1458   HGB 13.8 09/27/2021 1458   HCT 41.1 09/27/2021 1458   PLT 232.0 09/27/2021 1458   MCV 88.6  09/27/2021 1458   MCH 27.8 06/01/2018 0955   MCHC 33.6 09/27/2021 1458   RDW 12.9 09/27/2021 1458   Iron Studies No results found for: "IRON", "TIBC", "FERRITIN", "IRONPCTSAT" Lipid Panel     Component Value Date/Time   CHOL 138 11/18/2022 1153   TRIG 77 11/18/2022 1153   HDL 46 11/18/2022 1153   CHOLHDL 3 09/27/2021 1458   VLDL 15.2 09/27/2021 1458   LDLCALC 77 11/18/2022 1153   Hepatic Function Panel     Component Value Date/Time   PROT 6.8 11/18/2022 1153   ALBUMIN 4.2 11/18/2022 1153   AST 11 11/18/2022 1153   ALT 10 11/18/2022 1153   ALKPHOS 104 11/18/2022 1153   BILITOT 0.5 11/18/2022 1153      Component Value Date/Time   TSH 1.970 11/18/2022 1153    Nutritional Lab Results  Component Value Date   VD25OH 28.6 (L) 11/18/2022   VD25OH 39.09 09/27/2021     ASSESSMENT AND PLAN  TREATMENT PLAN FOR OBESITY:  Recommended Dietary Goals  Zaina is currently in the action stage of change. As such, her goal is to continue weight management plan. She has agreed to the Category 2 Plan.  Behavioral Intervention  We discussed the following Behavioral Modification Strategies today: increasing lean protein intake, decreasing simple carbohydrates , increasing vegetables, increasing lower glycemic fruits, increasing fiber rich foods, avoiding skipping meals, increasing water intake, continue to practice mindfulness when eating, and planning for success.  Additional resources provided today: NA  Recommended Physical Activity Goals  Deloria has been advised to work up to 150 minutes of moderate intensity aerobic activity a week and strengthening exercises 2-3 times per week for cardiovascular health, weight loss maintenance and preservation of muscle mass.   She has agreed to Continue current level of physical activity    ASSOCIATED CONDITIONS ADDRESSED TODAY  Action/Plan  Prediabetes -     Increase metFORMIN HCl; Take 1 tablet (500 mg total) by mouth 2 (two) times daily with a meal.  Dispense: 60 tablet; Refill: 0.  Side effects  Ainhoa will continue to work on weight loss, exercise, and decreasing simple carbohydrates to help decrease the risk of diabetes.    Vitamin D deficiency -     Vitamin D (Ergocalciferol); Take 1 capsule (50,000 Units total) by mouth every 7 (seven) days.  Dispense: 5 capsule; Refill: 0.  Side effects discussed  Low Vitamin D level contributes to fatigue and are associated with obesity, breast, and colon cancer. She agrees to continue to take prescription Vitamin D @50 ,000 IU every week and will follow-up for routine testing of Vitamin D, at least 2-3 times per year to avoid over-replacement.    Generalized obesity  BMI 37.0-37.9, adult         Return in about 4 weeks (around 04/03/2023).Marland Kitchen She was informed of the importance of frequent follow up visits to maximize her success with intensive lifestyle modifications for her multiple health conditions.   ATTESTASTION STATEMENTS:  Reviewed by clinician on day of visit: allergies, medications, problem list, medical history, surgical history, family history, social history, and previous encounter notes.    Theodis Sato. Wrangler Penning FNP-C

## 2023-03-07 ENCOUNTER — Other Ambulatory Visit (HOSPITAL_COMMUNITY): Payer: Self-pay

## 2023-03-07 ENCOUNTER — Other Ambulatory Visit: Payer: Self-pay

## 2023-03-11 ENCOUNTER — Encounter: Payer: Self-pay | Admitting: Family Medicine

## 2023-03-11 ENCOUNTER — Other Ambulatory Visit (HOSPITAL_COMMUNITY): Payer: Self-pay

## 2023-03-12 ENCOUNTER — Ambulatory Visit (INDEPENDENT_AMBULATORY_CARE_PROVIDER_SITE_OTHER): Payer: 59 | Admitting: Family Medicine

## 2023-03-12 VITALS — BP 114/78 | HR 58 | Temp 97.9°F | Resp 18 | Ht 64.0 in | Wt 223.0 lb

## 2023-03-12 DIAGNOSIS — H1045 Other chronic allergic conjunctivitis: Secondary | ICD-10-CM

## 2023-03-12 NOTE — Patient Instructions (Addendum)
Appt to see Dr Lorin Picket at [facility] tomorrow at 9:30 In the meantime try to minimize use of Naphcon drops which may be causing some rebound redness  Let me know what Dr Lorin Picket says about using Dupixent going forward- I hope you are able to continue it!

## 2023-03-12 NOTE — Progress Notes (Signed)
[facility] at [facility] [address] [place], [place] [address] [phone]  Date:  03/12/2023   Name:  Stephanie Long   DOB:  13-Jul-1976   MRN:  [id number]  PCP:  Pearline Cables, MD    Chief Complaint: eye discomfort (Pt c/o eye sensitivity right more than the left- she is on Dupixent. She has tried: Naphcon A almost every 4 hours, Pataday)   History of Present Illness:  Stephanie Long is a 47 y.o. very pleasant female patient who presents with the following:  Most recent visit with myself was in February for her physical History of allergies and mild asthma, Planter fasciitis, overweight, elevated blood pressure taking HCTZ, prediabete  Patient seen today with concern of eye irritation.  She has noticed this for 2.5 weeks or so, worse the last week or so The right eye is worse  Her eye feels "scratchy" like there is "something stuck in my eyes" No significant discharge or matting  She uses glasses but not contacts- no real change in her vision except she may notice some eye watering  No photophobia She has been using Napchon A anti-redness drops frequently over the last 2 weeks.  She just added Pataday as well   At her last visit she has started Dupixent for her eczema which was helping a lot, especially with dyshidrotic eczema on her hands- she started using this 6 months or so ago This is dosed biweekly  She is [facility]  She is seen at [facility] Elson Areas Readings from Last 3 Encounters:  03/12/23 223 lb (101.2 kg)  03/06/23 219 lb (99.3 kg)  01/30/23 222 lb (100.7 kg)   dupilumab is commonly associated with cases of conjunctivitis and allergic conjunctivitis, eye pruritus, blepharitis, and dry eye and with infrequent cases of keratitis and ulcerative keratitis, especially in patients with atopic dermatitis  Patient Active Problem List    Diagnosis Date Noted   Morbid obesity: Starting BMI 40.1 02/13/2023   BMI 38.0-38.9,adult 12/04/2022   Prediabetes 11/19/2022   Insulin resistance 11/19/2022   SOB (shortness of breath) on exertion 11/18/2022   Health care maintenance 11/18/2022   Vitamin D deficiency 11/18/2022   B12 deficiency 11/18/2022   Other fatigue 11/05/2022   Gestational diabetes mellitus (GDM), antepartum 11/05/2022   Complex tear of medial meniscus of right knee 10/11/2021   Essential hypertension 09/27/2021   Right knee pain 10/09/2017   Right foot pain 10/09/2017   Plantar fasciitis of right foot 10/16/2016   Right shoulder pain 10/16/2016   Generalized obesity 10/07/2016   DERMATITIS, OTHER ATOPIC 03/24/2007   Mild persistent asthma without complication 03/10/2007    Past Medical History:  Diagnosis Date   Allergy    Anxiety    Asthma    Back pain    Depression    GERD (gastroesophageal reflux disease)    Gestational diabetes    Gestational hypertension 05/09/2013   Hx of varicella    Joint pain    Osteoarthritis    PCOS (polycystic ovarian syndrome) 2012   Plantar fasciitis    Symptomatic cholelithiasis     Past Surgical History:  Procedure Laterality Date   CESAREAN SECTION N/A 05/09/2013   Procedure: primary cesarean section with delivery of baby girl at 1800.  Apgars 9/9.;  Surgeon: Lenoard Aden, MD;  Location: [facility];  Service: Obstetrics;  Laterality:  N/A;   CHOLECYSTECTOMY N/A 06/08/2018   Procedure: LAPAROSCOPIC CHOLECYSTECTOMY;  Surgeon: Violeta Gelinas, MD;  Location: [facility];  Service: General;  Laterality: N/A;   KNEE ARTHROSCOPY Right 10/11/2021   Procedure: RIGHT KNEE ARTHROSCOPY WITH PARTIAL MEDIAL MENISCECTOMY;  Surgeon: Kathryne Hitch, MD;  Location: [facility];  Service: Orthopedics;  Laterality: Right;   WISDOM TOOTH EXTRACTION     WISDOM TOOTH EXTRACTION      Social History   Tobacco Use   Smoking status: Never   Smokeless tobacco: Never   Vaping Use   Vaping Use: Never used  Substance Use Topics   Alcohol use: Yes    Alcohol/week: 2.0 standard drinks of alcohol    Types: 1 Glasses of wine, 1 Standard drinks or equivalent per week   Drug use: No    Family History  Problem Relation Age of Onset   Depression Mother    Hypertension Mother    Diabetes Father    Heart disease Father    Hyperlipidemia Father    Heart attack Father    Hypertension Father    Stroke Maternal Grandmother    Cancer Neg Hx    Asthma Neg Hx    Early death Neg Hx     No Known Allergies  Medication list has been reviewed and updated.  Current Outpatient Medications on File Prior to Visit  Medication Sig Dispense Refill   Acetaminophen (TYLENOL PO) Take 1 tablet by mouth as needed.     amLODipine (NORVASC) 2.5 MG tablet Take 1 tablet (2.5 mg total) by mouth daily. 90 tablet 3   ASHWAGANDHA PO Take by mouth.     calcium carbonate (TUMS - DOSED IN MG ELEMENTAL CALCIUM) 500 MG chewable tablet Chew 1 tablet by mouth daily as needed for indigestion or heartburn.     clobetasol cream (TEMOVATE) 0.05 % Apply at night to eczema up to 4-6 weeks as needed. Not safe for long term use. 60 g 1   DUPIXENT 300 MG/2ML SOPN inject one pen every two weeks 4 mL 3   fluticasone (FLOVENT HFA) 110 MCG/ACT inhaler Inhale 1 puff into the lungs in the morning and at bedtime. 12 g 12   Glucosamine 500 MG CAPS Take 1,000 mg by mouth daily.     hydrochlorothiazide (HYDRODIURIL) 25 MG tablet Take 1 tablet (25 mg total) by mouth daily. 90 tablet 3   IBUPROFEN PO Take 1 tablet by mouth as needed.     MAGNESIUM CITRATE PO Take by mouth.     Melatonin 5 MG TABS Take by mouth.     metFORMIN (GLUCOPHAGE) 500 MG tablet Take 1 tablet (500 mg total) by mouth 2 (two) times daily with a meal. 60 tablet 0   Multiple Vitamin (MULTIVITAMIN) tablet Take 1 tablet by mouth daily.     triamcinolone ointment (KENALOG) 0.5 % Apply topically 2 times daily as needed for eczema on hands 45  g 2   VENTOLIN HFA 108 (90 Base) MCG/ACT inhaler INHALE 2 PUFFS INTO THE LUNGS EVERY 6 HOURS AS NEEDED FOR WHEEZING OR SHORTNESS OF BREATH. 18 g 6   VITAMIN D PO Take by mouth.     Vitamin D, Ergocalciferol, (DRISDOL) 1.25 MG (50000 UNIT) CAPS capsule Take 1 capsule (50,000 Units total) by mouth every 7 (seven) days. 5 capsule 0   No current facility-administered medications on file prior to visit.    Review of Systems:  As per HPI- otherwise negative.   Physical Examination: Vitals:  03/12/23 0946  BP: 114/78  Pulse: (!) 58  Resp: 18  Temp: 97.9 F (36.6 C)  SpO2: 99%   Vitals:   03/12/23 0946  Weight: 223 lb (101.2 kg)  Height: 5\' 4"  (1.626 m)   Body mass index is 38.28 kg/m. Ideal Body Weight: Weight in (lb) to have BMI = 25: 145.3  GEN: no acute distress.  Obese, looks well  HEENT: Atraumatic, Normocephalic.   PEERL, EOMI Ears and Nose: No external deformity. CV: RRR, No M/G/R. No JVD. No thrill. No extra heart sounds. PULM: CTA B, no wheezes, crackles, rhonchi. No retractions. No resp. distress. No accessory muscle use. EXTR: No c/c/e PSYCH: Normally interactive. Conversant.  Mild conjunctival and scleral redness of both eyes is seen. More pronounced on the right.  Consistent with conjunctivitis  Normal limited funduscopic exam  Assessment and Plan: Other chronic allergic conjunctivitis of both eyes Pt seen today with eye redness consistent with a conjunctivitis, this may be related to excessive use of antiredness drops but also consider side effect of her Dupixent for eczema Called her optometrist who is able to see her tomorrow am. Enouraged pt to stop or minimize use of Napchon A drops in the meantime.  Advised patient we would like consult her optometrist about the advisability of continuing Depixent Her next shot will be due this weekend-prescribed per her gynecologist Signed Abbe Amsterdam, MD

## 2023-03-13 ENCOUNTER — Other Ambulatory Visit (HOSPITAL_COMMUNITY): Payer: Self-pay

## 2023-03-13 MED ORDER — PREDNISOLONE ACETATE 1 % OP SUSP
1.0000 [drp] | OPHTHALMIC | 0 refills | Status: DC
  Filled 2023-03-13: qty 5, 7d supply, fill #0

## 2023-03-30 ENCOUNTER — Other Ambulatory Visit: Payer: Self-pay | Admitting: Nurse Practitioner

## 2023-03-30 DIAGNOSIS — R7303 Prediabetes: Secondary | ICD-10-CM

## 2023-03-31 ENCOUNTER — Other Ambulatory Visit (HOSPITAL_COMMUNITY): Payer: Self-pay

## 2023-03-31 ENCOUNTER — Other Ambulatory Visit: Payer: Self-pay

## 2023-03-31 MED ORDER — METFORMIN HCL 500 MG PO TABS
500.0000 mg | ORAL_TABLET | Freq: Two times a day (BID) | ORAL | 0 refills | Status: DC
Start: 2023-03-31 — End: 2023-04-10
  Filled 2023-03-31: qty 60, 30d supply, fill #0

## 2023-04-01 ENCOUNTER — Other Ambulatory Visit (HOSPITAL_COMMUNITY): Payer: Self-pay

## 2023-04-07 ENCOUNTER — Other Ambulatory Visit (HOSPITAL_COMMUNITY): Payer: Self-pay

## 2023-04-10 ENCOUNTER — Other Ambulatory Visit (HOSPITAL_COMMUNITY): Payer: Self-pay

## 2023-04-10 ENCOUNTER — Ambulatory Visit: Payer: 59 | Admitting: Nurse Practitioner

## 2023-04-10 ENCOUNTER — Encounter: Payer: Self-pay | Admitting: Nurse Practitioner

## 2023-04-10 ENCOUNTER — Other Ambulatory Visit: Payer: Self-pay

## 2023-04-10 VITALS — BP 122/78 | HR 57 | Temp 98.3°F | Ht 64.0 in | Wt 217.0 lb

## 2023-04-10 DIAGNOSIS — Z6837 Body mass index (BMI) 37.0-37.9, adult: Secondary | ICD-10-CM | POA: Diagnosis not present

## 2023-04-10 DIAGNOSIS — E559 Vitamin D deficiency, unspecified: Secondary | ICD-10-CM

## 2023-04-10 DIAGNOSIS — E669 Obesity, unspecified: Secondary | ICD-10-CM | POA: Diagnosis not present

## 2023-04-10 DIAGNOSIS — Z79899 Other long term (current) drug therapy: Secondary | ICD-10-CM

## 2023-04-10 DIAGNOSIS — R7303 Prediabetes: Secondary | ICD-10-CM | POA: Diagnosis not present

## 2023-04-10 MED ORDER — VITAMIN D (ERGOCALCIFEROL) 1.25 MG (50000 UNIT) PO CAPS
50000.0000 [IU] | ORAL_CAPSULE | ORAL | 0 refills | Status: DC
Start: 2023-04-10 — End: 2023-06-18
  Filled 2023-04-10: qty 5, 35d supply, fill #0

## 2023-04-10 MED ORDER — METFORMIN HCL 500 MG PO TABS
500.0000 mg | ORAL_TABLET | Freq: Two times a day (BID) | ORAL | 0 refills | Status: DC
Start: 2023-04-10 — End: 2023-05-08
  Filled 2023-04-10: qty 60, 30d supply, fill #0

## 2023-04-10 NOTE — Progress Notes (Signed)
Office: [phone]  /  Fax: [phone]  WEIGHT SUMMARY AND BIOMETRICS  Weight Lost Since Last Visit: 2lb  No data recorded  Vitals Temp: 98.3 F (36.8 C) BP: 122/78 Pulse Rate: (!) 57 SpO2: 96 %   Anthropometric Measurements Height: 5\' 4"  (1.626 m) Weight: 217 lb (98.4 kg) BMI (Calculated): 37.23 Weight at Last Visit: 219lb Weight Lost Since Last Visit: 2lb Starting Weight: 234lb Total Weight Loss (lbs): 17 lb (7.711 kg)   Body Composition  Body Fat %: 44.3 % Fat Mass (lbs): 96.2 lbs Muscle Mass (lbs): 114.8 lbs Total Body Water (lbs): 84 lbs Visceral Fat Rating : 11   Other Clinical Data Fasting: No Labs: No Today's Visit #: 7 Starting Date: 11/18/22     HPI  Chief Complaint: OBESITY  Stephanie Long is here to discuss her progress with her obesity treatment plan. She is on the the Category 2 Plan and states she is following her eating plan approximately 75-80 % of the time. She states she is exercising 0 minutes 0 days per week.   Interval History:  Since last office visit she has lost 2 pounds.  She is aiming to eat more protein with each meal.  She is drinking water daily. She hasn't been able to exercise.  Her clothes are fitting better.  Her highest weight was 255lbs.    Pharmacotherapy for weight loss: She is not currently taking medications  for medical weight loss.    Previous pharmacotherapy for medical weight loss:  Metfomrin  Bariatric surgery:  Has not had bariatric surgery  Prediabetes Last A1c was 5.8  Medication(s): Metformin 500mg  BID. Increased after her last visit. Denies side effects. Has helped with hunger and cravings and would like to continue taking Metformin BID.  Polyphagia:Yes and cravings.    Lab Results  Component Value Date   HGBA1C 5.8 (H) 11/18/2022   HGBA1C 5.3 09/27/2021   HGBA1C 5.4 06/14/2020   HGBA1C 5.5 05/05/2019   HGBA1C 5.5 03/02/2018   Lab Results  Component Value Date   INSULIN 27.7 (H) 11/18/2022     Vit D deficiency  She is taking Vit D 50,000 IU weekly.  Denies side effects.  Denies nausea, vomiting or muscle weakness.    Lab Results  Component Value Date   VD25OH 28.6 (L) 11/18/2022   VD25OH 39.09 09/27/2021     PHYSICAL EXAM:  Blood pressure 122/78, pulse (!) 57, temperature 98.3 F (36.8 C), height 5\' 4"  (1.626 m), weight 217 lb (98.4 kg), SpO2 96 %. Body mass index is 37.25 kg/m.  General: She is overweight, cooperative, alert, well developed, and in no acute distress. PSYCH: Has normal mood, affect and thought process.   Extremities: No edema.  Neurologic: No gross sensory or motor deficits. No tremors or fasciculations noted.    DIAGNOSTIC DATA REVIEWED:  BMET    Component Value Date/Time   NA 142 11/18/2022 1153   K 3.9 11/18/2022 1153   CL 101 11/18/2022 1153   CO2 25 11/18/2022 1153   GLUCOSE 90 11/18/2022 1153   GLUCOSE 80 09/27/2021 1458   BUN 13 11/18/2022 1153   CREATININE 0.85 11/18/2022 1153   CALCIUM 9.3 11/18/2022 1153   GFRNONAA >60 06/01/2018 0955   GFRAA >60 06/01/2018 0955   Lab Results  Component Value Date   HGBA1C 5.8 (H) 11/18/2022   HGBA1C 5.5 03/02/2018   Lab Results  Component Value Date   INSULIN 27.7 (H) 11/18/2022   Lab Results  Component Value Date  TSH 1.970 11/18/2022   CBC    Component Value Date/Time   WBC 9.5 09/27/2021 1458   RBC 4.64 09/27/2021 1458   HGB 13.8 09/27/2021 1458   HCT 41.1 09/27/2021 1458   PLT 232.0 09/27/2021 1458   MCV 88.6 09/27/2021 1458   MCH 27.8 06/01/2018 0955   MCHC 33.6 09/27/2021 1458   RDW 12.9 09/27/2021 1458   Iron Studies No results found for: "IRON", "TIBC", "FERRITIN", "IRONPCTSAT" Lipid Panel     Component Value Date/Time   CHOL 138 11/18/2022 1153   TRIG 77 11/18/2022 1153   HDL 46 11/18/2022 1153   CHOLHDL 3 09/27/2021 1458   VLDL 15.2 09/27/2021 1458   LDLCALC 77 11/18/2022 1153   Hepatic Function Panel     Component Value Date/Time   PROT 6.8  11/18/2022 1153   ALBUMIN 4.2 11/18/2022 1153   AST 11 11/18/2022 1153   ALT 10 11/18/2022 1153   ALKPHOS 104 11/18/2022 1153   BILITOT 0.5 11/18/2022 1153      Component Value Date/Time   TSH 1.970 11/18/2022 1153   Nutritional Lab Results  Component Value Date   VD25OH 28.6 (L) 11/18/2022   VD25OH 39.09 09/27/2021     ASSESSMENT AND PLAN  TREATMENT PLAN FOR OBESITY:  Recommended Dietary Goals  Stephanie Long is currently in the action stage of change. As such, her goal is to continue weight management plan. She has agreed to the Category 2 Plan.  Behavioral Intervention  We discussed the following Behavioral Modification Strategies today: increasing lean protein intake, decreasing simple carbohydrates , increasing vegetables, increasing lower glycemic fruits, increasing water intake, work on meal planning and preparation, keeping healthy foods at home, continue to practice mindfulness when eating, and planning for success.  Additional resources provided today: NA  Recommended Physical Activity Goals  Stephanie Long has been advised to work up to 150 minutes of moderate intensity aerobic activity a week and strengthening exercises 2-3 times per week for cardiovascular health, weight loss maintenance and preservation of muscle mass.   She has agreed to Think about ways to increase daily physical activity and overcoming barriers to exercise, Increase physical activity in their day and reduce sedentary time (increase NEAT)., and Work on scheduling and tracking physical activity.    Pharmacotherapy She is not interested in starting a weight loss medication at this time.  ASSOCIATED CONDITIONS ADDRESSED TODAY  Action/Plan  Prediabetes -     Continue metFORMIN HCl; Take 1 tablet (500 mg total) by mouth 2 (two) times daily with a meal.  Dispense: 60 tablet; Refill: 0 -     Comprehensive metabolic panel -     Hemoglobin A1c -     Insulin, random -     CBC with  Differential/Platelet  Vitamin D deficiency -     Vitamin D (Ergocalciferol); Take 1 capsule (50,000 Units total) by mouth every 7 (seven) days.  Dispense: 5 capsule; Refill: 0 -     Comprehensive metabolic panel -     VITAMIN D 25 Hydroxy (Vit-D Deficiency, Fractures) -     CBC with Differential/Platelet  Medication management -     Comprehensive metabolic panel -     CBC with Differential/Platelet  Generalized obesity -     CBC with Differential/Platelet  BMI 37.0-37.9, adult -     CBC with Differential/Platelet         Return in about 4 weeks (around 05/08/2023).Marland Kitchen She was informed of the importance of frequent follow up visits to maximize  her success with intensive lifestyle modifications for her multiple health conditions.   ATTESTASTION STATEMENTS:  Reviewed by clinician on day of visit: allergies, medications, problem list, medical history, surgical history, family history, social history, and previous encounter notes.     Theodis Sato. Stephanie Collet FNP-C

## 2023-04-11 LAB — CBC WITH DIFFERENTIAL/PLATELET
Basophils Absolute: 0.1 10*3/uL (ref 0.0–0.2)
Basos: 1 %
EOS (ABSOLUTE): 0.1 10*3/uL (ref 0.0–0.4)
Eos: 2 %
Hematocrit: 42.3 % (ref 34.0–46.6)
Hemoglobin: 14.2 g/dL (ref 11.1–15.9)
Immature Grans (Abs): 0 10*3/uL (ref 0.0–0.1)
Immature Granulocytes: 0 %
Lymphocytes Absolute: 1.6 10*3/uL (ref 0.7–3.1)
Lymphs: 25 %
MCH: 29.8 pg (ref 26.6–33.0)
MCHC: 33.6 g/dL (ref 31.5–35.7)
MCV: 89 fL (ref 79–97)
Monocytes Absolute: 0.4 10*3/uL (ref 0.1–0.9)
Monocytes: 6 %
Neutrophils Absolute: 4.4 10*3/uL (ref 1.4–7.0)
Neutrophils: 66 %
Platelets: 262 10*3/uL (ref 150–450)
RBC: 4.77 x10E6/uL (ref 3.77–5.28)
RDW: 13.2 % (ref 11.7–15.4)
WBC: 6.6 10*3/uL (ref 3.4–10.8)

## 2023-04-11 LAB — VITAMIN D 25 HYDROXY (VIT D DEFICIENCY, FRACTURES): Vit D, 25-Hydroxy: 76.6 ng/mL (ref 30.0–100.0)

## 2023-04-11 LAB — HEMOGLOBIN A1C
Est. average glucose Bld gHb Est-mCnc: 111 mg/dL
Hgb A1c MFr Bld: 5.5 % (ref 4.8–5.6)

## 2023-04-11 LAB — COMPREHENSIVE METABOLIC PANEL
ALT: 13 IU/L (ref 0–32)
AST: 16 IU/L (ref 0–40)
Albumin/Globulin Ratio: 1.6
Albumin: 4.2 g/dL (ref 3.9–4.9)
Alkaline Phosphatase: 95 IU/L (ref 44–121)
BUN/Creatinine Ratio: 16 (ref 9–23)
BUN: 13 mg/dL (ref 6–24)
Bilirubin Total: 0.5 mg/dL (ref 0.0–1.2)
CO2: 27 mmol/L (ref 20–29)
Calcium: 9.2 mg/dL (ref 8.7–10.2)
Chloride: 102 mmol/L (ref 96–106)
Creatinine, Ser: 0.79 mg/dL (ref 0.57–1.00)
Globulin, Total: 2.6 g/dL (ref 1.5–4.5)
Glucose: 92 mg/dL (ref 70–99)
Potassium: 4 mmol/L (ref 3.5–5.2)
Sodium: 143 mmol/L (ref 134–144)
Total Protein: 6.8 g/dL (ref 6.0–8.5)
eGFR: 95 mL/min/{1.73_m2} (ref >59)

## 2023-04-11 LAB — INSULIN, RANDOM: INSULIN: 22 u[IU]/mL (ref 2.6–24.9)

## 2023-04-17 ENCOUNTER — Other Ambulatory Visit (HOSPITAL_BASED_OUTPATIENT_CLINIC_OR_DEPARTMENT_OTHER): Payer: Self-pay

## 2023-04-17 ENCOUNTER — Telehealth: Payer: 59 | Admitting: Physician Assistant

## 2023-04-17 DIAGNOSIS — J020 Streptococcal pharyngitis: Secondary | ICD-10-CM | POA: Diagnosis not present

## 2023-04-17 MED ORDER — AMOXICILLIN 500 MG PO CAPS
500.0000 mg | ORAL_CAPSULE | Freq: Two times a day (BID) | ORAL | 0 refills | Status: DC
Start: 2023-04-17 — End: 2023-04-17
  Filled 2023-04-17: qty 20, 10d supply, fill #0

## 2023-04-17 MED ORDER — AMOXICILLIN 500 MG PO CAPS
500.0000 mg | ORAL_CAPSULE | Freq: Two times a day (BID) | ORAL | 0 refills | Status: AC
Start: 2023-04-17 — End: 2023-04-27
  Filled 2023-04-17 (×3): qty 20, 10d supply, fill #0

## 2023-04-17 NOTE — Addendum Note (Signed)
Addended by: Margaretann Loveless on: 04/17/2023 09:40 AM   Modules accepted: Orders

## 2023-04-17 NOTE — Progress Notes (Signed)

## 2023-04-25 ENCOUNTER — Other Ambulatory Visit: Payer: Self-pay

## 2023-04-29 ENCOUNTER — Other Ambulatory Visit (HOSPITAL_COMMUNITY): Payer: Self-pay

## 2023-05-02 ENCOUNTER — Other Ambulatory Visit (HOSPITAL_COMMUNITY): Payer: Self-pay

## 2023-05-05 ENCOUNTER — Encounter (HOSPITAL_COMMUNITY): Payer: Self-pay

## 2023-05-05 ENCOUNTER — Other Ambulatory Visit (HOSPITAL_COMMUNITY): Payer: Self-pay

## 2023-05-06 ENCOUNTER — Other Ambulatory Visit: Payer: Self-pay

## 2023-05-07 ENCOUNTER — Other Ambulatory Visit (HOSPITAL_COMMUNITY): Payer: Self-pay

## 2023-05-07 ENCOUNTER — Other Ambulatory Visit: Payer: Self-pay | Admitting: Oncology

## 2023-05-07 DIAGNOSIS — Z006 Encounter for examination for normal comparison and control in clinical research program: Secondary | ICD-10-CM

## 2023-05-08 ENCOUNTER — Encounter: Payer: Self-pay | Admitting: Nurse Practitioner

## 2023-05-08 ENCOUNTER — Ambulatory Visit: Payer: 59 | Admitting: Nurse Practitioner

## 2023-05-08 ENCOUNTER — Other Ambulatory Visit: Payer: Self-pay

## 2023-05-08 ENCOUNTER — Telehealth: Payer: Self-pay

## 2023-05-08 VITALS — BP 127/82 | HR 68 | Temp 98.1°F | Ht 64.0 in | Wt 220.0 lb

## 2023-05-08 DIAGNOSIS — Z6837 Body mass index (BMI) 37.0-37.9, adult: Secondary | ICD-10-CM

## 2023-05-08 DIAGNOSIS — R7303 Prediabetes: Secondary | ICD-10-CM | POA: Diagnosis not present

## 2023-05-08 DIAGNOSIS — E669 Obesity, unspecified: Secondary | ICD-10-CM | POA: Diagnosis not present

## 2023-05-08 DIAGNOSIS — E559 Vitamin D deficiency, unspecified: Secondary | ICD-10-CM | POA: Diagnosis not present

## 2023-05-08 MED ORDER — CONTRAVE 8-90 MG PO TB12
ORAL_TABLET | ORAL | 0 refills | Status: DC
Start: 2023-05-08 — End: 2023-06-18
  Filled 2023-05-08: qty 78, 30d supply, fill #0

## 2023-05-08 MED ORDER — METFORMIN HCL 500 MG PO TABS
500.0000 mg | ORAL_TABLET | Freq: Two times a day (BID) | ORAL | 0 refills | Status: DC
Start: 2023-05-08 — End: 2023-06-18
  Filled 2023-05-08: qty 60, 30d supply, fill #0

## 2023-05-08 NOTE — Patient Instructions (Signed)

## 2023-05-08 NOTE — Progress Notes (Signed)
Office: [phone]  /  Fax: [phone]  WEIGHT SUMMARY AND BIOMETRICS  Weight Lost Since Last Visit: 0lb  Weight Gained Since Last Visit: 3lb   Vitals Temp: 98.1 F (36.7 C) BP: 127/82 Pulse Rate: 68 SpO2: 96 %   Anthropometric Measurements Height: 5\' 4"  (1.626 m) Weight: 220 lb (99.8 kg) BMI (Calculated): 37.74 Weight at Last Visit: 217lb Weight Lost Since Last Visit: 0lb Weight Gained Since Last Visit: 3lb Starting Weight: 234lb Total Weight Loss (lbs): 14 lb (6.35 kg)   Body Composition  Body Fat %: 45.9 % Fat Mass (lbs): 101.2 lbs Muscle Mass (lbs): 113.4 lbs Total Body Water (lbs): 87.6 lbs Visceral Fat Rating : 12   Other Clinical Data Fasting: Yes Labs: No Today's Visit #: 8 Starting Date: 11/18/22     HPI  Chief Complaint: OBESITY  Stephanie Long is here to discuss her progress with her obesity treatment plan. She is on the the Category 2 Plan and states she is following her eating plan approximately 60 % of the time. She states she is exercising 0 minutes 0 days per week.   Interval History:  Since last office visit she has gained 3 pounds.  She went on vacation and has gotten off track since her last visit. She has been eating more fruit and salads.  She is drinking water, coffee, unsweetened tea and seltzer water.  Struggles with cravings.    Pharmacotherapy for weight loss: She is not currently taking medications  for medical weight loss.   Previous pharmacotherapy for medical weight loss:  Metformin  Bariatric surgery:  Patient has not had bariatric surgery.    Vit D deficiency  She is taking Vit D 50,000 IU weekly.  Denies side effects.  Denies nausea, vomiting or muscle weakness.    Lab Results  Component Value Date   VD25OH 76.6 04/10/2023   VD25OH 28.6 (L) 11/18/2022   VD25OH 39.09 09/27/2021    Prediabetes Last A1c was 5.5  Medication(s): Metformin 500mg  BID. Denies side effects Polyphagia:Yes Lab Results  Component Value  Date   HGBA1C 5.5 04/10/2023   HGBA1C 5.8 (H) 11/18/2022   HGBA1C 5.3 09/27/2021   HGBA1C 5.4 06/14/2020   HGBA1C 5.5 05/05/2019   Lab Results  Component Value Date   INSULIN 22.0 04/10/2023   INSULIN 27.7 (H) 11/18/2022    PHYSICAL EXAM:  Blood pressure 127/82, pulse 68, temperature 98.1 F (36.7 C), height 5\' 4"  (1.626 m), weight 220 lb (99.8 kg), SpO2 96%. Body mass index is 37.76 kg/m.  General: She is overweight, cooperative, alert, well developed, and in no acute distress. PSYCH: Has normal mood, affect and thought process.   Extremities: No edema.  Neurologic: No gross sensory or motor deficits. No tremors or fasciculations noted.    DIAGNOSTIC DATA REVIEWED:  BMET    Component Value Date/Time   NA 143 04/10/2023 0823   K 4.0 04/10/2023 0823   CL 102 04/10/2023 0823   CO2 27 04/10/2023 0823   GLUCOSE 92 04/10/2023 0823   GLUCOSE 80 09/27/2021 1458   BUN 13 04/10/2023 0823   CREATININE 0.79 04/10/2023 0823   CALCIUM 9.2 04/10/2023 0823   GFRNONAA >60 06/01/2018 0955   GFRAA >60 06/01/2018 0955   Lab Results  Component Value Date   HGBA1C 5.5 04/10/2023   HGBA1C 5.5 03/02/2018   Lab Results  Component Value Date   INSULIN 22.0 04/10/2023   INSULIN 27.7 (H) 11/18/2022   Lab Results  Component Value Date  TSH 1.970 11/18/2022   CBC    Component Value Date/Time   WBC 6.6 04/10/2023 0823   WBC 9.5 09/27/2021 1458   RBC 4.77 04/10/2023 0823   RBC 4.64 09/27/2021 1458   HGB 14.2 04/10/2023 0823   HCT 42.3 04/10/2023 0823   PLT 262 04/10/2023 0823   MCV 89 04/10/2023 0823   MCH 29.8 04/10/2023 0823   MCH 27.8 06/01/2018 0955   MCHC 33.6 04/10/2023 0823   MCHC 33.6 09/27/2021 1458   RDW 13.2 04/10/2023 0823   Iron Studies No results found for: "IRON", "TIBC", "FERRITIN", "IRONPCTSAT" Lipid Panel     Component Value Date/Time   CHOL 138 11/18/2022 1153   TRIG 77 11/18/2022 1153   HDL 46 11/18/2022 1153   CHOLHDL 3 09/27/2021 1458   VLDL  15.2 09/27/2021 1458   LDLCALC 77 11/18/2022 1153   Hepatic Function Panel     Component Value Date/Time   PROT 6.8 04/10/2023 0823   ALBUMIN 4.2 04/10/2023 0823   AST 16 04/10/2023 0823   ALT 13 04/10/2023 0823   ALKPHOS 95 04/10/2023 0823   BILITOT 0.5 04/10/2023 0823      Component Value Date/Time   TSH 1.970 11/18/2022 1153   Nutritional Lab Results  Component Value Date   VD25OH 76.6 04/10/2023   VD25OH 28.6 (L) 11/18/2022   VD25OH 39.09 09/27/2021     ASSESSMENT AND PLAN  TREATMENT PLAN FOR OBESITY:  Recommended Dietary Goals  Stephanie Long is currently in the action stage of change. As such, her goal is to continue weight management plan. She has agreed to keeping a food journal and adhering to recommended goals of 1200-1300 calories and 75+ protein.  Behavioral Intervention  We discussed the following Behavioral Modification Strategies today: increasing lean protein intake, decreasing simple carbohydrates , increasing vegetables, increasing lower glycemic fruits, increasing water intake, identifying sources and decreasing liquid calories, decreasing eating out or consumption of processed foods, and making healthy choices when eating convenient foods, avoiding temptations and identifying enticing environmental cues, continue to practice mindfulness when eating, planning for success, and better snacking choices.  Additional resources provided today: NA  Recommended Physical Activity Goals  Stephanie Long has been advised to work up to 150 minutes of moderate intensity aerobic activity a week and strengthening exercises 2-3 times per week for cardiovascular health, weight loss maintenance and preservation of muscle mass.   She has agreed to Think about ways to increase daily physical activity and overcoming barriers to exercise and Increase physical activity in their day and reduce sedentary time (increase NEAT).   Pharmacotherapy We discussed various medication options to  help Stephanie Long with her weight loss efforts and we both agreed to start Contrave.  Side effects discussed.  Patient has an IUD.   ASSOCIATED CONDITIONS ADDRESSED TODAY  Action/Plan  Prediabetes -     metFORMIN HCl; Take 1 tablet (500 mg total) by mouth 2 (two) times daily with a meal.  Dispense: 60 tablet; Refill: 0  Last A1c looked better.  Will continue to monitor.    Vitamin D deficiency To stop Vit D 50,000 weekly and start Vit D 2,000 OTC.  Will continue to monitor.    Generalized obesity -     Contrave; Start 1 tablet every morning for 7 days, then 1 tablet twice daily for 7 days, then 2 tablets every morning and one every evening and then 2 po BID  Dispense: 120 tablet; Refill: 0  BMI 37.0-37.9, adult -  Contrave; Start 1 tablet every morning for 7 days, then 1 tablet twice daily for 7 days, then 2 tablets every morning and one every evening and then 2 po BID  Dispense: 120 tablet; Refill: 0       Labs reviewed in chart with patient from 04/10/23  Return in about 4 weeks (around 06/05/2023).Marland Kitchen She was informed of the importance of frequent follow up visits to maximize her success with intensive lifestyle modifications for her multiple health conditions.   ATTESTASTION STATEMENTS:  Reviewed by clinician on day of visit: allergies, medications, problem list, medical history, surgical history, family history, social history, and previous encounter notes.     Theodis Sato. Antavious Spanos FNP-C

## 2023-05-08 NOTE — Telephone Encounter (Signed)
PA submitted through Cover My Meds for Contrave. Awaiting insurance determination. Key: [id number]

## 2023-05-09 ENCOUNTER — Other Ambulatory Visit (HOSPITAL_COMMUNITY): Payer: Self-pay

## 2023-05-12 ENCOUNTER — Other Ambulatory Visit (HOSPITAL_COMMUNITY): Payer: Self-pay

## 2023-05-12 NOTE — Telephone Encounter (Signed)
The request for Contrave has been approved. The authorization is effective from 06/05/2023 to 09/03/2023, as long as the member is enrolled in their current health plan.

## 2023-05-21 ENCOUNTER — Other Ambulatory Visit (HOSPITAL_COMMUNITY): Payer: Self-pay

## 2023-05-22 ENCOUNTER — Other Ambulatory Visit: Payer: Self-pay

## 2023-06-03 ENCOUNTER — Other Ambulatory Visit (HOSPITAL_COMMUNITY): Payer: Self-pay

## 2023-06-06 ENCOUNTER — Other Ambulatory Visit (HOSPITAL_COMMUNITY): Payer: Self-pay

## 2023-06-13 ENCOUNTER — Other Ambulatory Visit (HOSPITAL_COMMUNITY): Payer: Self-pay

## 2023-06-18 ENCOUNTER — Other Ambulatory Visit: Payer: Self-pay

## 2023-06-18 ENCOUNTER — Encounter: Payer: Self-pay | Admitting: Nurse Practitioner

## 2023-06-18 ENCOUNTER — Ambulatory Visit: Payer: 59 | Admitting: Nurse Practitioner

## 2023-06-18 VITALS — BP 127/86 | HR 65 | Temp 98.1°F | Ht 64.0 in | Wt 212.0 lb

## 2023-06-18 DIAGNOSIS — E669 Obesity, unspecified: Secondary | ICD-10-CM | POA: Diagnosis not present

## 2023-06-18 DIAGNOSIS — Z6836 Body mass index (BMI) 36.0-36.9, adult: Secondary | ICD-10-CM

## 2023-06-18 DIAGNOSIS — R7303 Prediabetes: Secondary | ICD-10-CM

## 2023-06-18 MED ORDER — CONTRAVE 8-90 MG PO TB12
2.0000 | ORAL_TABLET | Freq: Two times a day (BID) | ORAL | 0 refills | Status: DC
Start: 2023-06-18 — End: 2023-08-04
  Filled 2023-06-18: qty 120, 30d supply, fill #0

## 2023-06-18 MED ORDER — METFORMIN HCL 500 MG PO TABS
500.0000 mg | ORAL_TABLET | Freq: Two times a day (BID) | ORAL | 0 refills | Status: AC
Start: 2023-06-18 — End: ?
  Filled 2023-06-18: qty 60, 30d supply, fill #0

## 2023-06-18 NOTE — Progress Notes (Signed)
Office: [phone]  /  Fax: [phone]  WEIGHT SUMMARY AND BIOMETRICS  Weight Lost Since Last Visit: 8lb  Weight Gained Since Last Visit: 0lb   Vitals Temp: 98.1 F (36.7 C) BP: 127/86 Pulse Rate: 65 SpO2: 98 %   Anthropometric Measurements Height: 5\' 4"  (1.626 m) Weight: 212 lb (96.2 kg) BMI (Calculated): 36.37 Weight at Last Visit: 220lb Weight Lost Since Last Visit: 8lb Weight Gained Since Last Visit: 0lb Starting Weight: 234lb Total Weight Loss (lbs): 22 lb (9.979 kg)   Body Composition  Body Fat %: 44 % Fat Mass (lbs): 93.4 lbs Muscle Mass (lbs): 113 lbs Total Body Water (lbs): 83.6 lbs Visceral Fat Rating : 11   Other Clinical Data Fasting: Yes Labs: No Today's Visit #: 9 Starting Date: 11/18/22     HPI  Chief Complaint: OBESITY  Stephanie Long is here to discuss her progress with her obesity treatment plan. She is on the the Category 2 Plan and states she is following her eating plan approximately 85 % of the time. She states she is exercising 30 minutes 3-4 days per week-walk/job.   Interval History:  Since last office visit she has lost 8 pounds.  She is using [employer]di[employer] and is averaging around 1500 calories, 80-90 grams of protein and 130-180 carbs.  She is drinking water, coffee, unsweetened tea and seltzer water.  As an adult her highest weight was 255 lbs and nadir weight was 175 lbs.     Pharmacotherapy for weight loss: She is currently taking Contrave 2 am and 1 p for medical weight loss.  Denies side effects.  Has helped with some cravings and polyphagia.    Previous pharmacotherapy for medical weight loss:  Metformin  Bariatric surgery:  Patient has not had bariatric surgery  Prediabetes/PCOS Last A1c was 5.5  Medication(s): Metformin 500mg  BID Polyphagia:Yes Lab Results  Component Value Date   HGBA1C 5.5 04/10/2023   HGBA1C 5.8 (H) 11/18/2022   HGBA1C 5.3 09/27/2021   HGBA1C 5.4 06/14/2020   HGBA1C 5.5 05/05/2019   Lab  Results  Component Value Date   INSULIN 22.0 04/10/2023   INSULIN 27.7 (H) 11/18/2022    PHYSICAL EXAM:  Blood pressure 127/86, pulse 65, temperature 98.1 F (36.7 C), height 5\' 4"  (1.626 m), weight 212 lb (96.2 kg), SpO2 98%. Body mass index is 36.39 kg/m.  General: She is overweight, cooperative, alert, well developed, and in no acute distress. PSYCH: Has normal mood, affect and thought process.   Extremities: No edema.  Neurologic: No gross sensory or motor deficits. No tremors or fasciculations noted.    DIAGNOSTIC DATA REVIEWED:  BMET    Component Value Date/Time   NA 143 04/10/2023 0823   K 4.0 04/10/2023 0823   CL 102 04/10/2023 0823   CO2 27 04/10/2023 0823   GLUCOSE 92 04/10/2023 0823   GLUCOSE 80 09/27/2021 1458   BUN 13 04/10/2023 0823   CREATININE 0.79 04/10/2023 0823   CALCIUM 9.2 04/10/2023 0823   GFRNONAA >60 06/01/2018 0955   GFRAA >60 06/01/2018 0955   Lab Results  Component Value Date   HGBA1C 5.5 04/10/2023   HGBA1C 5.5 03/02/2018   Lab Results  Component Value Date   INSULIN 22.0 04/10/2023   INSULIN 27.7 (H) 11/18/2022   Lab Results  Component Value Date   TSH 1.970 11/18/2022   CBC    Component Value Date/Time   WBC 6.6 04/10/2023 0823   WBC 9.5 09/27/2021 1458   RBC 4.77 04/10/2023 0823  RBC 4.64 09/27/2021 1458   HGB 14.2 04/10/2023 0823   HCT 42.3 04/10/2023 0823   PLT 262 04/10/2023 0823   MCV 89 04/10/2023 0823   MCH 29.8 04/10/2023 0823   MCH 27.8 06/01/2018 0955   MCHC 33.6 04/10/2023 0823   MCHC 33.6 09/27/2021 1458   RDW 13.2 04/10/2023 0823   Iron Studies No results found for: "IRON", "TIBC", "FERRITIN", "IRONPCTSAT" Lipid Panel     Component Value Date/Time   CHOL 138 11/18/2022 1153   TRIG 77 11/18/2022 1153   HDL 46 11/18/2022 1153   CHOLHDL 3 09/27/2021 1458   VLDL 15.2 09/27/2021 1458   LDLCALC 77 11/18/2022 1153   Hepatic Function Panel     Component Value Date/Time   PROT 6.8 04/10/2023 0823    ALBUMIN 4.2 04/10/2023 0823   AST 16 04/10/2023 0823   ALT 13 04/10/2023 0823   ALKPHOS 95 04/10/2023 0823   BILITOT 0.5 04/10/2023 0823      Component Value Date/Time   TSH 1.970 11/18/2022 1153   Nutritional Lab Results  Component Value Date   VD25OH 76.6 04/10/2023   VD25OH 28.6 (L) 11/18/2022   VD25OH 39.09 09/27/2021     ASSESSMENT AND PLAN  TREATMENT PLAN FOR OBESITY:  Recommended Dietary Goals  Stephanie Long is currently in the action stage of change. As such, her goal is to continue weight management plan. She has agreed to the Category 2 Plan.  Behavioral Intervention  We discussed the following Behavioral Modification Strategies today: increasing lean protein intake, decreasing simple carbohydrates , increasing vegetables, increasing lower glycemic fruits, increasing water intake, reading food labels , keeping healthy foods at home, continue to practice mindfulness when eating, and planning for success.  Additional resources provided today: NA  Recommended Physical Activity Goals  Stephanie Long has been advised to work up to 150 minutes of moderate intensity aerobic activity a week and strengthening exercises 2-3 times per week for cardiovascular health, weight loss maintenance and preservation of muscle mass.   She has agreed to Continue current level of physical activity    Pharmacotherapy We discussed various medication options to help Stephanie Long with her weight loss efforts and we both agreed to continue Contrave 2 am and 1 po.  Side effects discussed.  ASSOCIATED CONDITIONS ADDRESSED TODAY  Action/Plan  Prediabetes -     Continue metFORMIN HCl; Take 1 tablet (500 mg total) by mouth 2 (two) times daily with a meal.  Dispense: 60 tablet; Refill: 0  Stephanie Long will continue to work on weight loss, exercise, and decreasing simple carbohydrates to help decrease the risk of diabetes.    Generalized obesity -     Contrave; Take 2 tablets by mouth 2 times daily   Dispense: 120 tablet; Refill: 0  BMI 36.0-36.9,adult -     Contrave; Take 2 tablets by mouth 2 times daily  Dispense: 120 tablet; Refill: 0         Return in about 4 weeks (around 07/16/2023).Marland Kitchen She was informed of the importance of frequent follow up visits to maximize her success with intensive lifestyle modifications for her multiple health conditions.   ATTESTASTION STATEMENTS:  Reviewed by clinician on day of visit: allergies, medications, problem list, medical history, surgical history, family history, social history, and previous encounter notes.     Stephanie Long. Falisha Osment FNP-C

## 2023-07-03 ENCOUNTER — Other Ambulatory Visit (HOSPITAL_COMMUNITY): Payer: Self-pay

## 2023-07-07 ENCOUNTER — Other Ambulatory Visit (HOSPITAL_COMMUNITY): Payer: Self-pay

## 2023-07-09 ENCOUNTER — Other Ambulatory Visit: Payer: Self-pay

## 2023-07-09 ENCOUNTER — Other Ambulatory Visit (HOSPITAL_COMMUNITY): Payer: Self-pay

## 2023-07-09 DIAGNOSIS — L301 Dyshidrosis [pompholyx]: Secondary | ICD-10-CM | POA: Diagnosis not present

## 2023-07-09 MED ORDER — DUPIXENT 300 MG/2ML ~~LOC~~ SOAJ: SUBCUTANEOUS | 10 refills | Status: DC

## 2023-07-10 ENCOUNTER — Other Ambulatory Visit: Payer: Self-pay | Admitting: Pharmacist

## 2023-07-10 ENCOUNTER — Other Ambulatory Visit (HOSPITAL_COMMUNITY): Payer: Self-pay

## 2023-07-10 MED ORDER — DUPIXENT 300 MG/2ML ~~LOC~~ SOAJ
SUBCUTANEOUS | 10 refills | Status: DC
  Filled 2023-07-10: qty 4, 28d supply, fill #0
  Filled 2023-08-12: qty 4, 28d supply, fill #1
  Filled 2023-09-17: qty 4, 28d supply, fill #2
  Filled 2023-10-16 – 2023-11-18 (×2): qty 4, 28d supply, fill #3
  Filled 2023-12-22: qty 4, 28d supply, fill #4
  Filled 2024-01-27: qty 4, 28d supply, fill #5
  Filled 2024-03-08: qty 4, 28d supply, fill #6
  Filled 2024-04-07: qty 4, 28d supply, fill #7
  Filled 2024-05-03 – 2024-05-11 (×2): qty 4, 28d supply, fill #8
  Filled 2024-06-03: qty 4, 28d supply, fill #9
  Filled 2024-06-29 (×2): qty 4, 28d supply, fill #10

## 2023-07-16 ENCOUNTER — Other Ambulatory Visit: Payer: Self-pay

## 2023-07-19 ENCOUNTER — Encounter (HOSPITAL_COMMUNITY): Payer: Self-pay

## 2023-07-28 ENCOUNTER — Other Ambulatory Visit: Payer: Self-pay | Admitting: Nurse Practitioner

## 2023-07-28 ENCOUNTER — Other Ambulatory Visit (HOSPITAL_COMMUNITY): Payer: Self-pay

## 2023-07-28 DIAGNOSIS — R7303 Prediabetes: Secondary | ICD-10-CM

## 2023-07-28 MED ORDER — METFORMIN HCL 500 MG PO TABS
500.0000 mg | ORAL_TABLET | Freq: Two times a day (BID) | ORAL | 0 refills | Status: DC
  Filled 2023-07-28: qty 60, 30d supply, fill #0

## 2023-08-04 ENCOUNTER — Other Ambulatory Visit: Payer: Self-pay

## 2023-08-04 ENCOUNTER — Ambulatory Visit: Payer: 59 | Admitting: Nurse Practitioner

## 2023-08-04 ENCOUNTER — Encounter: Payer: Self-pay | Admitting: Nurse Practitioner

## 2023-08-04 ENCOUNTER — Other Ambulatory Visit (HOSPITAL_COMMUNITY): Payer: Self-pay

## 2023-08-04 VITALS — BP 125/83 | HR 65 | Temp 98.2°F | Ht 64.0 in | Wt 204.0 lb

## 2023-08-04 DIAGNOSIS — Z6835 Body mass index (BMI) 35.0-35.9, adult: Secondary | ICD-10-CM

## 2023-08-04 DIAGNOSIS — R7303 Prediabetes: Secondary | ICD-10-CM | POA: Diagnosis not present

## 2023-08-04 DIAGNOSIS — E669 Obesity, unspecified: Secondary | ICD-10-CM | POA: Diagnosis not present

## 2023-08-04 MED ORDER — CONTRAVE 8-90 MG PO TB12
2.0000 | ORAL_TABLET | Freq: Two times a day (BID) | ORAL | 1 refills | Status: DC
Start: 2023-08-04 — End: 2023-09-24
  Filled 2023-08-04: qty 120, 30d supply, fill #0
  Filled 2023-09-06 – 2023-09-16 (×2): qty 120, 30d supply, fill #1

## 2023-08-04 MED ORDER — CONTRAVE 8-90 MG PO TB12
2.0000 | ORAL_TABLET | Freq: Two times a day (BID) | ORAL | 0 refills | Status: DC
Start: 2023-08-04 — End: 2023-08-04
  Filled 2023-08-04: qty 120, 30d supply, fill #0

## 2023-08-04 MED ORDER — METFORMIN HCL 500 MG PO TABS
500.0000 mg | ORAL_TABLET | Freq: Two times a day (BID) | ORAL | 0 refills | Status: DC
Start: 2023-08-04 — End: 2023-11-19
  Filled 2023-08-04 – 2023-09-06 (×2): qty 180, 90d supply, fill #0

## 2023-08-04 MED ORDER — METFORMIN HCL 500 MG PO TABS
500.0000 mg | ORAL_TABLET | Freq: Two times a day (BID) | ORAL | 0 refills | Status: DC
  Filled 2023-08-04: qty 60, 30d supply, fill #0

## 2023-08-04 NOTE — Progress Notes (Signed)
Office: [phone]  /  Fax: [phone]  WEIGHT SUMMARY AND BIOMETRICS  Weight Lost Since Last Visit: 8lb  Weight Gained Since Last Visit: 0lb   Vitals Temp: 98.2 F (36.8 C) BP: 125/83 Pulse Rate: 65 SpO2: 100 %   Anthropometric Measurements Height: 5\' 4"  (1.626 m) Weight: 204 lb (92.5 kg) BMI (Calculated): 35 Weight at Last Visit: 212lb Weight Lost Since Last Visit: 8lb Weight Gained Since Last Visit: 0lb Starting Weight: 234lb Total Weight Loss (lbs): 30 lb (13.6 kg)   Body Composition  Body Fat %: 41.6 % Fat Mass (lbs): 84.6 lbs Muscle Mass (lbs): 113.2 lbs Total Body Water (lbs): 80.8 lbs Visceral Fat Rating : 10   Other Clinical Data Fasting: Yes Labs: No Today's Visit #: 10 Starting Date: 11/18/22     HPI  Chief Complaint: OBESITY  Stephanie Long is here to discuss her progress with her obesity treatment plan. She is on the the Category 2 Plan and states she is following her eating plan approximately 60 % of the time. She states she is exercising 20-30 minutes 3-4 days per week.   Interval History:  Since last office visit on 06/18/23 she has lost 8 pounds.  She is not skipping meals, aiming to eat more protein and drinking more water.   As an adult her highest weight was 255 lbs and nadir weight was 175 lbs    Pharmacotherapy for weight loss: She is currently taking Contrave 2 am and 2 pm for medical weight loss (increased dose last week).  Denies side effects.  Notes some cravings especially in the mornings.    Previous pharmacotherapy for medical weight loss:  Metformin  Bariatric surgery:  Patient has not had bariatric surgery.   Prediabetes Last A1c was 5.5  Medication(s): Metformin 500mg  BID.  Denies side effects.  Polyphagia:Yes-doing better.  Lab Results  Component Value Date   HGBA1C 5.5 04/10/2023   HGBA1C 5.8 (H) 11/18/2022   HGBA1C 5.3 09/27/2021   HGBA1C 5.4 06/14/2020   HGBA1C 5.5 05/05/2019   Lab Results  Component  Value Date   INSULIN 22.0 04/10/2023   INSULIN 27.7 (H) 11/18/2022       PHYSICAL EXAM:  Blood pressure 125/83, pulse 65, temperature 98.2 F (36.8 C), height 5\' 4"  (1.626 m), weight 204 lb (92.5 kg), SpO2 100%. Body mass index is 35.02 kg/m.  General: She is overweight, cooperative, alert, well developed, and in no acute distress. PSYCH: Has normal mood, affect and thought process.   Extremities: No edema.  Neurologic: No gross sensory or motor deficits. No tremors or fasciculations noted.    DIAGNOSTIC DATA REVIEWED:  BMET    Component Value Date/Time   NA 143 04/10/2023 0823   K 4.0 04/10/2023 0823   CL 102 04/10/2023 0823   CO2 27 04/10/2023 0823   GLUCOSE 92 04/10/2023 0823   GLUCOSE 80 09/27/2021 1458   BUN 13 04/10/2023 0823   CREATININE 0.79 04/10/2023 0823   CALCIUM 9.2 04/10/2023 0823   GFRNONAA >60 06/01/2018 0955   GFRAA >60 06/01/2018 0955   Lab Results  Component Value Date   HGBA1C 5.5 04/10/2023   HGBA1C 5.5 03/02/2018   Lab Results  Component Value Date   INSULIN 22.0 04/10/2023   INSULIN 27.7 (H) 11/18/2022   Lab Results  Component Value Date   TSH 1.970 11/18/2022   CBC    Component Value Date/Time   WBC 6.6 04/10/2023 0823   WBC 9.5 09/27/2021 1458   RBC 4.77  04/10/2023 0823   RBC 4.64 09/27/2021 1458   HGB 14.2 04/10/2023 0823   HCT 42.3 04/10/2023 0823   PLT 262 04/10/2023 0823   MCV 89 04/10/2023 0823   MCH 29.8 04/10/2023 0823   MCH 27.8 06/01/2018 0955   MCHC 33.6 04/10/2023 0823   MCHC 33.6 09/27/2021 1458   RDW 13.2 04/10/2023 0823   Iron Studies No results found for: "IRON", "TIBC", "FERRITIN", "IRONPCTSAT" Lipid Panel     Component Value Date/Time   CHOL 138 11/18/2022 1153   TRIG 77 11/18/2022 1153   HDL 46 11/18/2022 1153   CHOLHDL 3 09/27/2021 1458   VLDL 15.2 09/27/2021 1458   LDLCALC 77 11/18/2022 1153   Hepatic Function Panel     Component Value Date/Time   PROT 6.8 04/10/2023 0823   ALBUMIN 4.2  04/10/2023 0823   AST 16 04/10/2023 0823   ALT 13 04/10/2023 0823   ALKPHOS 95 04/10/2023 0823   BILITOT 0.5 04/10/2023 0823      Component Value Date/Time   TSH 1.970 11/18/2022 1153   Nutritional Lab Results  Component Value Date   VD25OH 76.6 04/10/2023   VD25OH 28.6 (L) 11/18/2022   VD25OH 39.09 09/27/2021     ASSESSMENT AND PLAN  TREATMENT PLAN FOR OBESITY:  Recommended Dietary Goals  Stephanie Long is currently in the action stage of change. As such, her goal is to continue weight management plan. She has agreed to the Category 2 Plan.  Behavioral Intervention  We discussed the following Behavioral Modification Strategies today: increasing lean protein intake to established goals, decreasing simple carbohydrates , increasing vegetables, avoiding skipping meals, increasing water intake , work on meal planning and preparation, and continue to work on maintaining a reduced calorie state, getting the recommended amount of protein, incorporating whole foods, making healthy choices, staying well hydrated and practicing mindfulness when eating..  Additional resources provided today: NA  Recommended Physical Activity Goals  Stephanie Long has been advised to work up to 150 minutes of moderate intensity aerobic activity a week and strengthening exercises 2-3 times per week for cardiovascular health, weight loss maintenance and preservation of muscle mass.   She has agreed to Think about enjoyable ways to increase daily physical activity and overcoming barriers to exercise and Increase physical activity in their day and reduce sedentary time (increase NEAT).   Pharmacotherapy We discussed various medication options to help Stephanie Long with her weight loss efforts and we both agreed to continue Contrave 2 po BID.  Side effects discussed.    ASSOCIATED CONDITIONS ADDRESSED TODAY  Action/Plan  Prediabetes -     metFORMIN HCl; Take 1 tablet (500 mg total) by mouth 2 (two) times daily with a  meal.  Dispense: 180 tablet; Refill: 0  Generalized obesity -     Contrave; Take 2 tablets by mouth 2 (two) times daily.  Dispense: 120 tablet; Refill: 1  BMI 35.0-35.9,adult         Return in about 2 months (around 10/04/2023).Marland Kitchen She was informed of the importance of frequent follow up visits to maximize her success with intensive lifestyle modifications for her multiple health conditions.   ATTESTASTION STATEMENTS:  Reviewed by clinician on day of visit: allergies, medications, problem list, medical history, surgical history, family history, social history, and previous encounter notes.     Theodis Sato. Stephanie Pion FNP-C

## 2023-08-12 ENCOUNTER — Other Ambulatory Visit (HOSPITAL_COMMUNITY): Payer: Self-pay

## 2023-08-12 ENCOUNTER — Encounter (HOSPITAL_COMMUNITY): Payer: Self-pay

## 2023-08-12 NOTE — Progress Notes (Signed)
Specialty Pharmacy Ongoing Clinical Assessment Note  Stephanie Long is a 47 y.o. female who is being followed by the specialty pharmacy service for RxSp Atopic Dermatitis   Patient's specialty medication(s) reviewed today: Dupilumab   Missed doses in the last 4 weeks: 0 (Pt has not missed a dose, but did delay her last dose.  She retimed appropriately.)   Patient/Caregiver did not have any additional questions or concerns.   Therapeutic benefit summary: Patient is achieving benefit   Adverse events/side effects summary: No adverse events/side effects   Patient's therapy is appropriate to: Continue    Goals Addressed             This Visit's Progress    Minimize recurrence of flares       Patient is on track. Patient will maintain adherence.  Ms. Bogucki reports being well-controlled at this time.          Follow up:  6 months  Servando Snare Specialty Pharmacist

## 2023-08-12 NOTE — Progress Notes (Signed)
Specialty Pharmacy Refill Coordination Note  Stephanie Long is a 47 y.o. female contacted today regarding refills of specialty medication(s) Dupilumab   Patient requested Delivery   Delivery date: 08/21/23   Verified address: [address] [place] [address]   Medication will be filled on 08/20/23.

## 2023-08-20 ENCOUNTER — Other Ambulatory Visit (HOSPITAL_COMMUNITY): Payer: Self-pay

## 2023-08-20 ENCOUNTER — Other Ambulatory Visit: Payer: Self-pay

## 2023-09-04 ENCOUNTER — Other Ambulatory Visit (HOSPITAL_COMMUNITY): Payer: Self-pay

## 2023-09-05 ENCOUNTER — Telehealth: Payer: Self-pay | Admitting: Pharmacist

## 2023-09-05 NOTE — Telephone Encounter (Signed)
Called patient to schedule an appointment for the [employer] [facility]. I was unable to reach the patient so I left a HIPAA-compliant message requesting that the patient return my call.   Luke Van Ausdall, PharmD, BCACP, CPP Clinical Pharmacist [facility] [phone]  

## 2023-09-06 ENCOUNTER — Other Ambulatory Visit (HOSPITAL_COMMUNITY): Payer: Self-pay

## 2023-09-08 ENCOUNTER — Other Ambulatory Visit: Payer: Self-pay

## 2023-09-08 ENCOUNTER — Other Ambulatory Visit (HOSPITAL_COMMUNITY): Payer: Self-pay

## 2023-09-09 ENCOUNTER — Encounter: Payer: Self-pay | Admitting: Family Medicine

## 2023-09-09 ENCOUNTER — Other Ambulatory Visit (HOSPITAL_COMMUNITY): Payer: Self-pay

## 2023-09-09 MED ORDER — HYDROXYZINE PAMOATE 25 MG PO CAPS
25.0000 mg | ORAL_CAPSULE | Freq: Three times a day (TID) | ORAL | 0 refills | Status: AC | PRN
  Filled 2023-09-09: qty 30, 10d supply, fill #0

## 2023-09-09 NOTE — Addendum Note (Signed)
Addended by: Abbe Amsterdam C on: 09/09/2023 06:11 PM   Modules accepted: Orders

## 2023-09-10 ENCOUNTER — Other Ambulatory Visit: Payer: Self-pay

## 2023-09-10 ENCOUNTER — Other Ambulatory Visit (HOSPITAL_COMMUNITY): Payer: Self-pay

## 2023-09-12 ENCOUNTER — Other Ambulatory Visit: Payer: Self-pay

## 2023-09-15 ENCOUNTER — Other Ambulatory Visit: Payer: Self-pay

## 2023-09-17 ENCOUNTER — Other Ambulatory Visit: Payer: Self-pay

## 2023-09-17 ENCOUNTER — Other Ambulatory Visit (HOSPITAL_COMMUNITY): Payer: Self-pay

## 2023-09-17 NOTE — Progress Notes (Signed)
Specialty Pharmacy Refill Coordination Note  Stephanie Long is a 47 y.o. female who completed refill questionnaire today regarding refills of specialty medication(s) Dupilumab   Patient requested Delivery   Delivery date: 09/24/23  Verified address: [address] [place], [place] [address]   Medication will be filled on 09/23/23.   Patient states next injection is Monday 09/29/23, and requested delivery that day. Refrigerated items cannot be shipped over the weekend. Due to the holiday, medication will ship 11/26 for delivery 11/27. Left voice mail notifying patient of shipping change.

## 2023-09-22 ENCOUNTER — Other Ambulatory Visit (HOSPITAL_COMMUNITY): Payer: Self-pay

## 2023-09-23 ENCOUNTER — Other Ambulatory Visit: Payer: Self-pay

## 2023-09-23 ENCOUNTER — Telehealth: Payer: Self-pay

## 2023-09-23 ENCOUNTER — Other Ambulatory Visit (HOSPITAL_COMMUNITY): Payer: Self-pay

## 2023-09-23 NOTE — Telephone Encounter (Signed)
Per Cover My Meds:  The request has been approved. The authorization is effective from 09/22/2023 to 09/22/2024, as long as the member is enrolled in their current health plan. This has been approved for a max daily dosage of 4.0. A written notification letter will follow with additional details.

## 2023-09-23 NOTE — Telephone Encounter (Signed)
PA submitted through Cover My Meds for Contrave. Awaiting insurance determination. Key: [id number]

## 2023-09-24 ENCOUNTER — Ambulatory Visit: Payer: 59 | Admitting: Nurse Practitioner

## 2023-09-24 ENCOUNTER — Other Ambulatory Visit: Payer: Self-pay

## 2023-09-24 ENCOUNTER — Encounter: Payer: Self-pay | Admitting: Nurse Practitioner

## 2023-09-24 VITALS — BP 132/88 | HR 61 | Temp 98.1°F | Ht 64.0 in | Wt 206.0 lb

## 2023-09-24 DIAGNOSIS — Z6835 Body mass index (BMI) 35.0-35.9, adult: Secondary | ICD-10-CM | POA: Diagnosis not present

## 2023-09-24 DIAGNOSIS — E669 Obesity, unspecified: Secondary | ICD-10-CM | POA: Diagnosis not present

## 2023-09-24 DIAGNOSIS — R632 Polyphagia: Secondary | ICD-10-CM

## 2023-09-24 MED ORDER — CONTRAVE 8-90 MG PO TB12
2.0000 | ORAL_TABLET | Freq: Two times a day (BID) | ORAL | 0 refills | Status: DC
  Filled 2023-09-24: qty 120, 30d supply, fill #0

## 2023-09-24 NOTE — Progress Notes (Signed)
Office: [phone]  /  Fax: [phone]  WEIGHT SUMMARY AND BIOMETRICS  Weight Lost Since Last Visit: 0lb  Weight Gained Since Last Visit: 2lb   Vitals Temp: 98.1 F (36.7 C) BP: 132/88 Pulse Rate: 61 SpO2: 96 %   Anthropometric Measurements Height: 5\' 4"  (1.626 m) Weight: 206 lb (93.4 kg) BMI (Calculated): 35.34 Weight at Last Visit: 204lb Weight Lost Since Last Visit: 0lb Weight Gained Since Last Visit: 2lb Starting Weight: 234lb Total Weight Loss (lbs): 28 lb (12.7 kg)   Body Composition  Body Fat %: 43 % Fat Mass (lbs): 88.6 lbs Muscle Mass (lbs): 111.6 lbs Total Body Water (lbs): 82.4 lbs Visceral Fat Rating : 11   Other Clinical Data Fasting: Yes Labs: No Today's Visit #: 11 Starting Date: 11/18/22     HPI  Chief Complaint: OBESITY  Deria is here to discuss her progress with her obesity treatment plan. She is on the the Category 2 Plan and states she is following her eating plan approximately 50 % of the time. She states she is exercising 0 minutes 0 days per week.   Interval History:  Since last office visit she has gained 2 pounds.  She is struggling with stress eating, polyphagia and cravings.  She feels that the Contrave helps with appetite.  She is drinking water.  Denies sugary drinks.  She started going to the gym recently.    Her highest weight was 255 lbs.    Pharmacotherapy for weight loss: She is currently taking Contrave 2 am and 2 pm for medical weight loss.  Denies side effects.  Notes some cravings especially in the mornings.    The authorization is effective from 09/22/2023 to 09/22/2024    Previous pharmacotherapy for medical weight loss:  Metformin   Bariatric surgery:  Patient has not had bariatric surgery.        PHYSICAL EXAM:  Blood pressure 132/88, pulse 61, temperature 98.1 F (36.7 C), height 5\' 4"  (1.626 m), weight 206 lb (93.4 kg), SpO2 96%. Body mass index is 35.36 kg/m.  General: She is  overweight, cooperative, alert, well developed, and in no acute distress. PSYCH: Has normal mood, affect and thought process.   Extremities: No edema.  Neurologic: No gross sensory or motor deficits. No tremors or fasciculations noted.    DIAGNOSTIC DATA REVIEWED:  BMET    Component Value Date/Time   NA 143 04/10/2023 0823   K 4.0 04/10/2023 0823   CL 102 04/10/2023 0823   CO2 27 04/10/2023 0823   GLUCOSE 92 04/10/2023 0823   GLUCOSE 80 09/27/2021 1458   BUN 13 04/10/2023 0823   CREATININE 0.79 04/10/2023 0823   CALCIUM 9.2 04/10/2023 0823   GFRNONAA >60 06/01/2018 0955   GFRAA >60 06/01/2018 0955   Lab Results  Component Value Date   HGBA1C 5.5 04/10/2023   HGBA1C 5.5 03/02/2018   Lab Results  Component Value Date   INSULIN 22.0 04/10/2023   INSULIN 27.7 (H) 11/18/2022   Lab Results  Component Value Date   TSH 1.970 11/18/2022   CBC    Component Value Date/Time   WBC 6.6 04/10/2023 0823   WBC 9.5 09/27/2021 1458   RBC 4.77 04/10/2023 0823   RBC 4.64 09/27/2021 1458   HGB 14.2 04/10/2023 0823   HCT 42.3 04/10/2023 0823   PLT 262 04/10/2023 0823   MCV 89 04/10/2023 0823   MCH 29.8 04/10/2023 0823   MCH 27.8 06/01/2018 0955   MCHC 33.6 04/10/2023 0823  MCHC 33.6 09/27/2021 1458   RDW 13.2 04/10/2023 0823   Iron Studies No results found for: "IRON", "TIBC", "FERRITIN", "IRONPCTSAT" Lipid Panel     Component Value Date/Time   CHOL 138 11/18/2022 1153   TRIG 77 11/18/2022 1153   HDL 46 11/18/2022 1153   CHOLHDL 3 09/27/2021 1458   VLDL 15.2 09/27/2021 1458   LDLCALC 77 11/18/2022 1153   Hepatic Function Panel     Component Value Date/Time   PROT 6.8 04/10/2023 0823   ALBUMIN 4.2 04/10/2023 0823   AST 16 04/10/2023 0823   ALT 13 04/10/2023 0823   ALKPHOS 95 04/10/2023 0823   BILITOT 0.5 04/10/2023 0823      Component Value Date/Time   TSH 1.970 11/18/2022 1153   Nutritional Lab Results  Component Value Date   VD25OH 76.6 04/10/2023    VD25OH 28.6 (L) 11/18/2022   VD25OH 39.09 09/27/2021     ASSESSMENT AND PLAN  TREATMENT PLAN FOR OBESITY:  Recommended Dietary Goals  Suniya is currently in the action stage of change. As such, her goal is to continue weight management plan. She has agreed to the Category 2 Plan.  To start tracking and will review at her next visit.    Behavioral Intervention  We discussed the following Behavioral Modification Strategies today: continue to work on maintaining a reduced calorie state, getting the recommended amount of protein, incorporating whole foods, making healthy choices, staying well hydrated and practicing mindfulness when eating..  Additional resources provided today: NA  Recommended Physical Activity Goals  Jourden has been advised to work up to 150 minutes of moderate intensity aerobic activity a week and strengthening exercises 2-3 times per week for cardiovascular health, weight loss maintenance and preservation of muscle mass.   She has agreed to Think about enjoyable ways to increase daily physical activity and overcoming barriers to exercise, Increase physical activity in their day and reduce sedentary time (increase NEAT)., and Work on scheduling and tracking physical activity.    Pharmacotherapy We discussed various medication options to help Jene with her weight loss efforts and we both agreed to continue Contrave 2 po BID.  ASSOCIATED CONDITIONS ADDRESSED TODAY  Action/Plan  Polyphagia -     Contrave; Take 2 tablets by mouth 2 (two) times daily.  Dispense: 120 tablet; Refill: 0  Generalized obesity -     Contrave; Take 2 tablets by mouth 2 (two) times daily.  Dispense: 120 tablet; Refill: 0  BMI 35.0-35.9,adult         Return in about 4 weeks (around 10/22/2023).Marland Kitchen She was informed of the importance of frequent follow up visits to maximize her success with intensive lifestyle modifications for her multiple health conditions.   ATTESTASTION  STATEMENTS:  Reviewed by clinician on day of visit: allergies, medications, problem list, medical history, surgical history, family history, social history, and previous encounter notes.     Theodis Sato. Nishka Heide FNP-C

## 2023-10-04 ENCOUNTER — Other Ambulatory Visit: Payer: Self-pay | Admitting: Family Medicine

## 2023-10-04 DIAGNOSIS — I10 Essential (primary) hypertension: Secondary | ICD-10-CM

## 2023-10-06 ENCOUNTER — Other Ambulatory Visit (HOSPITAL_COMMUNITY): Payer: Self-pay

## 2023-10-06 MED ORDER — AMLODIPINE BESYLATE 2.5 MG PO TABS
2.5000 mg | ORAL_TABLET | Freq: Every day | ORAL | 3 refills | Status: DC
  Filled 2023-10-06: qty 90, 90d supply, fill #0
  Filled 2023-12-26: qty 90, 90d supply, fill #1
  Filled 2024-03-29: qty 90, 90d supply, fill #2
  Filled 2024-06-29: qty 90, 90d supply, fill #3

## 2023-10-06 MED ORDER — HYDROCHLOROTHIAZIDE 25 MG PO TABS
25.0000 mg | ORAL_TABLET | Freq: Every day | ORAL | 3 refills | Status: DC
Start: 2023-10-06 — End: 2024-09-06
  Filled 2023-10-06: qty 90, 90d supply, fill #0
  Filled 2023-12-26: qty 90, 90d supply, fill #1
  Filled 2024-03-29: qty 90, 90d supply, fill #2
  Filled 2024-06-29: qty 90, 90d supply, fill #3

## 2023-10-16 ENCOUNTER — Other Ambulatory Visit: Payer: Self-pay

## 2023-10-16 NOTE — Progress Notes (Signed)
Specialty Pharmacy Refill Coordination Note  Stephanie Long is a 47 y.o. female contacted today regarding refills of specialty medication(s) Dupilumab (Dupixent)   Patient requested Delivery   Delivery date: 10/28/23   Verified address: [address] [place], [place] [address]   Medication will be filled on 10/27/23.

## 2023-10-27 ENCOUNTER — Ambulatory Visit: Payer: Self-pay | Admitting: Family Medicine

## 2023-10-27 ENCOUNTER — Telehealth: Payer: 59 | Admitting: Family Medicine

## 2023-10-27 ENCOUNTER — Other Ambulatory Visit: Payer: Self-pay

## 2023-10-27 DIAGNOSIS — J111 Influenza due to unidentified influenza virus with other respiratory manifestations: Secondary | ICD-10-CM | POA: Diagnosis not present

## 2023-10-27 MED ORDER — OSELTAMIVIR PHOSPHATE 75 MG PO CAPS: 75.0000 mg | ORAL_CAPSULE | Freq: Two times a day (BID) | ORAL | 0 refills | Status: AC

## 2023-10-27 NOTE — Telephone Encounter (Signed)
Appt today

## 2023-10-27 NOTE — Progress Notes (Signed)
Virtual Visit Consent   Stephanie Long, you are scheduled for a virtual visit with a [facility] provider today. Just as with appointments in the office, your consent must be obtained to participate. Your consent will be active for this visit and any virtual visit you may have with one of our providers in the next 365 days. If you have a MyChart account, a copy of this consent can be sent to you electronically.  As this is a virtual visit, video technology does not allow for your provider to perform a traditional examination. This may limit your provider's ability to fully assess your condition. If your provider identifies any concerns that need to be evaluated in person or the need to arrange testing (such as labs, EKG, etc.), we will make arrangements to do so. Although advances in technology are sophisticated, we cannot ensure that it will always work on either your end or our end. If the connection with a video visit is poor, the visit may have to be switched to a telephone visit. With either a video or telephone visit, we are not always able to ensure that we have a secure connection.  By engaging in this virtual visit, you consent to the provision of healthcare and authorize for your insurance to be billed (if applicable) for the services provided during this visit. Depending on your insurance coverage, you may receive a charge related to this service.  I need to obtain your verbal consent now. Are you willing to proceed with your visit today? Stephanie Long has provided verbal consent on 10/27/2023 for a virtual visit (video or telephone). Georgana Curio, FNP  Date: 10/27/2023 6:48 PM  Virtual Visit via Video Note   I, Georgana Curio, connected with  Stephanie Long  ([id number], Mar 08, 1976) on 10/27/23 at  6:45 PM EST by a video-enabled telemedicine application and verified that I am speaking with the correct person using two identifiers.  Location: Patient: Virtual Visit  Location Patient: Home Provider: Virtual Visit Location Provider: Home Office   I discussed the limitations of evaluation and management by telemedicine and the availability of in person appointments. The patient expressed understanding and agreed to proceed.    History of Present Illness: Stephanie Long is a 47 y.o. who identifies as a female who was assigned female at birth, and is being seen today for flu like sx. She was around her sister who had the flu. She started with body aches, fever chills cough yesterday. Cough is not bad yet. In no distress.Marland Kitchen  HPI: HPI  Problems:  Patient Active Problem List   Diagnosis Date Noted   Morbid obesity: Starting BMI 40.1 02/13/2023   BMI 38.0-38.9,adult 12/04/2022   Prediabetes 11/19/2022   Insulin resistance 11/19/2022   SOB (shortness of breath) on exertion 11/18/2022   Health care maintenance 11/18/2022   Vitamin D deficiency 11/18/2022   B12 deficiency 11/18/2022   Other fatigue 11/05/2022   Gestational diabetes mellitus (GDM), antepartum 11/05/2022   Complex tear of medial meniscus of right knee 10/11/2021   Essential hypertension 09/27/2021   Right knee pain 10/09/2017   Right foot pain 10/09/2017   Plantar fasciitis of right foot 10/16/2016   Right shoulder pain 10/16/2016   Generalized obesity 10/07/2016   DERMATITIS, OTHER ATOPIC 03/24/2007   Mild persistent asthma without complication 03/10/2007    Allergies: No Known Allergies Medications:  Current Outpatient Medications:    oseltamivir (TAMIFLU) 75 MG capsule, Take 1 capsule (75 mg total) by mouth  2 (two) times daily for 5 days., Disp: 10 capsule, Rfl: 0   Acetaminophen (TYLENOL PO), Take 1 tablet by mouth as needed., Disp: , Rfl:    amLODipine (NORVASC) 2.5 MG tablet, Take 1 tablet (2.5 mg total) by mouth daily., Disp: 90 tablet, Rfl: 3   ASHWAGANDHA PO, Take by mouth., Disp: , Rfl:    calcium carbonate (TUMS - DOSED IN MG ELEMENTAL CALCIUM) 500 MG chewable tablet,  Chew 1 tablet by mouth daily as needed for indigestion or heartburn., Disp: , Rfl:    clobetasol cream (TEMOVATE) 0.05 %, Apply at night to eczema up to 4-6 weeks as needed. Not safe for long term use., Disp: 60 g, Rfl: 1   DUPIXENT 300 MG/2ML SOAJ, inject one pen every two weeks, Disp: 4 mL, Rfl: 10   fluticasone (FLOVENT HFA) 110 MCG/ACT inhaler, Inhale 1 puff into the lungs in the morning and at bedtime., Disp: 12 g, Rfl: 12   hydrochlorothiazide (HYDRODIURIL) 25 MG tablet, Take 1 tablet (25 mg total) by mouth daily., Disp: 90 tablet, Rfl: 3   hydrOXYzine (VISTARIL) 25 MG capsule, Take 1 capsule (25 mg total) by mouth every 8 (eight) hours as needed for anxiety., Disp: 30 capsule, Rfl: 0   IBUPROFEN PO, Take 1 tablet by mouth as needed., Disp: , Rfl:    MAGNESIUM CITRATE PO, Take by mouth., Disp: , Rfl:    Melatonin 5 MG TABS, Take by mouth., Disp: , Rfl:    metFORMIN (GLUCOPHAGE) 500 MG tablet, Take 1 tablet (500 mg total) by mouth 2 (two) times daily with a meal., Disp: 180 tablet, Rfl: 0   Multiple Vitamin (MULTIVITAMIN) tablet, Take 1 tablet by mouth daily., Disp: , Rfl:    Naltrexone-buPROPion HCl ER (CONTRAVE) 8-90 MG TB12, Take 2 tablets by mouth 2 (two) times daily., Disp: 120 tablet, Rfl: 0   triamcinolone ointment (KENALOG) 0.5 %, Apply topically 2 times daily as needed for eczema on hands, Disp: 45 g, Rfl: 2   VENTOLIN HFA 108 (90 Base) MCG/ACT inhaler, INHALE 2 PUFFS INTO THE LUNGS EVERY 6 HOURS AS NEEDED FOR WHEEZING OR SHORTNESS OF BREATH., Disp: 18 g, Rfl: 6   VITAMIN D PO, Take by mouth., Disp: , Rfl:   Observations/Objective: Patient is well-developed, well-nourished in no acute distress.  Resting comfortably  at home.  Head is normocephalic, atraumatic.  No labored breathing.  Speech is clear and coherent with logical content.  Patient is alert and oriented at baseline.    Assessment and Plan: 1. Influenza-like illness (Primary)  Increase fluids, humidifier at night,  tylenol or ibuprofen as directed, UC if sx worsen.   Follow Up Instructions: I discussed the assessment and treatment plan with the patient. The patient was provided an opportunity to ask questions and all were answered. The patient agreed with the plan and demonstrated an understanding of the instructions.  A copy of instructions were sent to the patient via MyChart unless otherwise noted below.     The patient was advised to call back or seek an in-person evaluation if the symptoms worsen or if the condition fails to improve as anticipated.    Georgana Curio, FNP

## 2023-10-27 NOTE — Telephone Encounter (Signed)
  Chief Complaint: Flu like symptoms Symptoms: body aches, headache, cough, nasal congestion, fever Frequency: began yesterday  Pertinent Negatives: Patient denies SOB, NVD Disposition: [] ED /[] Urgent Care (no appt availability in office) / [x] Appointment(In office/virtual)/ []  [facility]/ [] Home Care/ [] Refused Recommended Disposition /[] [facility]/ []  Follow-up with PCP Additional Notes: Pt reports fever, headache, body aches, congestion, cough that began yesterday. She states she was exposed to family member with flu around [place]. Denies NVD, SOB. States she has been taking theraflu every 4 hours, which has kept the fever down. Highest reported temp 101.2F. Per protocol, pt to be evaluated within 24 hours. Pt scheduled for virtual visit today at 1845. Care advice reviewed, pt verbalized understanding and denies further questions. Alerting PCP for review.   Copied from CRM [id number]. Topic: Clinical - Pink Word Triage >> Oct 27, 2023  9:52 AM Adele Barthel wrote: Reason for Triage: flu symptoms aches, mild head and nasal congestion, sore throat since yesterday. Highest fever was 101.4, taking Theraflu. Feels weak and would like to be tested for flu Reason for Disposition  [1] Patient is NOT HIGH RISK AND [2] strongly requests antiviral medicine AND [3] flu symptoms present < 48 hours  Answer Assessment - Initial Assessment Questions 1. WORST SYMPTOM: "What is your worst symptom?" (e.g., cough, runny nose, muscle aches, headache, sore throat, fever)      Body aches, head congestion, fever 2. ONSET: "When did your flu symptoms start?"      Yesterday morning 3. COUGH: "How bad is the cough?"       Mild 4. RESPIRATORY DISTRESS: "Describe your breathing."      Congested 5. FEVER: "Do you have a fever?" If Yes, ask: "What is your temperature, how was it measured, and when did it start?"     Normal today 6. EXPOSURE: "Were you exposed to someone with influenza?"        Sister, on Christmas- g 7. FLU VACCINE: "Did you get a flu shot this year?"     Yes 8. HIGH RISK DISEASE: "Do you have any chronic medical problems?" (e.g., heart or lung disease, asthma, weak immune system, or other HIGH RISK conditions)     Mild asthma, htn  10. OTHER SYMPTOMS: "Do you have any other symptoms?"  (e.g., runny nose, muscle aches, headache, sore throat)       Muscle aches, sore throat, headache, runny nose  Protocols used: Influenza (Flu) - [facility]

## 2023-10-27 NOTE — Patient Instructions (Signed)

## 2023-10-27 NOTE — Progress Notes (Signed)
10/27/23: Dupixent  Dupixent cc declined; left voicemail for patient to call [phone] for futher assistance and to give [place] a call back.

## 2023-10-28 ENCOUNTER — Other Ambulatory Visit: Payer: Self-pay

## 2023-10-30 ENCOUNTER — Other Ambulatory Visit: Payer: Self-pay

## 2023-11-03 ENCOUNTER — Ambulatory Visit: Payer: 59 | Admitting: Nurse Practitioner

## 2023-11-05 ENCOUNTER — Other Ambulatory Visit: Payer: Self-pay | Admitting: Nurse Practitioner

## 2023-11-05 ENCOUNTER — Other Ambulatory Visit: Payer: Self-pay

## 2023-11-05 DIAGNOSIS — R632 Polyphagia: Secondary | ICD-10-CM

## 2023-11-05 DIAGNOSIS — E669 Obesity, unspecified: Secondary | ICD-10-CM

## 2023-11-06 ENCOUNTER — Other Ambulatory Visit: Payer: Self-pay

## 2023-11-12 ENCOUNTER — Other Ambulatory Visit: Payer: Self-pay

## 2023-11-17 ENCOUNTER — Ambulatory Visit: Payer: 59 | Admitting: Nurse Practitioner

## 2023-11-18 ENCOUNTER — Other Ambulatory Visit: Payer: Self-pay

## 2023-11-18 NOTE — Progress Notes (Signed)
Patient called back and provided new Dupixent credit card. Medication will be filled 11/18/23 and will be delivered on 11/19/23 to Verified address: [address] [place], [place] [address]

## 2023-11-19 ENCOUNTER — Other Ambulatory Visit (HOSPITAL_COMMUNITY): Payer: Self-pay

## 2023-11-19 ENCOUNTER — Ambulatory Visit: Payer: Commercial Managed Care - PPO | Admitting: Nurse Practitioner

## 2023-11-19 ENCOUNTER — Other Ambulatory Visit: Payer: Self-pay

## 2023-11-19 VITALS — BP 124/79 | HR 60 | Temp 97.8°F | Ht 64.0 in | Wt 206.0 lb

## 2023-11-19 DIAGNOSIS — R7303 Prediabetes: Secondary | ICD-10-CM | POA: Diagnosis not present

## 2023-11-19 DIAGNOSIS — E669 Obesity, unspecified: Secondary | ICD-10-CM

## 2023-11-19 DIAGNOSIS — R632 Polyphagia: Secondary | ICD-10-CM

## 2023-11-19 DIAGNOSIS — Z6835 Body mass index (BMI) 35.0-35.9, adult: Secondary | ICD-10-CM

## 2023-11-19 MED ORDER — METFORMIN HCL 500 MG PO TABS
500.0000 mg | ORAL_TABLET | Freq: Two times a day (BID) | ORAL | 0 refills | Status: DC
  Filled 2023-11-19: qty 180, 90d supply, fill #0

## 2023-11-19 MED ORDER — CONTRAVE 8-90 MG PO TB12
2.0000 | ORAL_TABLET | Freq: Two times a day (BID) | ORAL | 0 refills | Status: DC
  Filled 2023-11-19: qty 120, 30d supply, fill #0

## 2023-11-19 NOTE — Progress Notes (Signed)
Office: [phone]  /  Fax: [phone]  WEIGHT SUMMARY AND BIOMETRICS  Weight Lost Since Last Visit: 0lb  Weight Gained Since Last Visit: 0lb   Vitals Temp: 97.8 F (36.6 C) BP: 124/79 Pulse Rate: 60 SpO2: 96 %   Anthropometric Measurements Height: 5\' 4"  (1.626 m) Weight: 206 lb (93.4 kg) BMI (Calculated): 35.34 Weight at Last Visit: 206lb Weight Lost Since Last Visit: 0lb Weight Gained Since Last Visit: 0lb Starting Weight: 234lb Total Weight Loss (lbs): 28 lb (12.7 kg)   Body Composition  Body Fat %: 44.1 % Fat Mass (lbs): 90.8 lbs Muscle Mass (lbs): 109.4 lbs Total Body Water (lbs): 81 lbs Visceral Fat Rating : 11   Other Clinical Data Fasting: Yes Labs: No Today's Visit #: 12 Starting Date: 11/18/22     HPI  Chief Complaint: OBESITY  Dakiyah is here to discuss her progress with her obesity treatment plan. She is on the the Category 2 Plan and states she is following her eating plan approximately 60-70 % of the time. She states she is exercising 0 minutes 0 days per week.   Interval History:  Since last office visit she has maintained her weight.  So is not currently tracking her calories/macros or exercising. She had the flu since her last visit.  She feels like she is now been able to get back on track. Feels she is in a good spot.  She is drinking water, G2, protein shake and unsweetened daily.    No upcoming traveling or celebrations.   Her highest weight was 255 lbs.     Pharmacotherapy for weight loss: She is currently taking Contrave 2 am and 2 pm for medical weight loss/polyphagia.  Denies side effects.      The authorization is effective from 09/22/2023 to 09/22/2024    Previous pharmacotherapy for medical weight loss:  Metformin   Bariatric surgery:  Patient has not had bariatric surgery.   Prediabetes Last A1c was 5.5  Medication(s): Metformin 500 mg once daily breakfast Polyphagia:Yes Lab Results  Component Value Date    HGBA1C 5.5 04/10/2023   HGBA1C 5.8 (H) 11/18/2022   HGBA1C 5.3 09/27/2021   HGBA1C 5.4 06/14/2020   HGBA1C 5.5 05/05/2019   Lab Results  Component Value Date   INSULIN 22.0 04/10/2023   INSULIN 27.7 (H) 11/18/2022     PHYSICAL EXAM:  Blood pressure 124/79, pulse 60, temperature 97.8 F (36.6 C), height 5\' 4"  (1.626 m), weight 206 lb (93.4 kg), SpO2 96%. Body mass index is 35.36 kg/m.  General: She is overweight, cooperative, alert, well developed, and in no acute distress. PSYCH: Has normal mood, affect and thought process.   Extremities: No edema.  Neurologic: No gross sensory or motor deficits. No tremors or fasciculations noted.    DIAGNOSTIC DATA REVIEWED:  BMET    Component Value Date/Time   NA 143 04/10/2023 0823   K 4.0 04/10/2023 0823   CL 102 04/10/2023 0823   CO2 27 04/10/2023 0823   GLUCOSE 92 04/10/2023 0823   GLUCOSE 80 09/27/2021 1458   BUN 13 04/10/2023 0823   CREATININE 0.79 04/10/2023 0823   CALCIUM 9.2 04/10/2023 0823   GFRNONAA >60 06/01/2018 0955   GFRAA >60 06/01/2018 0955   Lab Results  Component Value Date   HGBA1C 5.5 04/10/2023   HGBA1C 5.5 03/02/2018   Lab Results  Component Value Date   INSULIN 22.0 04/10/2023   INSULIN 27.7 (H) 11/18/2022   Lab Results  Component Value Date  TSH 1.970 11/18/2022   CBC    Component Value Date/Time   WBC 6.6 04/10/2023 0823   WBC 9.5 09/27/2021 1458   RBC 4.77 04/10/2023 0823   RBC 4.64 09/27/2021 1458   HGB 14.2 04/10/2023 0823   HCT 42.3 04/10/2023 0823   PLT 262 04/10/2023 0823   MCV 89 04/10/2023 0823   MCH 29.8 04/10/2023 0823   MCH 27.8 06/01/2018 0955   MCHC 33.6 04/10/2023 0823   MCHC 33.6 09/27/2021 1458   RDW 13.2 04/10/2023 0823   Iron Studies No results found for: "IRON", "TIBC", "FERRITIN", "IRONPCTSAT" Lipid Panel     Component Value Date/Time   CHOL 138 11/18/2022 1153   TRIG 77 11/18/2022 1153   HDL 46 11/18/2022 1153   CHOLHDL 3 09/27/2021 1458   VLDL 15.2  09/27/2021 1458   LDLCALC 77 11/18/2022 1153   Hepatic Function Panel     Component Value Date/Time   PROT 6.8 04/10/2023 0823   ALBUMIN 4.2 04/10/2023 0823   AST 16 04/10/2023 0823   ALT 13 04/10/2023 0823   ALKPHOS 95 04/10/2023 0823   BILITOT 0.5 04/10/2023 0823      Component Value Date/Time   TSH 1.970 11/18/2022 1153   Nutritional Lab Results  Component Value Date   VD25OH 76.6 04/10/2023   VD25OH 28.6 (L) 11/18/2022   VD25OH 39.09 09/27/2021     ASSESSMENT AND PLAN  TREATMENT PLAN FOR OBESITY:  Recommended Dietary Goals  Stephanie Long is currently in the action stage of change. As such, her goal is to continue weight management plan. She has agreed to the Category 2 Plan.  I have recommended that she track and will review her calories and macros at her next visit.  Behavioral Intervention  We discussed the following Behavioral Modification Strategies today: increasing lean protein intake to established goals, decreasing simple carbohydrates , increasing vegetables, increasing fiber rich foods, increasing water intake , work on meal planning and preparation, reading food labels , keeping healthy foods at home, continue to work on implementation of reduced calorie nutritional plan, continue to practice mindfulness when eating, planning for success, better snacking choices, and continue to work on maintaining a reduced calorie state, getting the recommended amount of protein, incorporating whole foods, making healthy choices, staying well hydrated and practicing mindfulness when eating..  Additional resources provided today: NA  Recommended Physical Activity Goals  Marly has been advised to work up to 150 minutes of moderate intensity aerobic activity a week and strengthening exercises 2-3 times per week for cardiovascular health, weight loss maintenance and preservation of muscle mass.   She has agreed to Think about enjoyable ways to increase daily physical activity  and overcoming barriers to exercise, Increase physical activity in their day and reduce sedentary time (increase NEAT)., and Work on scheduling and tracking physical activity.    Pharmacotherapy We discussed various medication options to help Maydell with her weight loss efforts and we both agreed to continue Contrave 2 po BID.  Side effects discussed.  ASSOCIATED CONDITIONS ADDRESSED TODAY  Action/Plan  Polyphagia -     Continue Contrave; Take 2 tablets by mouth 2 (two) times daily.  Dispense: 120 tablet; Refill: 0  Prediabetes -    Continue metFORMIN HCl; Take 1 tablet (500 mg total) by mouth 2 (two) times daily with a meal.  Dispense: 180 tablet; Refill: 0  Generalized obesity -     Contrave; Take 2 tablets by mouth 2 (two) times daily.  Dispense: 120 tablet; Refill:  0  BMI 35.0-35.9,adult         Return in about 4 weeks (around 12/17/2023).Marland Kitchen She was informed of the importance of frequent follow up visits to maximize her success with intensive lifestyle modifications for her multiple health conditions.   ATTESTASTION STATEMENTS:  Reviewed by clinician on day of visit: allergies, medications, problem list, medical history, surgical history, family history, social history, and previous encounter notes.     Theodis Sato. Safaa Stingley FNP-C

## 2023-11-27 DIAGNOSIS — M5032 Other cervical disc degeneration, mid-cervical region, unspecified level: Secondary | ICD-10-CM | POA: Diagnosis not present

## 2023-11-27 DIAGNOSIS — M9903 Segmental and somatic dysfunction of lumbar region: Secondary | ICD-10-CM | POA: Diagnosis not present

## 2023-11-27 DIAGNOSIS — M9901 Segmental and somatic dysfunction of cervical region: Secondary | ICD-10-CM | POA: Diagnosis not present

## 2023-11-27 DIAGNOSIS — M62830 Muscle spasm of back: Secondary | ICD-10-CM | POA: Diagnosis not present

## 2023-11-28 DIAGNOSIS — M5032 Other cervical disc degeneration, mid-cervical region, unspecified level: Secondary | ICD-10-CM | POA: Diagnosis not present

## 2023-11-28 DIAGNOSIS — M62830 Muscle spasm of back: Secondary | ICD-10-CM | POA: Diagnosis not present

## 2023-11-28 DIAGNOSIS — M9901 Segmental and somatic dysfunction of cervical region: Secondary | ICD-10-CM | POA: Diagnosis not present

## 2023-11-28 DIAGNOSIS — M9903 Segmental and somatic dysfunction of lumbar region: Secondary | ICD-10-CM | POA: Diagnosis not present

## 2023-12-02 DIAGNOSIS — M9903 Segmental and somatic dysfunction of lumbar region: Secondary | ICD-10-CM | POA: Diagnosis not present

## 2023-12-02 DIAGNOSIS — M5032 Other cervical disc degeneration, mid-cervical region, unspecified level: Secondary | ICD-10-CM | POA: Diagnosis not present

## 2023-12-02 DIAGNOSIS — M9901 Segmental and somatic dysfunction of cervical region: Secondary | ICD-10-CM | POA: Diagnosis not present

## 2023-12-02 DIAGNOSIS — M62830 Muscle spasm of back: Secondary | ICD-10-CM | POA: Diagnosis not present

## 2023-12-03 DIAGNOSIS — M5032 Other cervical disc degeneration, mid-cervical region, unspecified level: Secondary | ICD-10-CM | POA: Diagnosis not present

## 2023-12-03 DIAGNOSIS — M62830 Muscle spasm of back: Secondary | ICD-10-CM | POA: Diagnosis not present

## 2023-12-03 DIAGNOSIS — M9903 Segmental and somatic dysfunction of lumbar region: Secondary | ICD-10-CM | POA: Diagnosis not present

## 2023-12-03 DIAGNOSIS — M9901 Segmental and somatic dysfunction of cervical region: Secondary | ICD-10-CM | POA: Diagnosis not present

## 2023-12-04 DIAGNOSIS — M9903 Segmental and somatic dysfunction of lumbar region: Secondary | ICD-10-CM | POA: Diagnosis not present

## 2023-12-04 DIAGNOSIS — M9901 Segmental and somatic dysfunction of cervical region: Secondary | ICD-10-CM | POA: Diagnosis not present

## 2023-12-04 DIAGNOSIS — M62830 Muscle spasm of back: Secondary | ICD-10-CM | POA: Diagnosis not present

## 2023-12-04 DIAGNOSIS — M5032 Other cervical disc degeneration, mid-cervical region, unspecified level: Secondary | ICD-10-CM | POA: Diagnosis not present

## 2023-12-09 DIAGNOSIS — M5032 Other cervical disc degeneration, mid-cervical region, unspecified level: Secondary | ICD-10-CM | POA: Diagnosis not present

## 2023-12-09 DIAGNOSIS — M9903 Segmental and somatic dysfunction of lumbar region: Secondary | ICD-10-CM | POA: Diagnosis not present

## 2023-12-09 DIAGNOSIS — M9901 Segmental and somatic dysfunction of cervical region: Secondary | ICD-10-CM | POA: Diagnosis not present

## 2023-12-09 DIAGNOSIS — M62830 Muscle spasm of back: Secondary | ICD-10-CM | POA: Diagnosis not present

## 2023-12-10 DIAGNOSIS — M62830 Muscle spasm of back: Secondary | ICD-10-CM | POA: Diagnosis not present

## 2023-12-10 DIAGNOSIS — M9901 Segmental and somatic dysfunction of cervical region: Secondary | ICD-10-CM | POA: Diagnosis not present

## 2023-12-10 DIAGNOSIS — M5032 Other cervical disc degeneration, mid-cervical region, unspecified level: Secondary | ICD-10-CM | POA: Diagnosis not present

## 2023-12-10 DIAGNOSIS — M9903 Segmental and somatic dysfunction of lumbar region: Secondary | ICD-10-CM | POA: Diagnosis not present

## 2023-12-11 DIAGNOSIS — M9903 Segmental and somatic dysfunction of lumbar region: Secondary | ICD-10-CM | POA: Diagnosis not present

## 2023-12-11 DIAGNOSIS — M62830 Muscle spasm of back: Secondary | ICD-10-CM | POA: Diagnosis not present

## 2023-12-11 DIAGNOSIS — M5032 Other cervical disc degeneration, mid-cervical region, unspecified level: Secondary | ICD-10-CM | POA: Diagnosis not present

## 2023-12-11 DIAGNOSIS — M9901 Segmental and somatic dysfunction of cervical region: Secondary | ICD-10-CM | POA: Diagnosis not present

## 2023-12-16 DIAGNOSIS — M5032 Other cervical disc degeneration, mid-cervical region, unspecified level: Secondary | ICD-10-CM | POA: Diagnosis not present

## 2023-12-16 DIAGNOSIS — M9901 Segmental and somatic dysfunction of cervical region: Secondary | ICD-10-CM | POA: Diagnosis not present

## 2023-12-16 DIAGNOSIS — M62830 Muscle spasm of back: Secondary | ICD-10-CM | POA: Diagnosis not present

## 2023-12-16 DIAGNOSIS — M9903 Segmental and somatic dysfunction of lumbar region: Secondary | ICD-10-CM | POA: Diagnosis not present

## 2023-12-18 ENCOUNTER — Other Ambulatory Visit: Payer: Self-pay

## 2023-12-19 DIAGNOSIS — M9903 Segmental and somatic dysfunction of lumbar region: Secondary | ICD-10-CM | POA: Diagnosis not present

## 2023-12-19 DIAGNOSIS — M9901 Segmental and somatic dysfunction of cervical region: Secondary | ICD-10-CM | POA: Diagnosis not present

## 2023-12-19 DIAGNOSIS — M5032 Other cervical disc degeneration, mid-cervical region, unspecified level: Secondary | ICD-10-CM | POA: Diagnosis not present

## 2023-12-19 DIAGNOSIS — M62830 Muscle spasm of back: Secondary | ICD-10-CM | POA: Diagnosis not present

## 2023-12-22 ENCOUNTER — Other Ambulatory Visit: Payer: Self-pay

## 2023-12-22 ENCOUNTER — Other Ambulatory Visit: Payer: Self-pay | Admitting: Nurse Practitioner

## 2023-12-22 DIAGNOSIS — E669 Obesity, unspecified: Secondary | ICD-10-CM

## 2023-12-22 DIAGNOSIS — R632 Polyphagia: Secondary | ICD-10-CM

## 2023-12-22 NOTE — Progress Notes (Signed)
 Specialty Pharmacy Refill Coordination Note  Stephanie Long is a 48 y.o. female contacted today regarding refills of specialty medication(s) Dupilumab (Dupixent)   Patient requested (Patient-Rptd) Delivery   Delivery date: (Patient-Rptd) 01/06/24   Verified address: (Patient-Rptd) [address]   Medication will be filled on 03.10.25.

## 2023-12-23 DIAGNOSIS — M9901 Segmental and somatic dysfunction of cervical region: Secondary | ICD-10-CM | POA: Diagnosis not present

## 2023-12-23 DIAGNOSIS — M9903 Segmental and somatic dysfunction of lumbar region: Secondary | ICD-10-CM | POA: Diagnosis not present

## 2023-12-23 DIAGNOSIS — M5032 Other cervical disc degeneration, mid-cervical region, unspecified level: Secondary | ICD-10-CM | POA: Diagnosis not present

## 2023-12-23 DIAGNOSIS — M62830 Muscle spasm of back: Secondary | ICD-10-CM | POA: Diagnosis not present

## 2023-12-25 ENCOUNTER — Other Ambulatory Visit: Payer: Self-pay

## 2023-12-26 ENCOUNTER — Encounter: Payer: Self-pay | Admitting: Bariatrics

## 2023-12-26 ENCOUNTER — Other Ambulatory Visit: Payer: Self-pay

## 2023-12-26 ENCOUNTER — Other Ambulatory Visit: Payer: Self-pay | Admitting: Nurse Practitioner

## 2023-12-26 DIAGNOSIS — R632 Polyphagia: Secondary | ICD-10-CM

## 2023-12-26 DIAGNOSIS — E669 Obesity, unspecified: Secondary | ICD-10-CM

## 2023-12-30 ENCOUNTER — Other Ambulatory Visit: Payer: Self-pay | Admitting: Family Medicine

## 2023-12-30 DIAGNOSIS — R632 Polyphagia: Secondary | ICD-10-CM

## 2023-12-30 DIAGNOSIS — E669 Obesity, unspecified: Secondary | ICD-10-CM

## 2023-12-30 MED ORDER — CONTRAVE 8-90 MG PO TB12
2.0000 | ORAL_TABLET | Freq: Two times a day (BID) | ORAL | 0 refills | Status: DC
  Filled 2023-12-30: qty 120, 30d supply, fill #0

## 2023-12-31 ENCOUNTER — Other Ambulatory Visit (HOSPITAL_COMMUNITY): Payer: Self-pay

## 2023-12-31 ENCOUNTER — Other Ambulatory Visit: Payer: Self-pay

## 2023-12-31 DIAGNOSIS — M5032 Other cervical disc degeneration, mid-cervical region, unspecified level: Secondary | ICD-10-CM | POA: Diagnosis not present

## 2023-12-31 DIAGNOSIS — M62830 Muscle spasm of back: Secondary | ICD-10-CM | POA: Diagnosis not present

## 2023-12-31 DIAGNOSIS — M9901 Segmental and somatic dysfunction of cervical region: Secondary | ICD-10-CM | POA: Diagnosis not present

## 2023-12-31 DIAGNOSIS — M9903 Segmental and somatic dysfunction of lumbar region: Secondary | ICD-10-CM | POA: Diagnosis not present

## 2024-01-01 DIAGNOSIS — M9903 Segmental and somatic dysfunction of lumbar region: Secondary | ICD-10-CM | POA: Diagnosis not present

## 2024-01-01 DIAGNOSIS — M62830 Muscle spasm of back: Secondary | ICD-10-CM | POA: Diagnosis not present

## 2024-01-01 DIAGNOSIS — M9901 Segmental and somatic dysfunction of cervical region: Secondary | ICD-10-CM | POA: Diagnosis not present

## 2024-01-01 DIAGNOSIS — M5032 Other cervical disc degeneration, mid-cervical region, unspecified level: Secondary | ICD-10-CM | POA: Diagnosis not present

## 2024-01-02 ENCOUNTER — Other Ambulatory Visit: Payer: Self-pay

## 2024-01-05 DIAGNOSIS — N6310 Unspecified lump in the right breast, unspecified quadrant: Secondary | ICD-10-CM | POA: Diagnosis not present

## 2024-01-05 DIAGNOSIS — Z01419 Encounter for gynecological examination (general) (routine) without abnormal findings: Secondary | ICD-10-CM | POA: Diagnosis not present

## 2024-01-05 DIAGNOSIS — E282 Polycystic ovarian syndrome: Secondary | ICD-10-CM | POA: Diagnosis not present

## 2024-01-05 DIAGNOSIS — I10 Essential (primary) hypertension: Secondary | ICD-10-CM | POA: Diagnosis not present

## 2024-01-05 DIAGNOSIS — Z30431 Encounter for routine checking of intrauterine contraceptive device: Secondary | ICD-10-CM | POA: Diagnosis not present

## 2024-01-06 ENCOUNTER — Encounter: Payer: Self-pay | Admitting: Nurse Practitioner

## 2024-01-06 ENCOUNTER — Ambulatory Visit: Payer: Commercial Managed Care - PPO | Admitting: Nurse Practitioner

## 2024-01-06 ENCOUNTER — Other Ambulatory Visit: Payer: Self-pay

## 2024-01-06 ENCOUNTER — Other Ambulatory Visit (HOSPITAL_COMMUNITY): Payer: Self-pay

## 2024-01-06 VITALS — BP 115/78 | HR 71 | Temp 98.0°F | Ht 64.0 in | Wt 202.0 lb

## 2024-01-06 DIAGNOSIS — R7303 Prediabetes: Secondary | ICD-10-CM

## 2024-01-06 DIAGNOSIS — R632 Polyphagia: Secondary | ICD-10-CM

## 2024-01-06 DIAGNOSIS — Z6834 Body mass index (BMI) 34.0-34.9, adult: Secondary | ICD-10-CM | POA: Diagnosis not present

## 2024-01-06 DIAGNOSIS — E559 Vitamin D deficiency, unspecified: Secondary | ICD-10-CM | POA: Diagnosis not present

## 2024-01-06 DIAGNOSIS — E669 Obesity, unspecified: Secondary | ICD-10-CM

## 2024-01-06 DIAGNOSIS — M62830 Muscle spasm of back: Secondary | ICD-10-CM | POA: Diagnosis not present

## 2024-01-06 DIAGNOSIS — M5032 Other cervical disc degeneration, mid-cervical region, unspecified level: Secondary | ICD-10-CM | POA: Diagnosis not present

## 2024-01-06 DIAGNOSIS — Z13220 Encounter for screening for lipoid disorders: Secondary | ICD-10-CM | POA: Diagnosis not present

## 2024-01-06 DIAGNOSIS — Z79899 Other long term (current) drug therapy: Secondary | ICD-10-CM

## 2024-01-06 DIAGNOSIS — M9903 Segmental and somatic dysfunction of lumbar region: Secondary | ICD-10-CM | POA: Diagnosis not present

## 2024-01-06 DIAGNOSIS — M9901 Segmental and somatic dysfunction of cervical region: Secondary | ICD-10-CM | POA: Diagnosis not present

## 2024-01-06 MED ORDER — CONTRAVE 8-90 MG PO TB12
2.0000 | ORAL_TABLET | Freq: Two times a day (BID) | ORAL | 0 refills | Status: DC
  Filled 2024-01-06: qty 120, 30d supply, fill #0

## 2024-01-06 NOTE — Progress Notes (Signed)
 Office: [phone]  /  Fax: [phone]  WEIGHT SUMMARY AND BIOMETRICS  Weight Lost Since Last Visit: 4 lb  Weight Gained Since Last Visit: 0 lb   Vitals Temp: 98 F (36.7 C) BP: 115/78 Pulse Rate: 71 SpO2: 96 %   Anthropometric Measurements Height: 5\' 4"  (1.626 m) Weight: 202 lb (91.6 kg) BMI (Calculated): 34.66 Weight at Last Visit: 206 lb Weight Lost Since Last Visit: 4 lb Weight Gained Since Last Visit: 0 lb Starting Weight: 234 lb Total Weight Loss (lbs): 32 lb (14.5 kg)   Body Composition  Body Fat %: 42.2 % Fat Mass (lbs): 85.6 lbs Muscle Mass (lbs): 111.2 lbs Total Body Water (lbs): 80.8 lbs Visceral Fat Rating : 10   Other Clinical Data Fasting: Yes Labs: No Today's Visit #: 13 Starting Date: 11/18/22     HPI  Chief Complaint: OBESITY  Stephanie Long is here to discuss her progress with her obesity treatment plan. She is on the the Category 2 Plan and states she is following her eating plan approximately 70 % of the time. She states she is walking,cardio and strength exercise.    Interval History:  Since last office visit she has lost 4 pounds.  She is doing well with weight loss.  Body fat % has decreased and muscle mass increased.  She is walking several days per week and going to the gym one day per week.     Her highest weight was 255 lbs.     Pharmacotherapy for weight loss: She is currently taking Contrave 2 am and 2 pm for medical weight loss/polyphagia.  Denies side effects.     Contrave helps with portion sizes and polyphagia but she is still struggling with cravings.     The authorization is effective from 09/22/2023 to 09/22/2024    Previous pharmacotherapy for medical weight loss:  Metformin   Bariatric surgery:  Patient has not had bariatric surgery.   Prediabetes Last A1c was 5.5  Medication(s): Metformin 500 mg twice daily with meals Polyphagia:Yes Lab Results  Component Value Date   HGBA1C 5.5 04/10/2023   HGBA1C 5.8  (H) 11/18/2022   HGBA1C 5.3 09/27/2021   HGBA1C 5.4 06/14/2020   HGBA1C 5.5 05/05/2019   Lab Results  Component Value Date   INSULIN 22.0 04/10/2023   INSULIN 27.7 (H) 11/18/2022   Vit D deficiency  She is taking Vit D OTC.  Has taken Vit D 50,000 international units  weekly in the past.   Lab Results  Component Value Date   VD25OH 76.6 04/10/2023   VD25OH 28.6 (L) 11/18/2022   VD25OH 39.09 09/27/2021     PHYSICAL EXAM:  Blood pressure 115/78, pulse 71, temperature 98 F (36.7 C), height 5\' 4"  (1.626 m), weight 202 lb (91.6 kg), SpO2 96%. Body mass index is 34.67 kg/m.  General: She is overweight, cooperative, alert, well developed, and in no acute distress. PSYCH: Has normal mood, affect and thought process.   Extremities: No edema.  Neurologic: No gross sensory or motor deficits. No tremors or fasciculations noted.    DIAGNOSTIC DATA REVIEWED:  BMET    Component Value Date/Time   NA 143 04/10/2023 0823   K 4.0 04/10/2023 0823   CL 102 04/10/2023 0823   CO2 27 04/10/2023 0823   GLUCOSE 92 04/10/2023 0823   GLUCOSE 80 09/27/2021 1458   BUN 13 04/10/2023 0823   CREATININE 0.79 04/10/2023 0823   CALCIUM 9.2 04/10/2023 0823   GFRNONAA >60 06/01/2018 2956  GFRAA >60 06/01/2018 0955   Lab Results  Component Value Date   HGBA1C 5.5 04/10/2023   HGBA1C 5.5 03/02/2018   Lab Results  Component Value Date   INSULIN 22.0 04/10/2023   INSULIN 27.7 (H) 11/18/2022   Lab Results  Component Value Date   TSH 1.970 11/18/2022   CBC    Component Value Date/Time   WBC 6.6 04/10/2023 0823   WBC 9.5 09/27/2021 1458   RBC 4.77 04/10/2023 0823   RBC 4.64 09/27/2021 1458   HGB 14.2 04/10/2023 0823   HCT 42.3 04/10/2023 0823   PLT 262 04/10/2023 0823   MCV 89 04/10/2023 0823   MCH 29.8 04/10/2023 0823   MCH 27.8 06/01/2018 0955   MCHC 33.6 04/10/2023 0823   MCHC 33.6 09/27/2021 1458   RDW 13.2 04/10/2023 0823   Iron Studies No results found for: "IRON", "TIBC",  "FERRITIN", "IRONPCTSAT" Lipid Panel     Component Value Date/Time   CHOL 138 11/18/2022 1153   TRIG 77 11/18/2022 1153   HDL 46 11/18/2022 1153   CHOLHDL 3 09/27/2021 1458   VLDL 15.2 09/27/2021 1458   LDLCALC 77 11/18/2022 1153   Hepatic Function Panel     Component Value Date/Time   PROT 6.8 04/10/2023 0823   ALBUMIN 4.2 04/10/2023 0823   AST 16 04/10/2023 0823   ALT 13 04/10/2023 0823   ALKPHOS 95 04/10/2023 0823   BILITOT 0.5 04/10/2023 0823      Component Value Date/Time   TSH 1.970 11/18/2022 1153   Nutritional Lab Results  Component Value Date   VD25OH 76.6 04/10/2023   VD25OH 28.6 (L) 11/18/2022   VD25OH 39.09 09/27/2021     ASSESSMENT AND PLAN  TREATMENT PLAN FOR OBESITY:  Recommended Dietary Goals  Stephanie Long is currently in the action stage of change. As such, her goal is to continue weight management plan. She has agreed to the Category 2 Plan.  Behavioral Intervention  We discussed the following Behavioral Modification Strategies today: increasing lean protein intake to established goals, decreasing simple carbohydrates , increasing vegetables, increasing water intake , work on meal planning and preparation, keeping healthy foods at home, continue to work on implementation of reduced calorie nutritional plan, continue to practice mindfulness when eating, planning for success, and continue to work on maintaining a reduced calorie state, getting the recommended amount of protein, incorporating whole foods, making healthy choices, staying well hydrated and practicing mindfulness when eating..  Additional resources provided today: NA  Recommended Physical Activity Goals  Stephanie Long has been advised to work up to 150 minutes of moderate intensity aerobic activity a week and strengthening exercises 2-3 times per week for cardiovascular health, weight loss maintenance and preservation of muscle mass.   She has agreed to Think about enjoyable ways to increase  daily physical activity and overcoming barriers to exercise, Increase physical activity in their day and reduce sedentary time (increase NEAT)., Increase the intensity, frequency or duration of strengthening exercises , and Increase the intensity, frequency or duration of aerobic exercises     Pharmacotherapy We discussed various medication options to help Stephanie Long with her weight loss efforts and we both agreed to continue Contrave 2 po BID.  Side effects discussed.   ASSOCIATED CONDITIONS ADDRESSED TODAY  Action/Plan  Prediabetes -     Hemoglobin A1c -     Insulin, random  Vitamin D deficiency -     VITAMIN D 25 Hydroxy (Vit-D Deficiency, Fractures)  Polyphagia -     Contrave; Take  2 tablets by mouth 2 (two) times daily.  Dispense: 120 tablet; Refill: 0  Screening for hyperlipidemia -     Lipid Panel With LDL/HDL Ratio  Medication management -     Vitamin B12 -     Comprehensive metabolic panel -     TSH  Generalized obesity -     Contrave; Take 2 tablets by mouth 2 (two) times daily.  Dispense: 120 tablet; Refill: 0  BMI 34.0-34.9,adult -     TSH         Return in about 4 weeks (around 02/03/2024).Marland Kitchen She was informed of the importance of frequent follow up visits to maximize her success with intensive lifestyle modifications for her multiple health conditions.   ATTESTASTION STATEMENTS:  Reviewed by clinician on day of visit: allergies, medications, problem list, medical history, surgical history, family history, social history, and previous encounter notes.     Theodis Sato. Coulton Schlink FNP-C

## 2024-01-07 DIAGNOSIS — M5032 Other cervical disc degeneration, mid-cervical region, unspecified level: Secondary | ICD-10-CM | POA: Diagnosis not present

## 2024-01-07 DIAGNOSIS — M62830 Muscle spasm of back: Secondary | ICD-10-CM | POA: Diagnosis not present

## 2024-01-07 DIAGNOSIS — M9903 Segmental and somatic dysfunction of lumbar region: Secondary | ICD-10-CM | POA: Diagnosis not present

## 2024-01-07 DIAGNOSIS — M9901 Segmental and somatic dysfunction of cervical region: Secondary | ICD-10-CM | POA: Diagnosis not present

## 2024-01-07 LAB — COMPREHENSIVE METABOLIC PANEL
ALT: 13 IU/L (ref 0–32)
AST: 17 IU/L (ref 0–40)
Albumin: 4.4 g/dL (ref 3.9–4.9)
Alkaline Phosphatase: 83 IU/L (ref 44–121)
BUN/Creatinine Ratio: 16 (ref 9–23)
BUN: 14 mg/dL (ref 6–24)
Bilirubin Total: 0.8 mg/dL (ref 0.0–1.2)
CO2: 26 mmol/L (ref 20–29)
Calcium: 9.1 mg/dL (ref 8.7–10.2)
Chloride: 100 mmol/L (ref 96–106)
Creatinine, Ser: 0.85 mg/dL (ref 0.57–1.00)
Globulin, Total: 2.5 g/dL (ref 1.5–4.5)
Glucose: 91 mg/dL (ref 70–99)
Potassium: 4.1 mmol/L (ref 3.5–5.2)
Sodium: 140 mmol/L (ref 134–144)
Total Protein: 6.9 g/dL (ref 6.0–8.5)
eGFR: 86 mL/min/{1.73_m2} (ref >59)

## 2024-01-07 LAB — INSULIN, RANDOM: INSULIN: 20.6 u[IU]/mL (ref 2.6–24.9)

## 2024-01-07 LAB — LIPID PANEL WITH LDL/HDL RATIO
Cholesterol, Total: 144 mg/dL (ref 100–199)
HDL: 51 mg/dL (ref >39)
LDL Chol Calc (NIH): 79 mg/dL (ref 0–99)
LDL/HDL Ratio: 1.5 ratio (ref 0.0–3.2)
Triglycerides: 68 mg/dL (ref 0–149)
VLDL Cholesterol Cal: 14 mg/dL (ref 5–40)

## 2024-01-07 LAB — HEMOGLOBIN A1C
Est. average glucose Bld gHb Est-mCnc: 105 mg/dL
Hgb A1c MFr Bld: 5.3 % (ref 4.8–5.6)

## 2024-01-07 LAB — VITAMIN B12: Vitamin B-12: 466 pg/mL (ref 232–1245)

## 2024-01-07 LAB — TSH: TSH: 2.8 u[IU]/mL (ref 0.450–4.500)

## 2024-01-07 LAB — VITAMIN D 25 HYDROXY (VIT D DEFICIENCY, FRACTURES): Vit D, 25-Hydroxy: 44.9 ng/mL (ref 30.0–100.0)

## 2024-01-09 DIAGNOSIS — M5032 Other cervical disc degeneration, mid-cervical region, unspecified level: Secondary | ICD-10-CM | POA: Diagnosis not present

## 2024-01-09 DIAGNOSIS — M9903 Segmental and somatic dysfunction of lumbar region: Secondary | ICD-10-CM | POA: Diagnosis not present

## 2024-01-09 DIAGNOSIS — M9901 Segmental and somatic dysfunction of cervical region: Secondary | ICD-10-CM | POA: Diagnosis not present

## 2024-01-09 DIAGNOSIS — M62830 Muscle spasm of back: Secondary | ICD-10-CM | POA: Diagnosis not present

## 2024-01-12 DIAGNOSIS — M5032 Other cervical disc degeneration, mid-cervical region, unspecified level: Secondary | ICD-10-CM | POA: Diagnosis not present

## 2024-01-12 DIAGNOSIS — M9901 Segmental and somatic dysfunction of cervical region: Secondary | ICD-10-CM | POA: Diagnosis not present

## 2024-01-12 DIAGNOSIS — M9903 Segmental and somatic dysfunction of lumbar region: Secondary | ICD-10-CM | POA: Diagnosis not present

## 2024-01-12 DIAGNOSIS — M62830 Muscle spasm of back: Secondary | ICD-10-CM | POA: Diagnosis not present

## 2024-01-15 DIAGNOSIS — M9901 Segmental and somatic dysfunction of cervical region: Secondary | ICD-10-CM | POA: Diagnosis not present

## 2024-01-15 DIAGNOSIS — M9903 Segmental and somatic dysfunction of lumbar region: Secondary | ICD-10-CM | POA: Diagnosis not present

## 2024-01-15 DIAGNOSIS — M5032 Other cervical disc degeneration, mid-cervical region, unspecified level: Secondary | ICD-10-CM | POA: Diagnosis not present

## 2024-01-15 DIAGNOSIS — M62830 Muscle spasm of back: Secondary | ICD-10-CM | POA: Diagnosis not present

## 2024-01-20 ENCOUNTER — Other Ambulatory Visit: Payer: Self-pay | Admitting: Obstetrics and Gynecology

## 2024-01-20 DIAGNOSIS — M9903 Segmental and somatic dysfunction of lumbar region: Secondary | ICD-10-CM | POA: Diagnosis not present

## 2024-01-20 DIAGNOSIS — M62830 Muscle spasm of back: Secondary | ICD-10-CM | POA: Diagnosis not present

## 2024-01-20 DIAGNOSIS — M5032 Other cervical disc degeneration, mid-cervical region, unspecified level: Secondary | ICD-10-CM | POA: Diagnosis not present

## 2024-01-20 DIAGNOSIS — M9901 Segmental and somatic dysfunction of cervical region: Secondary | ICD-10-CM | POA: Diagnosis not present

## 2024-01-20 DIAGNOSIS — N6311 Unspecified lump in the right breast, upper outer quadrant: Secondary | ICD-10-CM

## 2024-01-22 DIAGNOSIS — M9901 Segmental and somatic dysfunction of cervical region: Secondary | ICD-10-CM | POA: Diagnosis not present

## 2024-01-22 DIAGNOSIS — M5032 Other cervical disc degeneration, mid-cervical region, unspecified level: Secondary | ICD-10-CM | POA: Diagnosis not present

## 2024-01-22 DIAGNOSIS — M62830 Muscle spasm of back: Secondary | ICD-10-CM | POA: Diagnosis not present

## 2024-01-22 DIAGNOSIS — M9903 Segmental and somatic dysfunction of lumbar region: Secondary | ICD-10-CM | POA: Diagnosis not present

## 2024-01-27 ENCOUNTER — Other Ambulatory Visit (HOSPITAL_COMMUNITY): Payer: Self-pay

## 2024-01-27 ENCOUNTER — Other Ambulatory Visit: Payer: Self-pay

## 2024-01-27 DIAGNOSIS — M9901 Segmental and somatic dysfunction of cervical region: Secondary | ICD-10-CM | POA: Diagnosis not present

## 2024-01-27 DIAGNOSIS — M5032 Other cervical disc degeneration, mid-cervical region, unspecified level: Secondary | ICD-10-CM | POA: Diagnosis not present

## 2024-01-27 DIAGNOSIS — M62830 Muscle spasm of back: Secondary | ICD-10-CM | POA: Diagnosis not present

## 2024-01-27 DIAGNOSIS — M9903 Segmental and somatic dysfunction of lumbar region: Secondary | ICD-10-CM | POA: Diagnosis not present

## 2024-01-27 NOTE — Progress Notes (Signed)
 Specialty Pharmacy Ongoing Clinical Assessment Note  Stephanie Long is a 48 y.o. female who is being followed by the specialty pharmacy service for RxSp Atopic Dermatitis   Patient's specialty medication(s) reviewed today: Dupilumab (Dupixent)   Missed doses in the last 4 weeks: 1 (patient was sick)   Patient/Caregiver did not have any additional questions or concerns.   Therapeutic benefit summary: Patient is achieving benefit   Adverse events/side effects summary: No adverse events/side effects   Patient's therapy is appropriate to: Continue    Goals Addressed             This Visit's Progress    Minimize recurrence of flares       Patient is on track. Patient will maintain adherence.  Stephanie Long reports being well-controlled when on therapy.  She did have a flare when she had to hold a dose due to illness, it has resolved at this time.         Follow up:  6 months  Servando Snare Specialty Pharmacist

## 2024-01-27 NOTE — Progress Notes (Signed)
 Specialty Pharmacy Refill Coordination Note  Stephanie Long is a 48 y.o. female contacted today regarding refills of specialty medication(s) Dupilumab (Dupixent)   Patient requested Delivery   Delivery date: 02/06/24   Verified address: [address]   [place] [address]   Medication will be filled on 02/05/24.

## 2024-01-30 DIAGNOSIS — M62830 Muscle spasm of back: Secondary | ICD-10-CM | POA: Diagnosis not present

## 2024-01-30 DIAGNOSIS — M9901 Segmental and somatic dysfunction of cervical region: Secondary | ICD-10-CM | POA: Diagnosis not present

## 2024-01-30 DIAGNOSIS — M5032 Other cervical disc degeneration, mid-cervical region, unspecified level: Secondary | ICD-10-CM | POA: Diagnosis not present

## 2024-01-30 DIAGNOSIS — M9903 Segmental and somatic dysfunction of lumbar region: Secondary | ICD-10-CM | POA: Diagnosis not present

## 2024-02-03 ENCOUNTER — Encounter: Payer: Self-pay | Admitting: Family Medicine

## 2024-02-03 ENCOUNTER — Ambulatory Visit: Admitting: Family Medicine

## 2024-02-03 VITALS — BP 143/86 | HR 65 | Temp 98.2°F | Ht 64.0 in | Wt 205.0 lb

## 2024-02-03 DIAGNOSIS — R632 Polyphagia: Secondary | ICD-10-CM | POA: Diagnosis not present

## 2024-02-03 DIAGNOSIS — E66812 Obesity, class 2: Secondary | ICD-10-CM | POA: Diagnosis not present

## 2024-02-03 DIAGNOSIS — E88819 Insulin resistance, unspecified: Secondary | ICD-10-CM

## 2024-02-03 DIAGNOSIS — E669 Obesity, unspecified: Secondary | ICD-10-CM

## 2024-02-03 DIAGNOSIS — Z6835 Body mass index (BMI) 35.0-35.9, adult: Secondary | ICD-10-CM

## 2024-02-03 MED ORDER — CONTRAVE 8-90 MG PO TB12
2.0000 | ORAL_TABLET | Freq: Two times a day (BID) | ORAL | 0 refills | Status: DC
  Filled 2024-02-03: qty 120, 30d supply, fill #0

## 2024-02-03 NOTE — Progress Notes (Unsigned)
 Office: [phone]  /  Fax: [phone]  WEIGHT SUMMARY AND BIOMETRICS  Starting Date: 11/18/22  Starting Weight: 234lb   Weight Lost Since Last Visit: 0lb   Vitals Temp: 98.2 F (36.8 C) BP: (!) 143/86 Pulse Rate: 65 SpO2: 95 %   Body Composition  Body Fat %: 43.9 % Fat Mass (lbs): 90.4 lbs Muscle Mass (lbs): 109.6 lbs Total Body Water (lbs): 85 lbs Visceral Fat Rating : 11    HPI  Chief Complaint: OBESITY  Miki is here to discuss her progress with her obesity treatment plan. She is on the the Category 2 Plan and states she is following her eating plan approximately 75 % of the time. She states she is exercising 30-45 minutes 1 times per week.  Interval History:  Since last office visit she is up 3 lb She had an episode of feeling shakey with high Bps She did reduce Contrave to 2 tabs in the morning and 1 tab in the evening She has good appetite control She craves sugar but has been mindful of food choices She denies meal skipping She is walking the dog 1 mile most days of the week She is trying to find time to get the Y- going 2 x a week Her net weight loss is 29 lb in the past 14 lb This is a 12.3% TBW loss She would like to get to < 200 lb as her next goal  Pharmacotherapy: metformin 500 mg bid and Contrave 2 tabs in the morning and 1 tab in the evening  PHYSICAL EXAM:  Blood pressure (!) 143/86, pulse 65, temperature 98.2 F (36.8 C), height 5\' 4"  (1.626 m), weight 205 lb (93 kg), SpO2 95%. Body mass index is 35.19 kg/m.  General: She is overweight, cooperative, alert, well developed, and in no acute distress. PSYCH: Has normal mood, affect and thought process.   Lungs: Normal breathing effort, no conversational dyspnea.   ASSESSMENT AND PLAN  TREATMENT PLAN FOR OBESITY:  Recommended Dietary Goals  Aleecia is currently in the action stage of change. As such, her goal is to continue weight management plan. She has agreed to the  Category 2 Plan.  Behavioral Intervention  We discussed the following Behavioral Modification Strategies today: increasing lean protein intake to established goals, increasing fiber rich foods, increasing water intake , keeping healthy foods at home, practice mindfulness eating and understand the difference between hunger signals and cravings, work on managing stress, creating time for self-care and relaxation, avoiding temptations and identifying enticing environmental cues, and continue to work on maintaining a reduced calorie state, getting the recommended amount of protein, incorporating whole foods, making healthy choices, staying well hydrated and practicing mindfulness when eating.. Reviewed dietary change goals together on after visit summary  Additional resources provided today: NA  Recommended Physical Activity Goals  Darnetta has been advised to work up to 150 minutes of moderate intensity aerobic activity a week and strengthening exercises 2-3 times per week for cardiovascular health, weight loss maintenance and preservation of muscle mass.   She has agreed to Increase the intensity, frequency or duration of strengthening exercises  and Increase the intensity, frequency or duration of aerobic exercises   Reviewed exercise change goals get on after visit summary  Pharmacotherapy changes for the treatment of obesity: None  ASSOCIATED CONDITIONS ADDRESSED TODAY  Polyphagia Polyphagia improved on Contrave.  Had 1 isolated episode of feeling shaky.  Blood pressure mildly elevated today while in a rush on the way here.  He has improved appetite control on Contrave 2 tabs twice daily.  Will resume.  Aim for 100 g of dietary protein intake.  Work on increasing fiber intake with fruits and vegetables and aiming for over 64 ounces of water intake daily  -     Contrave; Take 2 tablets by mouth 2 (two) times daily.  Dispense: 120 tablet; Refill: 0  Class 2 severe obesity due to excess  calories with serious comorbidity and body mass index (BMI) of 35.0 to 35.9 in adult (HCC) -     Contrave; Take 2 tablets by mouth 2 (two) times daily.  Dispense: 120 tablet; Refill: 0  Insulin resistance Last fasting insulin elevated at 20.6, labs reviewed from last visit dated 01/06/2024.  This was a reduction from 22) 27.7.  She is still sitting in the insulin resistant range.  She is on metformin 500 mg twice daily.  This has helped some with carb and sugar cravings but she has room for improvement with consistency especially while at work.  She also has room for improvement with consistency with regular exercise and recommend tracking of daily steps.  She denies adverse side effects from metformin.  Continue to work on healthy lifestyle changes and continue metformin 500 mg twice daily.     She was informed of the importance of frequent follow up visits to maximize her success with intensive lifestyle modifications for her multiple health conditions.   ATTESTASTION STATEMENTS:  Reviewed by clinician on day of visit: allergies, medications, problem list, medical history, surgical history, family history, social history, and previous encounter notes pertinent to obesity diagnosis.   I have personally spent 30 minutes total time today in preparation, patient care, nutritional counseling and education,  and documentation for this visit, including the following: review of most recent clinical lab tests, prescribing medications/ refilling medications, reviewing medical assistant documentation, review and interpretation of bioimpedence results.     Seymour Bars, D.O. DABFM, DABOM [facility] [address] [place], [place] [address] [phone]

## 2024-02-03 NOTE — Patient Instructions (Signed)
 My[employer] APP  Aim for 1400 cal / day This should include 90-110 g of protein daily  Avoid high sugar foods and drinks-- keep products < 8 g of sugar per serving  Work on tracking daily steps with a goal of > 8,000 steps/ day -- walk during daughter's practice/ gym workouts  Hydrate well with water/ sugar free electrolytes  Go back up on Contrave to 2 tabs 2 x a day  Reduce meals out to 2 per week

## 2024-02-04 ENCOUNTER — Other Ambulatory Visit (HOSPITAL_COMMUNITY): Payer: Self-pay

## 2024-02-04 ENCOUNTER — Other Ambulatory Visit: Payer: Self-pay

## 2024-02-04 DIAGNOSIS — M5032 Other cervical disc degeneration, mid-cervical region, unspecified level: Secondary | ICD-10-CM | POA: Diagnosis not present

## 2024-02-04 DIAGNOSIS — M9903 Segmental and somatic dysfunction of lumbar region: Secondary | ICD-10-CM | POA: Diagnosis not present

## 2024-02-04 DIAGNOSIS — M9901 Segmental and somatic dysfunction of cervical region: Secondary | ICD-10-CM | POA: Diagnosis not present

## 2024-02-04 DIAGNOSIS — M62830 Muscle spasm of back: Secondary | ICD-10-CM | POA: Diagnosis not present

## 2024-02-05 ENCOUNTER — Other Ambulatory Visit: Payer: Self-pay

## 2024-02-05 DIAGNOSIS — M9901 Segmental and somatic dysfunction of cervical region: Secondary | ICD-10-CM | POA: Diagnosis not present

## 2024-02-05 DIAGNOSIS — M5032 Other cervical disc degeneration, mid-cervical region, unspecified level: Secondary | ICD-10-CM | POA: Diagnosis not present

## 2024-02-05 DIAGNOSIS — M9903 Segmental and somatic dysfunction of lumbar region: Secondary | ICD-10-CM | POA: Diagnosis not present

## 2024-02-05 DIAGNOSIS — M62830 Muscle spasm of back: Secondary | ICD-10-CM | POA: Diagnosis not present

## 2024-02-10 DIAGNOSIS — M62830 Muscle spasm of back: Secondary | ICD-10-CM | POA: Diagnosis not present

## 2024-02-10 DIAGNOSIS — M9901 Segmental and somatic dysfunction of cervical region: Secondary | ICD-10-CM | POA: Diagnosis not present

## 2024-02-10 DIAGNOSIS — M5032 Other cervical disc degeneration, mid-cervical region, unspecified level: Secondary | ICD-10-CM | POA: Diagnosis not present

## 2024-02-10 DIAGNOSIS — M9903 Segmental and somatic dysfunction of lumbar region: Secondary | ICD-10-CM | POA: Diagnosis not present

## 2024-02-11 DIAGNOSIS — M9903 Segmental and somatic dysfunction of lumbar region: Secondary | ICD-10-CM | POA: Diagnosis not present

## 2024-02-11 DIAGNOSIS — M62830 Muscle spasm of back: Secondary | ICD-10-CM | POA: Diagnosis not present

## 2024-02-11 DIAGNOSIS — M9901 Segmental and somatic dysfunction of cervical region: Secondary | ICD-10-CM | POA: Diagnosis not present

## 2024-02-11 DIAGNOSIS — M5032 Other cervical disc degeneration, mid-cervical region, unspecified level: Secondary | ICD-10-CM | POA: Diagnosis not present

## 2024-02-17 DIAGNOSIS — M62830 Muscle spasm of back: Secondary | ICD-10-CM | POA: Diagnosis not present

## 2024-02-17 DIAGNOSIS — M5032 Other cervical disc degeneration, mid-cervical region, unspecified level: Secondary | ICD-10-CM | POA: Diagnosis not present

## 2024-02-17 DIAGNOSIS — M9903 Segmental and somatic dysfunction of lumbar region: Secondary | ICD-10-CM | POA: Diagnosis not present

## 2024-02-17 DIAGNOSIS — M9901 Segmental and somatic dysfunction of cervical region: Secondary | ICD-10-CM | POA: Diagnosis not present

## 2024-02-20 DIAGNOSIS — M5032 Other cervical disc degeneration, mid-cervical region, unspecified level: Secondary | ICD-10-CM | POA: Diagnosis not present

## 2024-02-20 DIAGNOSIS — M62830 Muscle spasm of back: Secondary | ICD-10-CM | POA: Diagnosis not present

## 2024-02-20 DIAGNOSIS — M9903 Segmental and somatic dysfunction of lumbar region: Secondary | ICD-10-CM | POA: Diagnosis not present

## 2024-02-20 DIAGNOSIS — M9901 Segmental and somatic dysfunction of cervical region: Secondary | ICD-10-CM | POA: Diagnosis not present

## 2024-02-24 DIAGNOSIS — M62830 Muscle spasm of back: Secondary | ICD-10-CM | POA: Diagnosis not present

## 2024-02-24 DIAGNOSIS — F419 Anxiety disorder, unspecified: Secondary | ICD-10-CM | POA: Diagnosis not present

## 2024-02-24 DIAGNOSIS — M5032 Other cervical disc degeneration, mid-cervical region, unspecified level: Secondary | ICD-10-CM | POA: Diagnosis not present

## 2024-02-24 DIAGNOSIS — M9903 Segmental and somatic dysfunction of lumbar region: Secondary | ICD-10-CM | POA: Diagnosis not present

## 2024-02-24 DIAGNOSIS — M9901 Segmental and somatic dysfunction of cervical region: Secondary | ICD-10-CM | POA: Diagnosis not present

## 2024-02-27 DIAGNOSIS — M9901 Segmental and somatic dysfunction of cervical region: Secondary | ICD-10-CM | POA: Diagnosis not present

## 2024-02-27 DIAGNOSIS — M9903 Segmental and somatic dysfunction of lumbar region: Secondary | ICD-10-CM | POA: Diagnosis not present

## 2024-02-27 DIAGNOSIS — M5032 Other cervical disc degeneration, mid-cervical region, unspecified level: Secondary | ICD-10-CM | POA: Diagnosis not present

## 2024-02-27 DIAGNOSIS — M62830 Muscle spasm of back: Secondary | ICD-10-CM | POA: Diagnosis not present

## 2024-03-01 ENCOUNTER — Other Ambulatory Visit: Payer: Self-pay

## 2024-03-01 DIAGNOSIS — F419 Anxiety disorder, unspecified: Secondary | ICD-10-CM | POA: Diagnosis not present

## 2024-03-03 ENCOUNTER — Encounter: Payer: Self-pay | Admitting: Nurse Practitioner

## 2024-03-03 ENCOUNTER — Ambulatory Visit: Admitting: Nurse Practitioner

## 2024-03-03 VITALS — BP 142/85 | HR 69 | Temp 98.2°F | Ht 64.0 in | Wt 204.0 lb

## 2024-03-03 DIAGNOSIS — R632 Polyphagia: Secondary | ICD-10-CM

## 2024-03-03 DIAGNOSIS — Z6835 Body mass index (BMI) 35.0-35.9, adult: Secondary | ICD-10-CM

## 2024-03-03 DIAGNOSIS — E88819 Insulin resistance, unspecified: Secondary | ICD-10-CM | POA: Diagnosis not present

## 2024-03-03 DIAGNOSIS — M5032 Other cervical disc degeneration, mid-cervical region, unspecified level: Secondary | ICD-10-CM | POA: Diagnosis not present

## 2024-03-03 DIAGNOSIS — E66812 Obesity, class 2: Secondary | ICD-10-CM

## 2024-03-03 DIAGNOSIS — M9901 Segmental and somatic dysfunction of cervical region: Secondary | ICD-10-CM | POA: Diagnosis not present

## 2024-03-03 DIAGNOSIS — M9903 Segmental and somatic dysfunction of lumbar region: Secondary | ICD-10-CM | POA: Diagnosis not present

## 2024-03-03 DIAGNOSIS — M62830 Muscle spasm of back: Secondary | ICD-10-CM | POA: Diagnosis not present

## 2024-03-03 NOTE — Progress Notes (Unsigned)
 Office: [phone]  /  Fax: [phone]  WEIGHT SUMMARY AND BIOMETRICS  Weight Lost Since Last Visit: 1lb  Weight Gained Since Last Visit: 0lb   Vitals Temp: 98.2 F (36.8 C) BP: (!) 142/85 Pulse Rate: 69 SpO2: 96 %   Anthropometric Measurements Height: 5\' 4"  (1.626 m) Weight: 204 lb (92.5 kg) BMI (Calculated): 35 Weight at Last Visit: 205lb Weight Lost Since Last Visit: 1lb Weight Gained Since Last Visit: 0lb Starting Weight: 234lb Total Weight Loss (lbs): 30 lb (13.6 kg)   Body Composition  Body Fat %: 43.5 % Fat Mass (lbs): 89 lbs Muscle Mass (lbs): 110 lbs Total Body Water  (lbs): 87.8 lbs Visceral Fat Rating : 11   Other Clinical Data Fasting: No Labs: No Today's Visit #: 15 Starting Date: 11/18/22     HPI  Chief Complaint: OBESITY  Stephanie Long is here to discuss her progress with her obesity treatment plan. She is on the the Category 2 Plan and states she is following her eating plan approximately 50 % of the time. She states she is exercising 30 minutes 4-5 days per week.   Interval History:  Since last office visit she has lost 1 pound.  Reports it has been a busy month due to work and home life.  She doesn't feel that the stress will get better any time soon.  She is drinking protein shake, water , coffee and occ G2 or unsweetened tea. She is walking her dog to stay active.  She hasn't been able to go the gym over the past couple weeks. She feels with summer time she will be able to be more active.     Pharmacotherapy for weight loss: She is currently taking Contrave  2 po BID for medical weight loss.  Denies side effects.    The authorization is effective from 09/22/2023 to 09/22/2024    Previous pharmacotherapy for medical weight loss:  Metformin    Bariatric surgery:  Patient has not had bariatric surgery.   PHYSICAL EXAM:  Blood pressure (!) 142/85, pulse 69, temperature 98.2 F (36.8 C), height 5\' 4"  (1.626 m), weight 204 lb (92.5 kg),  SpO2 96%. Body mass index is 35.02 kg/m.  General: She is overweight, cooperative, alert, well developed, and in no acute distress. PSYCH: Has normal mood, affect and thought process.   Extremities: No edema.  Neurologic: No gross sensory or motor deficits. No tremors or fasciculations noted.    DIAGNOSTIC DATA REVIEWED:  BMET    Component Value Date/Time   NA 140 01/06/2024 0807   K 4.1 01/06/2024 0807   CL 100 01/06/2024 0807   CO2 26 01/06/2024 0807   GLUCOSE 91 01/06/2024 0807   GLUCOSE 80 09/27/2021 1458   BUN 14 01/06/2024 0807   CREATININE 0.85 01/06/2024 0807   CALCIUM 9.1 01/06/2024 0807   GFRNONAA >60 06/01/2018 0955   GFRAA >60 06/01/2018 0955   Lab Results  Component Value Date   HGBA1C 5.3 01/06/2024   HGBA1C 5.5 03/02/2018   Lab Results  Component Value Date   INSULIN  20.6 01/06/2024   INSULIN  27.7 (H) 11/18/2022   Lab Results  Component Value Date   TSH 2.800 01/06/2024   CBC    Component Value Date/Time   WBC 6.6 04/10/2023 0823   WBC 9.5 09/27/2021 1458   RBC 4.77 04/10/2023 0823   RBC 4.64 09/27/2021 1458   HGB 14.2 04/10/2023 0823   HCT 42.3 04/10/2023 0823   PLT 262 04/10/2023 0823   MCV 89 04/10/2023 0823  MCH 29.8 04/10/2023 0823   MCH 27.8 06/01/2018 0955   MCHC 33.6 04/10/2023 0823   MCHC 33.6 09/27/2021 1458   RDW 13.2 04/10/2023 0823   Iron Studies No results found for: "IRON", "TIBC", "FERRITIN", "IRONPCTSAT" Lipid Panel     Component Value Date/Time   CHOL 144 01/06/2024 0807   TRIG 68 01/06/2024 0807   HDL 51 01/06/2024 0807   CHOLHDL 3 09/27/2021 1458   VLDL 15.2 09/27/2021 1458   LDLCALC 79 01/06/2024 0807   Hepatic Function Panel     Component Value Date/Time   PROT 6.9 01/06/2024 0807   ALBUMIN 4.4 01/06/2024 0807   AST 17 01/06/2024 0807   ALT 13 01/06/2024 0807   ALKPHOS 83 01/06/2024 0807   BILITOT 0.8 01/06/2024 0807      Component Value Date/Time   TSH 2.800 01/06/2024 0807   Nutritional Lab  Results  Component Value Date   VD25OH 44.9 01/06/2024   VD25OH 76.6 04/10/2023   VD25OH 28.6 (L) 11/18/2022     ASSESSMENT AND PLAN  TREATMENT PLAN FOR OBESITY:  Recommended Dietary Goals  Stephanie Long is currently in the action stage of change. As such, her goal is to continue weight management plan. She has agreed to {MWMwtlossportion/plan2:23431}.  Behavioral Intervention  We discussed the following Behavioral Modification Strategies today: {EMWMwtlossstrategies:28914::"continue to work on maintaining a reduced calorie state, getting the recommended amount of protein, incorporating whole foods, making healthy choices, staying well hydrated and practicing mindfulness when eating."}.  Additional resources provided today: NA  Recommended Physical Activity Goals  Stephanie Long has been advised to work up to 150 minutes of moderate intensity aerobic activity a week and strengthening exercises 2-3 times per week for cardiovascular health, weight loss maintenance and preservation of muscle mass.   She has agreed to {EMEXERCISE:28847::"Think about enjoyable ways to increase daily physical activity and overcoming barriers to exercise","Increase physical activity in their day and reduce sedentary time (increase NEAT)."}   Pharmacotherapy We discussed various medication options to help Stephanie Long with her weight loss efforts and we both agreed to ***.  ASSOCIATED CONDITIONS ADDRESSED TODAY  Action/Plan  Polyphagia  Class 2 severe obesity due to excess calories with serious comorbidity and body mass index (BMI) of 35.0 to 35.9 in adult [facility])         Return in about 4 weeks (around 03/31/2024).Stephanie Long She was informed of the importance of frequent follow up visits to maximize her success with intensive lifestyle modifications for her multiple health conditions.   ATTESTASTION STATEMENTS:  Reviewed by clinician on day of visit: allergies, medications, problem list, medical history, surgical  history, family history, social history, and previous encounter notes.   Time spent on visit including pre-visit chart review and post-visit care and charting was *** minutes.    Crist Dominion. Amire Gossen FNP-C

## 2024-03-05 ENCOUNTER — Other Ambulatory Visit: Payer: Self-pay

## 2024-03-08 ENCOUNTER — Other Ambulatory Visit: Payer: Self-pay

## 2024-03-08 ENCOUNTER — Other Ambulatory Visit: Payer: Self-pay | Admitting: Pharmacy Technician

## 2024-03-08 NOTE — Progress Notes (Signed)
 Specialty Pharmacy Refill Coordination Note  Stephanie Long is a 48 y.o. female contacted today regarding refills of specialty medication(s) Dupilumab  (Dupixent )   Patient requested Delivery   Delivery date: 03/10/24   Verified address: [address]  [place]   Medication will be filled on 03/09/24.

## 2024-03-09 ENCOUNTER — Other Ambulatory Visit (HOSPITAL_COMMUNITY): Payer: Self-pay

## 2024-03-09 ENCOUNTER — Other Ambulatory Visit: Payer: Self-pay

## 2024-03-09 NOTE — Progress Notes (Signed)
 Pharmacy Patient Advocate Encounter   Received notification from Patient Pharmacy that prior authorization for Dupixent  is required/requested.   Insurance verification completed.   The patient is insured through [facility] .   Per test claim: PA required; PA submitted to above mentioned insurance via CoverMyMeds Key/confirmation #/EOC [id number] Status is pending

## 2024-03-09 NOTE — Progress Notes (Signed)
PA submitted and pt notified

## 2024-03-10 ENCOUNTER — Other Ambulatory Visit: Payer: Self-pay

## 2024-03-10 DIAGNOSIS — F419 Anxiety disorder, unspecified: Secondary | ICD-10-CM | POA: Diagnosis not present

## 2024-03-10 DIAGNOSIS — Z1331 Encounter for screening for depression: Secondary | ICD-10-CM | POA: Diagnosis not present

## 2024-03-10 DIAGNOSIS — N6311 Unspecified lump in the right breast, upper outer quadrant: Secondary | ICD-10-CM | POA: Diagnosis not present

## 2024-03-10 DIAGNOSIS — T8332XA Displacement of intrauterine contraceptive device, initial encounter: Secondary | ICD-10-CM | POA: Diagnosis not present

## 2024-03-10 NOTE — Progress Notes (Signed)
 PA approved and fill initiated.

## 2024-03-10 NOTE — Progress Notes (Signed)
 Pharmacy Patient Advocate Encounter  Received notification from [facility] that Prior Authorization for Dupixent  has been APPROVED from 03/09/24 to 03/08/25   PA #/Case ID/Reference #: [id number]

## 2024-03-12 DIAGNOSIS — M62830 Muscle spasm of back: Secondary | ICD-10-CM | POA: Diagnosis not present

## 2024-03-12 DIAGNOSIS — M5032 Other cervical disc degeneration, mid-cervical region, unspecified level: Secondary | ICD-10-CM | POA: Diagnosis not present

## 2024-03-12 DIAGNOSIS — M9901 Segmental and somatic dysfunction of cervical region: Secondary | ICD-10-CM | POA: Diagnosis not present

## 2024-03-12 DIAGNOSIS — M9903 Segmental and somatic dysfunction of lumbar region: Secondary | ICD-10-CM | POA: Diagnosis not present

## 2024-03-19 ENCOUNTER — Encounter: Payer: Self-pay | Admitting: Gastroenterology

## 2024-03-19 ENCOUNTER — Encounter: Payer: Self-pay | Admitting: Nurse Practitioner

## 2024-03-19 DIAGNOSIS — M62830 Muscle spasm of back: Secondary | ICD-10-CM | POA: Diagnosis not present

## 2024-03-19 DIAGNOSIS — M5032 Other cervical disc degeneration, mid-cervical region, unspecified level: Secondary | ICD-10-CM | POA: Diagnosis not present

## 2024-03-19 DIAGNOSIS — M9903 Segmental and somatic dysfunction of lumbar region: Secondary | ICD-10-CM | POA: Diagnosis not present

## 2024-03-19 DIAGNOSIS — M9901 Segmental and somatic dysfunction of cervical region: Secondary | ICD-10-CM | POA: Diagnosis not present

## 2024-03-23 ENCOUNTER — Other Ambulatory Visit: Payer: Self-pay | Admitting: Nurse Practitioner

## 2024-03-23 ENCOUNTER — Other Ambulatory Visit: Payer: Self-pay | Admitting: Family Medicine

## 2024-03-23 DIAGNOSIS — R632 Polyphagia: Secondary | ICD-10-CM

## 2024-03-23 DIAGNOSIS — R7303 Prediabetes: Secondary | ICD-10-CM

## 2024-03-23 NOTE — Telephone Encounter (Signed)
 LAST APPOINTMENT DATE: 03/03/24 NEXT APPOINTMENT DATE: 04/08/24   [place] [facility] [address] [place] [address] Phone: [phone] Fax: [phone]  [id number] [place], [place] - [address] AT [facility] [address] [facility] [address] [facility] [address] [place] [address] Phone: [phone] Fax: [phone]  [facility] #[id number] - [place], [place] - [address] AT [address] [place] [address] Phone: [phone] Fax: [phone]  Patient is requesting a refill of the following medications: No prescriptions requested or ordered in this encounter   Date last filled: 02/03/24 Previously prescribed by Dr Ambrosio Junker  Lab Results      Component                Value               Date                      HGBA1C                   5.3                 01/06/2024                HGBA1C                   5.5                 04/10/2023                HGBA1C                   5.8 (H)             11/18/2022           Lab Results      Component                Value               Date                      LDLCALC                  79                  01/06/2024                CREATININE               0.85                01/06/2024           Lab Results      Component                Value               Date                      VD25OH                   44.9                01/06/2024                VD25OH  76.6                04/10/2023                VD25OH                   28.6 (L)            11/18/2022            BP Readings from Last 3 Encounters: 03/03/24 : (!) 142/85 02/03/24 : (!) 143/86 01/06/24 : 115/78

## 2024-03-24 ENCOUNTER — Other Ambulatory Visit: Payer: Self-pay

## 2024-03-24 DIAGNOSIS — Z3202 Encounter for pregnancy test, result negative: Secondary | ICD-10-CM | POA: Diagnosis not present

## 2024-03-24 DIAGNOSIS — Z30433 Encounter for removal and reinsertion of intrauterine contraceptive device: Secondary | ICD-10-CM | POA: Diagnosis not present

## 2024-03-25 DIAGNOSIS — F419 Anxiety disorder, unspecified: Secondary | ICD-10-CM | POA: Diagnosis not present

## 2024-03-29 ENCOUNTER — Other Ambulatory Visit (HOSPITAL_COMMUNITY): Payer: Self-pay

## 2024-03-29 ENCOUNTER — Other Ambulatory Visit: Payer: Self-pay

## 2024-03-29 ENCOUNTER — Other Ambulatory Visit: Payer: Self-pay | Admitting: Family Medicine

## 2024-03-29 ENCOUNTER — Other Ambulatory Visit: Payer: Self-pay | Admitting: Nurse Practitioner

## 2024-03-29 DIAGNOSIS — E66812 Morbid (severe) obesity due to excess calories: Secondary | ICD-10-CM

## 2024-03-29 DIAGNOSIS — R632 Polyphagia: Secondary | ICD-10-CM

## 2024-03-29 DIAGNOSIS — R7303 Prediabetes: Secondary | ICD-10-CM

## 2024-03-29 MED ORDER — CONTRAVE 8-90 MG PO TB12
2.0000 | ORAL_TABLET | Freq: Two times a day (BID) | ORAL | 0 refills | Status: DC
  Filled 2024-03-29: qty 120, 30d supply, fill #0

## 2024-03-29 MED ORDER — METFORMIN HCL 500 MG PO TABS
500.0000 mg | ORAL_TABLET | Freq: Two times a day (BID) | ORAL | 0 refills | Status: DC
Start: 2024-03-29 — End: 2024-07-14
  Filled 2024-03-29: qty 60, 30d supply, fill #0

## 2024-03-31 ENCOUNTER — Ambulatory Visit: Admitting: Nurse Practitioner

## 2024-03-31 DIAGNOSIS — F419 Anxiety disorder, unspecified: Secondary | ICD-10-CM | POA: Diagnosis not present

## 2024-04-01 ENCOUNTER — Other Ambulatory Visit: Payer: Self-pay

## 2024-04-07 ENCOUNTER — Other Ambulatory Visit: Payer: Self-pay

## 2024-04-07 ENCOUNTER — Other Ambulatory Visit: Payer: Self-pay | Admitting: Pharmacy Technician

## 2024-04-07 NOTE — Progress Notes (Signed)
 Specialty Pharmacy Refill Coordination Note  Stephanie Long is a 48 y.o. female contacted today regarding refills of specialty medication(s) Dupilumab  (Dupixent )   Patient requested Delivery   Delivery date: 04/08/24   Verified address: [address]  [place]   Medication will be filled on 04/07/24.

## 2024-04-08 ENCOUNTER — Ambulatory Visit: Admitting: Nurse Practitioner

## 2024-04-12 DIAGNOSIS — F419 Anxiety disorder, unspecified: Secondary | ICD-10-CM | POA: Diagnosis not present

## 2024-04-16 ENCOUNTER — Other Ambulatory Visit: Payer: Self-pay

## 2024-04-16 ENCOUNTER — Encounter: Payer: Self-pay | Admitting: Gastroenterology

## 2024-04-16 ENCOUNTER — Ambulatory Visit (AMBULATORY_SURGERY_CENTER)

## 2024-04-16 ENCOUNTER — Telehealth: Payer: Self-pay

## 2024-04-16 ENCOUNTER — Other Ambulatory Visit (HOSPITAL_COMMUNITY): Payer: Self-pay

## 2024-04-16 VITALS — Ht 64.0 in | Wt 204.0 lb

## 2024-04-16 DIAGNOSIS — Z1211 Encounter for screening for malignant neoplasm of colon: Secondary | ICD-10-CM

## 2024-04-16 MED ORDER — NA SULFATE-K SULFATE-MG SULF 17.5-3.13-1.6 GM/177ML PO SOLN
1.0000 | Freq: Once | ORAL | 0 refills | Status: AC
  Filled 2024-04-16: qty 354, 1d supply, fill #0

## 2024-04-16 NOTE — Telephone Encounter (Signed)
 Patient disclosed during pre visit that she is taking Contrave .  She would like to remain on it because she states that it has her Wellbutrin  in it.  Pease advise if she will need to hold this for 10 days before her colonoscopy on 05/05/24.

## 2024-04-16 NOTE — Progress Notes (Signed)

## 2024-04-20 NOTE — Telephone Encounter (Signed)
 Unable to reach patient.  Left a voicemail for the patient to continue on the Contrave  without a hold prior to colonoscopy per Norleen Schillings.  See note

## 2024-04-22 DIAGNOSIS — T8332XA Displacement of intrauterine contraceptive device, initial encounter: Secondary | ICD-10-CM | POA: Diagnosis not present

## 2024-04-23 ENCOUNTER — Other Ambulatory Visit: Payer: Self-pay | Admitting: Obstetrics

## 2024-04-23 DIAGNOSIS — N6311 Unspecified lump in the right breast, upper outer quadrant: Secondary | ICD-10-CM

## 2024-04-29 DIAGNOSIS — F419 Anxiety disorder, unspecified: Secondary | ICD-10-CM | POA: Diagnosis not present

## 2024-05-03 ENCOUNTER — Other Ambulatory Visit: Payer: Self-pay

## 2024-05-03 ENCOUNTER — Other Ambulatory Visit (HOSPITAL_COMMUNITY): Payer: Self-pay

## 2024-05-04 ENCOUNTER — Other Ambulatory Visit: Payer: Self-pay | Admitting: Obstetrics

## 2024-05-04 ENCOUNTER — Ambulatory Visit
Admission: RE | Admit: 2024-05-04 | Discharge: 2024-05-04 | Disposition: A | Discharge status: Home / Self Care | Source: Ambulatory Visit | Attending: Obstetrics | Admitting: Obstetrics

## 2024-05-04 DIAGNOSIS — R928 Other abnormal and inconclusive findings on diagnostic imaging of breast: Secondary | ICD-10-CM | POA: Diagnosis not present

## 2024-05-04 DIAGNOSIS — N6311 Unspecified lump in the right breast, upper outer quadrant: Secondary | ICD-10-CM

## 2024-05-04 DIAGNOSIS — N6310 Unspecified lump in the right breast, unspecified quadrant: Secondary | ICD-10-CM

## 2024-05-05 ENCOUNTER — Encounter: Payer: Self-pay | Admitting: Gastroenterology

## 2024-05-05 ENCOUNTER — Other Ambulatory Visit: Payer: Self-pay

## 2024-05-05 ENCOUNTER — Ambulatory Visit: Admitting: Gastroenterology

## 2024-05-05 VITALS — BP 115/72 | HR 67 | Temp 98.2°F | Resp 15 | Ht 64.0 in | Wt 204.0 lb

## 2024-05-05 DIAGNOSIS — F32A Depression, unspecified: Secondary | ICD-10-CM | POA: Diagnosis not present

## 2024-05-05 DIAGNOSIS — D125 Benign neoplasm of sigmoid colon: Secondary | ICD-10-CM | POA: Diagnosis not present

## 2024-05-05 DIAGNOSIS — K5730 Diverticulosis of large intestine without perforation or abscess without bleeding: Secondary | ICD-10-CM | POA: Diagnosis not present

## 2024-05-05 DIAGNOSIS — K635 Polyp of colon: Secondary | ICD-10-CM

## 2024-05-05 DIAGNOSIS — Z1211 Encounter for screening for malignant neoplasm of colon: Secondary | ICD-10-CM

## 2024-05-05 DIAGNOSIS — F419 Anxiety disorder, unspecified: Secondary | ICD-10-CM | POA: Diagnosis not present

## 2024-05-05 MED ORDER — SODIUM CHLORIDE 0.9 % IV SOLN: 500.0000 mL | INTRAVENOUS | Status: AC

## 2024-05-05 NOTE — Patient Instructions (Signed)
 YOU HAD AN ENDOSCOPIC PROCEDURE TODAY AT THE [facility]:   Refer to the procedure report that was given to you for any specific questions about what was found during the examination.  If the procedure report does not answer your questions, please call your gastroenterologist to clarify.  If you requested that your care partner not be given the details of your procedure findings, then the procedure report has been included in a sealed envelope for you to review at your convenience later.  YOU SHOULD EXPECT: Some feelings of bloating in the abdomen. Passage of more gas than usual.  Walking can help get rid of the air that was put into your GI tract during the procedure and reduce the bloating. If you had a lower endoscopy (such as a colonoscopy or flexible sigmoidoscopy) you may notice spotting of blood in your stool or on the toilet paper. If you underwent a bowel prep for your procedure, you may not have a normal bowel movement for a few days.  Please Note:  You might notice some irritation and congestion in your nose or some drainage.  This is from the oxygen used during your procedure.  There is no need for concern and it should clear up in a day or so.  SYMPTOMS TO REPORT IMMEDIATELY:  Following lower endoscopy (colonoscopy or flexible sigmoidoscopy):  Excessive amounts of blood in the stool  Significant tenderness or worsening of abdominal pains  Swelling of the abdomen that is new, acute  Fever of 100F or higher   For urgent or emergent issues, a gastroenterologist can be reached at any hour by calling [phone]. Do not use MyChart messaging for urgent concerns.    DIET:  We do recommend a small meal at first, but then you may proceed to your regular diet.  Drink plenty of fluids but you should avoid alcoholic beverages for 24 hours.  MEDICATIONS: Continue present medications.  FOLLOW UP: Await pathology results. Repeat colonoscopy in 5-10 years for surveillance based  on pathology results. Return to GI office as needed.  Please see handouts given to you by your recovery nurse: Polyps, Diverticulosis.  Thank you for allowing us  to provide for your healthcare needs today.  ACTIVITY:  You should plan to take it easy for the rest of today and you should NOT DRIVE or use heavy machinery until tomorrow (because of the sedation medicines used during the test).    FOLLOW UP: Our staff will call the number listed on your records the next business day following your procedure.  We will call around 7:15- 8:00 am to check on you and address any questions or concerns that you may have regarding the information given to you following your procedure. If we do not reach you, we will leave a message.     If any biopsies were taken you will be contacted by phone or by letter within the next 1-3 weeks.  Please call us  at [phone] if you have not heard about the biopsies in 3 weeks.    SIGNATURES/CONFIDENTIALITY: You and/or your care partner have signed paperwork which will be entered into your electronic medical record.  These signatures attest to the fact that that the information above on your After Visit Summary has been reviewed and is understood.  Full responsibility of the confidentiality of this discharge information lies with you and/or your care-partner.

## 2024-05-05 NOTE — Progress Notes (Signed)
 Called to room to assist during endoscopic procedure.  Patient ID and intended procedure confirmed with present staff. Received instructions for my participation in the procedure from the performing physician.

## 2024-05-05 NOTE — Progress Notes (Signed)
 GASTROENTEROLOGY PROCEDURE H&P NOTE   Primary Care Physician: Watt Harlene BROCKS, MD    Reason for Procedure:  Colon Cancer screening  Plan:    Colonoscopy  Patient is appropriate for endoscopic procedure(s) in the ambulatory ([facility]) setting.  The nature of the procedure, as well as the risks, benefits, and alternatives were carefully and thoroughly reviewed with the patient. Ample time for discussion and questions allowed. The patient understood, was satisfied, and agreed to proceed.     HPI: Stephanie Long is a 48 y.o. female who presents for colonoscopy for routine Colon Cancer screening.  No active GI symptoms.  No known family history of colon cancer or related malignancy.  Patient is otherwise without complaints or active issues today.  Past Medical History:  Diagnosis Date   Allergy    Anxiety    Asthma    Back pain    Depression    GERD (gastroesophageal reflux disease)    Gestational diabetes    Gestational hypertension 05/09/2013   Hx of varicella    Joint pain    Osteoarthritis    PCOS (polycystic ovarian syndrome) 2012   Plantar fasciitis    Symptomatic cholelithiasis     Past Surgical History:  Procedure Laterality Date   CESAREAN SECTION N/A 05/09/2013   Procedure: primary cesarean section with delivery of baby girl at 1800.  Apgars 9/9.;  Surgeon: Charlie JINNY Flowers, MD;  Location: [facility];  Service: Obstetrics;  Laterality: N/A;   CHOLECYSTECTOMY N/A 06/08/2018   Procedure: LAPAROSCOPIC CHOLECYSTECTOMY;  Surgeon: Sebastian Moles, MD;  Location: [facility];  Service: General;  Laterality: N/A;   KNEE ARTHROSCOPY Right 10/11/2021   Procedure: RIGHT KNEE ARTHROSCOPY WITH PARTIAL MEDIAL MENISCECTOMY;  Surgeon: Vernetta Lonni GRADE, MD;  Location: [facility];  Service: Orthopedics;  Laterality: Right;   WISDOM TOOTH EXTRACTION     WISDOM TOOTH EXTRACTION      Prior to Admission medications   Medication Sig Start Date End Date Taking?  Authorizing Provider  Acetaminophen  (TYLENOL  PO) Take 1 tablet by mouth as needed.   Yes [provider]  amLODipine  (NORVASC ) 2.5 MG tablet Take 1 tablet (2.5 mg total) by mouth daily. 10/06/23  Yes Copland, Jessica C, MD  fexofenadine (ALLEGRA) 60 MG tablet Take 60 mg by mouth 2 (two) times daily.   Yes [provider]  hydrochlorothiazide  (HYDRODIURIL ) 25 MG tablet Take 1 tablet (25 mg total) by mouth daily. 10/06/23  Yes Copland, Harlene BROCKS, MD  metFORMIN  (GLUCOPHAGE ) 500 MG tablet Take 1 tablet (500 mg total) by mouth 2 (two) times daily with a meal. 03/29/24  Yes Becki Krabbe, FNP  Multiple Vitamin (MULTIVITAMIN) tablet Take 1 tablet by mouth daily.   Yes [provider]  Naltrexone -buPROPion  HCl ER (CONTRAVE ) 8-90 MG TB12 Take 2 tablets by mouth 2 (two) times daily. 03/29/24  Yes Becki Krabbe, FNP  ASHWAGANDHA PO Take by mouth. Patient not taking: No sig reported    [provider]  calcium carbonate (TUMS - DOSED IN MG ELEMENTAL CALCIUM) 500 MG chewable tablet Chew 1 tablet by mouth daily as needed for indigestion or heartburn. Patient not taking: No sig reported    [provider]  clobetasol  cream (TEMOVATE ) 0.05 % Apply at night to eczema up to 4-6 weeks as needed. Not safe for long term use. Patient not taking: No sig reported 07/25/22     DUPIXENT  300 MG/2ML SOAJ inject one pen every two weeks 07/10/23   Jegede, Olugbemiga E,  MD  fluticasone  (FLOVENT  HFA) 110 MCG/ACT inhaler Inhale 1 puff into the lungs in the morning and at bedtime. 09/27/21   Copland, Harlene BROCKS, MD  hydrOXYzine  (VISTARIL ) 25 MG capsule Take 1 capsule (25 mg total) by mouth every 8 (eight) hours as needed for anxiety. 09/09/23   Copland, Harlene BROCKS, MD  IBUPROFEN  PO Take 1 tablet by mouth as needed.    [provider]  MAGNESIUM CITRATE PO Take by mouth.    [provider]  Melatonin 5 MG TABS Take by mouth. Patient not taking: No sig reported     [provider]  triamcinolone  ointment (KENALOG ) 0.5 % Apply topically 2 times daily as needed for eczema on hands Patient not taking: No sig reported 01/21/22   Copland, Harlene BROCKS, MD  VENTOLIN  HFA 108 (90 Base) MCG/ACT inhaler INHALE 2 PUFFS INTO THE LUNGS EVERY 6 HOURS AS NEEDED FOR WHEEZING OR SHORTNESS OF BREATH. Patient not taking: No sig reported 03/24/19   Copland, Harlene BROCKS, MD  VITAMIN D  PO Take by mouth. Patient not taking: No sig reported    [provider]    Current Outpatient Medications  Medication Sig Dispense Refill   Acetaminophen  (TYLENOL  PO) Take 1 tablet by mouth as needed.     amLODipine  (NORVASC ) 2.5 MG tablet Take 1 tablet (2.5 mg total) by mouth daily. 90 tablet 3   fexofenadine (ALLEGRA) 60 MG tablet Take 60 mg by mouth 2 (two) times daily.     hydrochlorothiazide  (HYDRODIURIL ) 25 MG tablet Take 1 tablet (25 mg total) by mouth daily. 90 tablet 3   metFORMIN  (GLUCOPHAGE ) 500 MG tablet Take 1 tablet (500 mg total) by mouth 2 (two) times daily with a meal. 60 tablet 0   Multiple Vitamin (MULTIVITAMIN) tablet Take 1 tablet by mouth daily.     Naltrexone -buPROPion  HCl ER (CONTRAVE ) 8-90 MG TB12 Take 2 tablets by mouth 2 (two) times daily. 120 tablet 0   ASHWAGANDHA PO Take by mouth. (Patient not taking: No sig reported)     calcium carbonate (TUMS - DOSED IN MG ELEMENTAL CALCIUM) 500 MG chewable tablet Chew 1 tablet by mouth daily as needed for indigestion or heartburn. (Patient not taking: No sig reported)     clobetasol  cream (TEMOVATE ) 0.05 % Apply at night to eczema up to 4-6 weeks as needed. Not safe for long term use. (Patient not taking: No sig reported) 60 g 1   DUPIXENT  300 MG/2ML SOAJ inject one pen every two weeks 4 mL 10   fluticasone  (FLOVENT  HFA) 110 MCG/ACT inhaler Inhale 1 puff into the lungs in the morning and at bedtime. 12 g 12   hydrOXYzine  (VISTARIL ) 25 MG capsule Take 1 capsule (25 mg total) by mouth every 8 (eight) hours as needed  for anxiety. 30 capsule 0   IBUPROFEN  PO Take 1 tablet by mouth as needed.     MAGNESIUM CITRATE PO Take by mouth.     Melatonin 5 MG TABS Take by mouth. (Patient not taking: No sig reported)     triamcinolone  ointment (KENALOG ) 0.5 % Apply topically 2 times daily as needed for eczema on hands (Patient not taking: No sig reported) 45 g 2   VENTOLIN  HFA 108 (90 Base) MCG/ACT inhaler INHALE 2 PUFFS INTO THE LUNGS EVERY 6 HOURS AS NEEDED FOR WHEEZING OR SHORTNESS OF BREATH. (Patient not taking: No sig reported) 18 g 6   VITAMIN D  PO Take by mouth. (Patient not taking: No sig reported)  Current Facility-Administered Medications  Medication Dose Route Frequency Provider Last Rate Last Admin   0.9 %  sodium chloride  infusion  500 mL Intravenous Continuous Barclay Lennox V, DO        Allergies as of 05/05/2024   (No Known Allergies)    Family History  Problem Relation Age of Onset   Depression Mother    Hypertension Mother    Diabetes Father    Heart disease Father    Hyperlipidemia Father    Heart attack Father    Hypertension Father    Stroke Maternal Grandmother    Cancer Neg Hx    Asthma Neg Hx    Early death Neg Hx    Colon cancer Neg Hx    Colon polyps Neg Hx    Esophageal cancer Neg Hx    Rectal cancer Neg Hx    Stomach cancer Neg Hx     Social History   Socioeconomic History   Marital status: Married    Spouse name: Not on file   Number of children: 1   Years of education: BSN   Highest education level: Not on file  Occupational History   Not on file  Tobacco Use   Smoking status: Never   Smokeless tobacco: Never  Vaping Use   Vaping status: Never Used  Substance and Sexual Activity   Alcohol use: Yes    Alcohol/week: 2.0 standard drinks of alcohol    Types: 1 Glasses of wine, 1 Standard drinks or equivalent per week   Drug use: No   Sexual activity: Yes    Partners: Male  Other Topics Concern   Not on file  Social History Narrative   Drinks 2  caffeine drinks a day    Social Drivers of Corporate investment banker Strain: Not on file  Food Insecurity: Not on file  Transportation Needs: Not on file  Physical Activity: Not on file  Stress: Not on file  Social Connections: Not on file  Intimate Partner Violence: Not on file    Physical Exam: Vital signs in last 24 hours: @BP  124/85   Pulse (!) 58   Temp 98.2 F (36.8 C)   Ht 5' 4 (1.626 m)   Wt 204 lb (92.5 kg)   SpO2 98%   BMI 35.02 kg/m  GEN: NAD EYE: Sclerae anicteric ENT: MMM CV: Non-tachycardic Pulm: CTA b/l GI: Soft, NT/ND NEURO:  Alert & Oriented x 3   Sandor Flatter, DO [facility]   05/05/2024 11:16 AM

## 2024-05-05 NOTE — Op Note (Signed)
 [facility] Patient Name: Stephanie Long Procedure Date: 05/05/2024 11:11 AM MRN: [id number] Endoscopist: Sandor Flatter , MD, [id number] Age: 48 Referring MD:  Date of Birth: 10-28-1976 Gender: Female Account #: [id number] Procedure:                Colonoscopy Indications:              Screening for colorectal malignant neoplasm, This                            is the patient's first colonoscopy Medicines:                Monitored Anesthesia Care Procedure:                Pre-Anesthesia Assessment:                           - Prior to the procedure, a History and Physical                            was performed, and patient medications and                            allergies were reviewed. The patient's tolerance of                            previous anesthesia was also reviewed. The risks                            and benefits of the procedure and the sedation                            options and risks were discussed with the patient.                            All questions were answered, and informed consent                            was obtained. Prior Anticoagulants: The patient has                            taken no anticoagulant or antiplatelet agents. ASA                            Grade Assessment: II - A patient with mild systemic                            disease. After reviewing the risks and benefits,                            the patient was deemed in satisfactory condition to                            undergo the procedure.  After obtaining informed consent, the colonoscope                            was passed under direct vision. Throughout the                            procedure, the patient's blood pressure, pulse, and                            oxygen saturations were monitored continuously. The                            Olympus Scope SN [id number] was introduced through the                            anus and  advanced to the the terminal ileum. The                            colonoscopy was performed without difficulty. The                            patient tolerated the procedure well. The quality                            of the bowel preparation was good. The terminal                            ileum, ileocecal valve, appendiceal orifice, and                            rectum were photographed. Scope In: 11:23:12 AM Scope Out: 11:41:19 AM Scope Withdrawal Time: 0 hours 12 minutes 1 second  Total Procedure Duration: 0 hours 18 minutes 7 seconds  Findings:                 The perianal and digital rectal examinations were                            normal.                           Two sessile polyps were found in the sigmoid colon.                            The polyps were 3 to 4 mm in size. These polyps                            were removed with a cold snare. Resection and                            retrieval were complete. Estimated blood loss was                            minimal.  A few small-mouthed diverticula were found in the                            sigmoid colon.                           The retroflexed view of the distal rectum and anal                            verge was normal and showed no anal or rectal                            abnormalities.                           The terminal ileum appeared normal. Complications:            No immediate complications. Estimated Blood Loss:     Estimated blood loss was minimal. Impression:               - Two 3 to 4 mm polyps in the sigmoid colon,                            removed with a cold snare. Resected and retrieved.                           - Mild diverticulosis in the sigmoid colon.                           - The distal rectum and anal verge are normal on                            retroflexion view.                           - The examined portion of the ileum was normal. Recommendation:            - Patient has a contact number available for                            emergencies. The signs and symptoms of potential                            delayed complications were discussed with the                            patient. Return to normal activities tomorrow.                            Written discharge instructions were provided to the                            patient.                           - Resume previous diet.                           -  Continue present medications.                           - Await pathology results.                           - Repeat colonoscopy in 5-10 years for surveillance                            based on pathology results.                           - Return to GI office PRN. Sandor Flatter, MD 05/05/2024 11:46:19 AM

## 2024-05-05 NOTE — Progress Notes (Signed)
 Report to PACU, RN, vss, BBS= Clear.

## 2024-05-06 ENCOUNTER — Telehealth: Payer: Self-pay

## 2024-05-06 NOTE — Telephone Encounter (Signed)
 Follow up call to pt, lm for pt to call if having any difficulty with normal activities or eating and drinking.  Also to call if any other questions or concerns.

## 2024-05-07 LAB — SURGICAL PATHOLOGY

## 2024-05-10 ENCOUNTER — Ambulatory Visit: Payer: Self-pay | Admitting: Gastroenterology

## 2024-05-11 ENCOUNTER — Other Ambulatory Visit: Payer: Self-pay

## 2024-05-11 DIAGNOSIS — F419 Anxiety disorder, unspecified: Secondary | ICD-10-CM | POA: Diagnosis not present

## 2024-05-11 NOTE — Progress Notes (Signed)
 Specialty Pharmacy Refill Coordination Note  Stephanie Long is a 48 y.o. female contacted today regarding refills of specialty medication(s) Dupilumab  (Dupixent )   Patient requested Delivery   Delivery date: 05/12/24   Verified address: [address] [place]   Medication will be filled on 05/11/24.

## 2024-05-13 ENCOUNTER — Other Ambulatory Visit: Payer: Self-pay | Admitting: Nurse Practitioner

## 2024-05-13 ENCOUNTER — Other Ambulatory Visit: Payer: Self-pay

## 2024-05-13 DIAGNOSIS — E66812 Obesity, class 2: Secondary | ICD-10-CM

## 2024-05-13 DIAGNOSIS — R632 Polyphagia: Secondary | ICD-10-CM

## 2024-05-17 ENCOUNTER — Encounter: Payer: Self-pay | Admitting: Nurse Practitioner

## 2024-05-17 ENCOUNTER — Other Ambulatory Visit (HOSPITAL_COMMUNITY): Payer: Self-pay

## 2024-05-17 ENCOUNTER — Ambulatory Visit: Admitting: Nurse Practitioner

## 2024-05-17 VITALS — BP 120/81 | HR 56 | Temp 98.1°F | Ht 64.0 in | Wt 206.0 lb

## 2024-05-17 DIAGNOSIS — Z6835 Body mass index (BMI) 35.0-35.9, adult: Secondary | ICD-10-CM

## 2024-05-17 DIAGNOSIS — E66812 Obesity, class 2: Secondary | ICD-10-CM | POA: Diagnosis not present

## 2024-05-17 DIAGNOSIS — R632 Polyphagia: Secondary | ICD-10-CM | POA: Diagnosis not present

## 2024-05-17 MED ORDER — PHENTERMINE-TOPIRAMATE ER 7.5-46 MG PO CP24
ORAL_CAPSULE | ORAL | 0 refills | Status: DC
  Filled 2024-05-17: qty 30, 30d supply, fill #0

## 2024-05-17 NOTE — Patient Instructions (Signed)
 What is Qsymia and how does it work?  Qsymia is a prescription only medicine to help with your weight loss. It is a combination of two medicines that are low dose, long-acting: Phentermine & Topiramate. Qsymia contains low dose Phentermine which is a stimulant medicine that could affect your heart rate and blood pressure Qsymia is designed to help you feel satisfied faster that will help you to decrease portion size. Also, it helps curb late night snacking habits. Some food you usually enjoy may start to taste differently which will help you make healthier food choices.  This medicine will be most effective when combined with a reduced calorie diet and physical activity.  How should I take Qsymia? Take daily in the morning with breakfast. Swallow the extended-release capsule whole. Do not crush, break, or chew it.  If you miss a dose, take it as soon as possible. If it is after 12pm, skip the missed dose and go back to your regular dosing schedule. Do not take extra medicine to make up for the missed dose. You have received two separate prescriptions today. You will initially take a lower dose of 3.75mg /23mg  for 14 days then increase to a higher dose of 7.5mg /46mg  for maintenance for 30 days. There are 4 total dosing options. Your provider will discuss any need to go up to a higher dose during your office visits.  If you are taking Levothyroxine, take the Levothyroxine 1 hours before breakfast and the take the Qsymia 1 hour after breakfast. Do not stop taking Qsymia without talking to your provider. Stopping Qsymia suddenly can cause serious side effects, such as seizures and headaches.   What should I avoid while taking Qsymia? Limit caffeine to 1 small cup daily. Examples are soda, coffee, tea, herbal tea, energy drinks, and chocolate Avoid decongestant medicines like Sudafed, Mucinex-D, and Zyrtec-D. Qsymia may cause you to feel dizzy, drowsy, or confused, or to have trouble thinking  or speaking. Do not drive or do anything else that could be dangerous until you know how this medicine affects you.  Women who can become pregnant: Use effective birth control (contraception) consistently while taking Qsymia. If you miss a menstrual period, STOP Qsymia and call our office immediately. Pregnancy tests will be performed at your appointment if indicated.  What side effects may I notice when taking Qsymia? Side effects that usually do not require medical attention (report to our office if they continue or are bothersome): Dry mouth (drink at least 64 oz of fluid daily) Constipation (you may take over the counter laxative if needed) Metallic taste in your mouth when drinking carbonated beverages Numbness or tingling in the hands, arms, feet or face that lasts more than a week Headache Sudden changes in vision Mental fuzziness (problems with concentration, attention, memory or speech) Trouble sleeping (insomnia) Side effects that you should report to our office as soon as possible: Increases in heart rate and/or palpitations (feeling like your heart is racing or pounding in your chest that lasts several minutes) Chest pain Increased blood pressure Dizziness or feeling faint Shortness of breath Irritability Feeling anxious, agitated, restless, or nervous Depression or severe changes in mood Problems urinating Unusual swelling of the legs Vomiting   Other important information You will be asked to sign an informed consent prior to starting Qysmia Qsymia is a federally controlled substance. Keep Qsymia in a safe place to prevent misuse and abuse. Selling or giving away Qsymia may harm others, and is against the law.  Your prescription  will be sent to the pharmacy during your visit.  Refills will require an office visit. Your insurance may not cover the cost of this medicine. Our office will complete a pre-authorization if required by your insurance.

## 2024-05-17 NOTE — Progress Notes (Signed)
 Office: [phone]  /  Fax: [phone]  WEIGHT SUMMARY AND BIOMETRICS  Weight Lost Since Last Visit: 0  Weight Gained Since Last Visit: 2lb   Vitals Temp: 98.1 F (36.7 C) BP: 120/81 Pulse Rate: (!) 56 SpO2: 97 %   Anthropometric Measurements Height: 5' 4 (1.626 m) Weight: 206 lb (93.4 kg) BMI (Calculated): 35.34 Weight at Last Visit: 204lb Weight Lost Since Last Visit: 0 Weight Gained Since Last Visit: 2lb Starting Weight: 234lb Total Weight Loss (lbs): 28 lb (12.7 kg)   Body Composition  Body Fat %: 45.4 % Fat Mass (lbs): 93.6 lbs Muscle Mass (lbs): 106.8 lbs Total Body Water  (lbs): 86.8 lbs Visceral Fat Rating : 11   Other Clinical Data Fasting: no Labs: no Today's Visit #: 16 Starting Date: 11/18/22     HPI  Chief Complaint: OBESITY  Stephanie Long is here to discuss her progress with her obesity treatment plan. She is on the the Category 2 Plan and states she is following her eating plan approximately 50 % of the time. She states she is exercising 40 minutes 5-6 days per week.   Interval History:  Since last office visit she has gained 2 pounds. She notes she has gotten off track due to celebrations, a funeral and has been eating out more.  She is drinking water , G2, coffee, sodas, juice, protein shake and sometimes a hot tea. She is walking to stay active.  Notes polyphagia.    Pharmacotherapy for weight loss: She is currently taking Contrave  2 po BID for medical weight loss.  Denies side effects.     The authorization is effective from 09/22/2023 to 09/22/2024    Previous pharmacotherapy for medical weight loss:  Metformin    Bariatric surgery:  Patient has not had bariatric surgery.     PHYSICAL EXAM:  Blood pressure 120/81, pulse (!) 56, temperature 98.1 F (36.7 C), height 5' 4 (1.626 m), weight 206 lb (93.4 kg), SpO2 97%. Body mass index is 35.36 kg/m.  General: She is overweight, cooperative, alert, well developed, and in no acute  distress. PSYCH: Has normal mood, affect and thought process.   Extremities: No edema.  Neurologic: No gross sensory or motor deficits. No tremors or fasciculations noted.    DIAGNOSTIC DATA REVIEWED:  BMET    Component Value Date/Time   NA 140 01/06/2024 0807   K 4.1 01/06/2024 0807   CL 100 01/06/2024 0807   CO2 26 01/06/2024 0807   GLUCOSE 91 01/06/2024 0807   GLUCOSE 80 09/27/2021 1458   BUN 14 01/06/2024 0807   CREATININE 0.85 01/06/2024 0807   CALCIUM 9.1 01/06/2024 0807   GFRNONAA >60 06/01/2018 0955   GFRAA >60 06/01/2018 0955   Lab Results  Component Value Date   HGBA1C 5.3 01/06/2024   HGBA1C 5.5 03/02/2018   Lab Results  Component Value Date   INSULIN  20.6 01/06/2024   INSULIN  27.7 (H) 11/18/2022   Lab Results  Component Value Date   TSH 2.800 01/06/2024   CBC    Component Value Date/Time   WBC 6.6 04/10/2023 0823   WBC 9.5 09/27/2021 1458   RBC 4.77 04/10/2023 0823   RBC 4.64 09/27/2021 1458   HGB 14.2 04/10/2023 0823   HCT 42.3 04/10/2023 0823   PLT 262 04/10/2023 0823   MCV 89 04/10/2023 0823   MCH 29.8 04/10/2023 0823   MCH 27.8 06/01/2018 0955   MCHC 33.6 04/10/2023 0823   MCHC 33.6 09/27/2021 1458   RDW 13.2 04/10/2023 0823  Iron Studies No results found for: IRON, TIBC, FERRITIN, IRONPCTSAT Lipid Panel     Component Value Date/Time   CHOL 144 01/06/2024 0807   TRIG 68 01/06/2024 0807   HDL 51 01/06/2024 0807   CHOLHDL 3 09/27/2021 1458   VLDL 15.2 09/27/2021 1458   LDLCALC 79 01/06/2024 0807   Hepatic Function Panel     Component Value Date/Time   PROT 6.9 01/06/2024 0807   ALBUMIN 4.4 01/06/2024 0807   AST 17 01/06/2024 0807   ALT 13 01/06/2024 0807   ALKPHOS 83 01/06/2024 0807   BILITOT 0.8 01/06/2024 0807      Component Value Date/Time   TSH 2.800 01/06/2024 0807   Nutritional Lab Results  Component Value Date   VD25OH 44.9 01/06/2024   VD25OH 76.6 04/10/2023   VD25OH 28.6 (L) 11/18/2022      ASSESSMENT AND PLAN  TREATMENT PLAN FOR OBESITY:  Recommended Dietary Goals  Stephanie Long is currently in the action stage of change. As such, her goal is to continue weight management plan. She has agreed to track and will review macros at next visit-mynetdiary.  Behavioral Intervention  We discussed the following Behavioral Modification Strategies today: increasing lean protein intake to established goals, decreasing simple carbohydrates , increasing vegetables, increasing fiber rich foods, increasing water  intake , work on tracking and journaling calories using tracking application, and continue to work on maintaining a reduced calorie state, getting the recommended amount of protein, incorporating whole foods, making healthy choices, staying well hydrated and practicing mindfulness when eating..  Additional resources provided today: NA  Recommended Physical Activity Goals  Stephanie Long has been advised to work up to 150 minutes of moderate intensity aerobic activity a week and strengthening exercises 2-3 times per week for cardiovascular health, weight loss maintenance and preservation of muscle mass.   She has agreed to Think about enjoyable ways to increase daily physical activity and overcoming barriers to exercise, Increase physical activity in their day and reduce sedentary time (increase NEAT)., Start strengthening exercises with a goal of 2-3 sessions a week , and continue to gradually increase the amount and intensity of exercise routine   Pharmacotherapy We discussed various medication options to help Stephanie Long with her weight loss efforts and we both agreed to stop Contrtave and start Qsymia .  Side effects discussed.  Patient knows not to get pregnant while taking due to side effects of birth defects associated with Topamax .    ASSOCIATED CONDITIONS ADDRESSED TODAY  Action/Plan  Polyphagia -     Phentermine -Topiramate  ER; Take one po daily  Dispense: 30 capsule; Refill:  0  Class 2 severe obesity due to excess calories with serious comorbidity and body mass index (BMI) of 35.0 to 35.9 in adult [facility]) -     Phentermine -Topiramate  ER; Take one po daily  Dispense: 30 capsule; Refill: 0      Return in about 4 weeks (around 06/14/2024).Stephanie Long She was informed of the importance of frequent follow up visits to maximize her success with intensive lifestyle modifications for her multiple health conditions.   ATTESTASTION STATEMENTS:  Reviewed by clinician on day of visit: allergies, medications, problem list, medical history, surgical history, family history, social history, and previous encounter notes.    Corean SAUNDERS. Krystena Reitter FNP-C

## 2024-05-20 ENCOUNTER — Other Ambulatory Visit (HOSPITAL_COMMUNITY): Payer: Self-pay

## 2024-05-26 DIAGNOSIS — F419 Anxiety disorder, unspecified: Secondary | ICD-10-CM | POA: Diagnosis not present

## 2024-06-01 ENCOUNTER — Other Ambulatory Visit: Payer: Self-pay

## 2024-06-03 ENCOUNTER — Other Ambulatory Visit: Payer: Self-pay

## 2024-06-03 NOTE — Progress Notes (Signed)
 Specialty Pharmacy Refill Coordination Note  Stephanie Long is a 48 y.o. female contacted today regarding refills of specialty medication(s) Dupilumab  (Dupixent )   Patient requested Delivery   Delivery date: 06/04/24   Verified address: [address] [place]   Medication will be filled on 06/03/24.

## 2024-06-05 ENCOUNTER — Telehealth (HOSPITAL_COMMUNITY): Payer: Self-pay | Admitting: Pharmacy Technician

## 2024-06-05 ENCOUNTER — Other Ambulatory Visit (HOSPITAL_COMMUNITY): Payer: Self-pay

## 2024-06-07 ENCOUNTER — Other Ambulatory Visit: Payer: Self-pay | Admitting: Nurse Practitioner

## 2024-06-07 ENCOUNTER — Other Ambulatory Visit (HOSPITAL_COMMUNITY): Payer: Self-pay

## 2024-06-07 ENCOUNTER — Telehealth (HOSPITAL_COMMUNITY): Payer: Self-pay

## 2024-06-07 MED ORDER — PHENTERMINE-TOPIRAMATE ER 3.75-23 MG PO CP24
1.0000 | ORAL_CAPSULE | Freq: Every day | ORAL | 0 refills | Status: DC
  Filled 2024-06-07 – 2024-06-16 (×2): qty 14, 14d supply, fill #0

## 2024-06-07 NOTE — Telephone Encounter (Signed)
 PA request has been Received. New Encounter has been or will be created for follow up. For additional info see Pharmacy Prior Auth telephone encounter from 06/07/24.

## 2024-06-07 NOTE — Telephone Encounter (Signed)
 Pharmacy Patient Advocate Encounter   Received notification from Pt Calls Messages that prior authorization for Phentermine -Topiramate  ER 7.5-46MG  er capsules  is required/requested.   Insurance verification completed.   The patient is insured through [facility] .   Per test claim: Typically this medication is not started at the 7.5 mg dose. You usually start with the 3.75 mg dose. Is there a reason why we are starting with the 7.5 mg dose?

## 2024-06-07 NOTE — Telephone Encounter (Signed)
 Pharmacy Patient Advocate Encounter  Received notification from [facility] that Prior Authorization for Phentermine -Topiramate  ER 7.5-46MG  er capsules  has been APPROVED from 06/07/24 to 06/07/25. Ran test claim, Copay is $5. This test claim was processed through [facility]- copay amounts may vary at other pharmacies due to pharmacy/plan contracts, or as the patient moves through the different stages of their insurance plan.   PA #/Case ID/Reference #: [id number]

## 2024-06-08 ENCOUNTER — Other Ambulatory Visit (HOSPITAL_COMMUNITY): Payer: Self-pay

## 2024-06-09 ENCOUNTER — Other Ambulatory Visit (HOSPITAL_BASED_OUTPATIENT_CLINIC_OR_DEPARTMENT_OTHER): Payer: Self-pay

## 2024-06-09 ENCOUNTER — Telehealth (HOSPITAL_COMMUNITY): Payer: Self-pay

## 2024-06-09 ENCOUNTER — Other Ambulatory Visit (HOSPITAL_COMMUNITY): Payer: Self-pay

## 2024-06-09 ENCOUNTER — Encounter (HOSPITAL_COMMUNITY): Payer: Self-pay

## 2024-06-09 ENCOUNTER — Telehealth (HOSPITAL_COMMUNITY): Payer: Self-pay | Admitting: Pharmacy Technician

## 2024-06-09 NOTE — Telephone Encounter (Signed)
 PA request has been Received. New Encounter has been or will be created for follow up. For additional info see Pharmacy Prior Auth telephone encounter from 06/09/24.

## 2024-06-09 NOTE — Telephone Encounter (Signed)
 Pharmacy Patient Advocate Encounter   Received notification from Pt Calls Messages that prior authorization for Phentermine -Topiramate  ER 3.75-23MG  er capsules is required/requested.   Insurance verification completed.   The patient is insured through [facility] .   Per test claim: PA required; PA submitted to above mentioned insurance via Latent Key/confirmation #/EOC [id number] Status is pending

## 2024-06-15 NOTE — Telephone Encounter (Signed)
 Pharmacy Patient Advocate Encounter  Received notification from [facility] that Prior Authorization for Phentermine -Topiramate  ER 3.75-23MG  er capsules  has been APPROVED from 06/11/2024 to 06/25/2024.   PA #/Case ID/Reference #: [id number]

## 2024-06-16 ENCOUNTER — Ambulatory Visit: Admitting: Nurse Practitioner

## 2024-06-16 ENCOUNTER — Encounter: Payer: Self-pay | Admitting: Nurse Practitioner

## 2024-06-16 ENCOUNTER — Other Ambulatory Visit (HOSPITAL_COMMUNITY): Payer: Self-pay

## 2024-06-16 VITALS — BP 121/81 | HR 63 | Temp 98.1°F | Ht 64.0 in | Wt 209.0 lb

## 2024-06-16 DIAGNOSIS — R632 Polyphagia: Secondary | ICD-10-CM

## 2024-06-16 DIAGNOSIS — Z6835 Body mass index (BMI) 35.0-35.9, adult: Secondary | ICD-10-CM | POA: Diagnosis not present

## 2024-06-16 DIAGNOSIS — E66812 Obesity, class 2: Secondary | ICD-10-CM | POA: Diagnosis not present

## 2024-06-16 NOTE — Progress Notes (Signed)
 Office: [phone]  /  Fax: [phone]  WEIGHT SUMMARY AND BIOMETRICS  Weight Lost Since Last Visit: 0lb  Weight Gained Since Last Visit: 3lb   Vitals Temp: 98.1 F (36.7 C) BP: 121/81 Pulse Rate: 63 SpO2: 96 %   Anthropometric Measurements Height: 5' 4 (1.626 m) Weight: 209 lb (94.8 kg) BMI (Calculated): 35.86 Weight at Last Visit: 206lb Weight Lost Since Last Visit: 0lb Weight Gained Since Last Visit: 3lb Starting Weight: 234lb Total Weight Loss (lbs): 25 lb (11.3 kg)   Body Composition  Body Fat %: 43.6 % Fat Mass (lbs): 91.2 lbs Muscle Mass (lbs): 112 lbs Total Body Water  (lbs): 82.8 lbs Visceral Fat Rating : 11   Other Clinical Data Fasting: No Labs: No Today's Visit #: 17 Starting Date: 11/18/22     HPI  Chief Complaint: OBESITY  Stephanie Long is here to discuss her progress with her obesity treatment plan. She is on the the Category 2 Plan and states she is following her eating plan approximately 40 % of the time. She states she is exercising 30 minutes 6 days per week.   Interval History:  Since last office visit she has gained 3 pounds.  She notes it has been a rough month and she has gotten off track. She has been eating out more, going on vacations and has been eating more sweets.  She is drinking water , coffee and occ G2.  She is walking her dog to stay active.  She is here today to get back on track.    Her highest weight was 255 lbs.    Pharmacotherapy for weight loss:  She started taking Qsymia  dose 2 yesterday.       Previous pharmacotherapy for medical weight loss:  Metformin , Contrave    Bariatric surgery:  Patient has not had bariatric surgery.        PHYSICAL EXAM:  Blood pressure 121/81, pulse 63, temperature 98.1 F (36.7 C), height 5' 4 (1.626 m), weight 209 lb (94.8 kg), SpO2 96%. Body mass index is 35.87 kg/m.  General: She is overweight, cooperative, alert, well developed, and in no acute distress. PSYCH: Has  normal mood, affect and thought process.   Extremities: No edema.  Neurologic: No gross sensory or motor deficits. No tremors or fasciculations noted.    DIAGNOSTIC DATA REVIEWED:  BMET    Component Value Date/Time   NA 140 01/06/2024 0807   K 4.1 01/06/2024 0807   CL 100 01/06/2024 0807   CO2 26 01/06/2024 0807   GLUCOSE 91 01/06/2024 0807   GLUCOSE 80 09/27/2021 1458   BUN 14 01/06/2024 0807   CREATININE 0.85 01/06/2024 0807   CALCIUM 9.1 01/06/2024 0807   GFRNONAA >60 06/01/2018 0955   GFRAA >60 06/01/2018 0955   Lab Results  Component Value Date   HGBA1C 5.3 01/06/2024   HGBA1C 5.5 03/02/2018   Lab Results  Component Value Date   INSULIN  20.6 01/06/2024   INSULIN  27.7 (H) 11/18/2022   Lab Results  Component Value Date   TSH 2.800 01/06/2024   CBC    Component Value Date/Time   WBC 6.6 04/10/2023 0823   WBC 9.5 09/27/2021 1458   RBC 4.77 04/10/2023 0823   RBC 4.64 09/27/2021 1458   HGB 14.2 04/10/2023 0823   HCT 42.3 04/10/2023 0823   PLT 262 04/10/2023 0823   MCV 89 04/10/2023 0823   MCH 29.8 04/10/2023 0823   MCH 27.8 06/01/2018 0955   MCHC 33.6 04/10/2023 0823   MCHC 33.6  09/27/2021 1458   RDW 13.2 04/10/2023 0823   Iron Studies No results found for: IRON, TIBC, FERRITIN, IRONPCTSAT Lipid Panel     Component Value Date/Time   CHOL 144 01/06/2024 0807   TRIG 68 01/06/2024 0807   HDL 51 01/06/2024 0807   CHOLHDL 3 09/27/2021 1458   VLDL 15.2 09/27/2021 1458   LDLCALC 79 01/06/2024 0807   Hepatic Function Panel     Component Value Date/Time   PROT 6.9 01/06/2024 0807   ALBUMIN 4.4 01/06/2024 0807   AST 17 01/06/2024 0807   ALT 13 01/06/2024 0807   ALKPHOS 83 01/06/2024 0807   BILITOT 0.8 01/06/2024 0807      Component Value Date/Time   TSH 2.800 01/06/2024 0807   Nutritional Lab Results  Component Value Date   VD25OH 44.9 01/06/2024   VD25OH 76.6 04/10/2023   VD25OH 28.6 (L) 11/18/2022     ASSESSMENT AND  PLAN  TREATMENT PLAN FOR OBESITY:  Recommended Dietary Goals  Stephanie Long is currently in the action stage of change. As such, her goal is to continue weight management plan. She has agreed to practicing portion control and making smarter food choices, such as increasing vegetables and decreasing simple carbohydrates.  Behavioral Intervention  We discussed the following Behavioral Modification Strategies today: increasing lean protein intake to established goals, decreasing simple carbohydrates , increasing vegetables, increasing fiber rich foods, increasing water  intake , work on meal planning and preparation, reading food labels , keeping healthy foods at home, identifying sources and decreasing liquid calories, and continue to work on maintaining a reduced calorie state, getting the recommended amount of protein, incorporating whole foods, making healthy choices, staying well hydrated and practicing mindfulness when eating..  Additional resources provided today: NA  Recommended Physical Activity Goals  Stephanie Long has been advised to work up to 150 minutes of moderate intensity aerobic activity a week and strengthening exercises 2-3 times per week for cardiovascular health, weight loss maintenance and preservation of muscle mass.   She has agreed to Think about enjoyable ways to increase daily physical activity and overcoming barriers to exercise, Increase physical activity in their day and reduce sedentary time (increase NEAT)., and Work on scheduling and tracking physical activity.    Pharmacotherapy We discussed various medication options to help Stephanie Long with her weight loss efforts and we both agreed to continue Qsymia  dose. 2.  Side effects discussed.  ASSOCIATED CONDITIONS ADDRESSED TODAY  Action/Plan  Polyphagia Intensive lifestyle modifications are the first line treatment for this issue. We discussed several lifestyle modifications today and she will continue to work on diet,  exercise and weight loss efforts. Orders and follow up as documented in patient record.  Counseling Polyphagia is excessive hunger. Causes can include: low blood sugars, hypERthyroidism, PMS, lack of sleep, stress, insulin  resistance, diabetes, certain medications, and diets that are deficient in protein and fiber.    Class 2 severe obesity due to excess calories with serious comorbidity and body mass index (BMI) of 35.0 to 35.9 in adult [facility])  Continue Qsymia  dose 2. Side effects discussed.          Return in about 4 weeks (around 07/14/2024).[name] She was informed of the importance of frequent follow up visits to maximize her success with intensive lifestyle modifications for her multiple health conditions.   ATTESTASTION STATEMENTS:  Reviewed by clinician on day of visit: allergies, medications, problem list, medical history, surgical history, family history, social history, and previous encounter notes.   Time spent on visit including  pre-visit chart review and post-visit care and charting was 30 minutes. We discussed extensively today following a meal plan, exercising and meal prepping. She is eating too many carbs, sweets, etc.      Corean R. Von Quintanar FNP-C

## 2024-06-17 ENCOUNTER — Other Ambulatory Visit (HOSPITAL_COMMUNITY): Payer: Self-pay

## 2024-06-18 ENCOUNTER — Other Ambulatory Visit (HOSPITAL_COMMUNITY): Payer: Self-pay

## 2024-06-22 ENCOUNTER — Encounter: Payer: Self-pay | Admitting: Nurse Practitioner

## 2024-06-23 ENCOUNTER — Other Ambulatory Visit (HOSPITAL_COMMUNITY): Payer: Self-pay

## 2024-06-23 DIAGNOSIS — F419 Anxiety disorder, unspecified: Secondary | ICD-10-CM | POA: Diagnosis not present

## 2024-06-24 ENCOUNTER — Other Ambulatory Visit: Payer: Self-pay

## 2024-06-29 ENCOUNTER — Other Ambulatory Visit: Payer: Self-pay

## 2024-06-29 ENCOUNTER — Other Ambulatory Visit (HOSPITAL_COMMUNITY): Payer: Self-pay

## 2024-06-29 NOTE — Progress Notes (Signed)
 Specialty Pharmacy Refill Coordination Note  Spoke with Blessen Kimbrough is a 48 y.o. female contacted today regarding refills of specialty medication(s) Dupilumab  (Dupixent )  Doses on hand: 0  Injection date: 07/09/24   Patient requested: Delivery   Delivery date: 07/02/24   Verified address: [address]  [place] [address]  Medication will be filled on 07/01/24.

## 2024-06-30 ENCOUNTER — Other Ambulatory Visit: Payer: Self-pay

## 2024-06-30 NOTE — Progress Notes (Signed)
 Clinical Intervention Note  Clinical Intervention Notes: Patient reported starting Qsymia . Completed DDI check with Dupixent ; none identified.   Clinical Intervention Outcomes: Prevention of an adverse drug event   Stephanie Long [facility] Specialty Pharmacist

## 2024-07-01 ENCOUNTER — Other Ambulatory Visit: Payer: Self-pay

## 2024-07-14 ENCOUNTER — Ambulatory Visit: Admitting: Nurse Practitioner

## 2024-07-14 ENCOUNTER — Encounter: Payer: Self-pay | Admitting: Nurse Practitioner

## 2024-07-14 ENCOUNTER — Other Ambulatory Visit: Payer: Self-pay

## 2024-07-14 ENCOUNTER — Other Ambulatory Visit (HOSPITAL_COMMUNITY): Payer: Self-pay

## 2024-07-14 VITALS — BP 120/82 | HR 68 | Temp 98.4°F | Ht 64.0 in | Wt 201.0 lb

## 2024-07-14 DIAGNOSIS — R7303 Prediabetes: Secondary | ICD-10-CM

## 2024-07-14 DIAGNOSIS — Z6834 Body mass index (BMI) 34.0-34.9, adult: Secondary | ICD-10-CM | POA: Diagnosis not present

## 2024-07-14 DIAGNOSIS — R632 Polyphagia: Secondary | ICD-10-CM | POA: Diagnosis not present

## 2024-07-14 DIAGNOSIS — E66812 Obesity, class 2: Secondary | ICD-10-CM

## 2024-07-14 MED ORDER — PHENTERMINE-TOPIRAMATE ER 7.5-46 MG PO CP24
ORAL_CAPSULE | ORAL | 0 refills | Status: DC
  Filled 2024-07-14: qty 30, 30d supply, fill #0

## 2024-07-14 MED ORDER — METFORMIN HCL 500 MG PO TABS
500.0000 mg | ORAL_TABLET | Freq: Every day | ORAL | 0 refills | Status: DC
  Filled 2024-07-14: qty 30, 30d supply, fill #0

## 2024-07-14 NOTE — Progress Notes (Signed)
 Office: [phone]  /  Fax: [phone]  WEIGHT SUMMARY AND BIOMETRICS  Weight Lost Since Last Visit: 8lb  Weight Gained Since Last Visit: 0lb   Vitals Temp: 98.4 F (36.9 C) BP: 120/82 Pulse Rate: 68 SpO2: 95 %   Anthropometric Measurements Height: 5' 4 (1.626 m) Weight: 201 lb (91.2 kg) BMI (Calculated): 34.48 Weight at Last Visit: 209lb Weight Lost Since Last Visit: 8lb Weight Gained Since Last Visit: 0lb Starting Weight: 234lb Total Weight Loss (lbs): 33 lb (15 kg)   Body Composition  Body Fat %: 43.4 % Fat Mass (lbs): 87.4 lbs Muscle Mass (lbs): 108.2 lbs Total Body Water  (lbs): 80.6 lbs Visceral Fat Rating : 10   Other Clinical Data Fasting: Yes Labs: No Today's Visit #: 18 Starting Date: 11/18/22     HPI  Chief Complaint: OBESITY  Stephanie Long is here to discuss her progress with her obesity treatment plan. She is on the the Category 2 Plan and states she is following her eating plan approximately 80-85 % of the time. She states she is exercising 30-45 minutes 5 days per week.   Interval History:  Since last office visit she has lost 8 pounds.  She has been making healthier choices, watching her portion sizes, eating more protein and is walking more.  She is drinking water , coffee and occ G2.   Her highest weight was 255 lbs.     Pharmacotherapy for weight loss:  She is taking Qsymia  dose 2. Notes occ tingling off and on in her feet.       Previous pharmacotherapy for medical weight loss:  Metformin , Contrave    Bariatric surgery:  Patient has not had bariatric surgery.     Prediabetes Last A1c was 5.3  Medication(s): Metformin  500mg  BID (notes does forget to take second dose).  Denies side effects.   Polyphagia:Yes Lab Results  Component Value Date   HGBA1C 5.3 01/06/2024   HGBA1C 5.5 04/10/2023   HGBA1C 5.8 (H) 11/18/2022   HGBA1C 5.3 09/27/2021   HGBA1C 5.4 06/14/2020   Lab Results  Component Value Date   INSULIN  20.6  01/06/2024   INSULIN  22.0 04/10/2023   INSULIN  27.7 (H) 11/18/2022    PHYSICAL EXAM:  Blood pressure 120/82, pulse 68, temperature 98.4 F (36.9 C), height 5' 4 (1.626 m), weight 201 lb (91.2 kg), SpO2 95%. Body mass index is 34.5 kg/m.  General: She is overweight, cooperative, alert, well developed, and in no acute distress. PSYCH: Has normal mood, affect and thought process.   Extremities: No edema.  Neurologic: No gross sensory or motor deficits. No tremors or fasciculations noted.    DIAGNOSTIC DATA REVIEWED:  BMET    Component Value Date/Time   NA 140 01/06/2024 0807   K 4.1 01/06/2024 0807   CL 100 01/06/2024 0807   CO2 26 01/06/2024 0807   GLUCOSE 91 01/06/2024 0807   GLUCOSE 80 09/27/2021 1458   BUN 14 01/06/2024 0807   CREATININE 0.85 01/06/2024 0807   CALCIUM 9.1 01/06/2024 0807   GFRNONAA >60 06/01/2018 0955   GFRAA >60 06/01/2018 0955   Lab Results  Component Value Date   HGBA1C 5.3 01/06/2024   HGBA1C 5.5 03/02/2018   Lab Results  Component Value Date   INSULIN  20.6 01/06/2024   INSULIN  27.7 (H) 11/18/2022   Lab Results  Component Value Date   TSH 2.800 01/06/2024   CBC    Component Value Date/Time   WBC 6.6 04/10/2023 0823   WBC 9.5 09/27/2021 1458  RBC 4.77 04/10/2023 0823   RBC 4.64 09/27/2021 1458   HGB 14.2 04/10/2023 0823   HCT 42.3 04/10/2023 0823   PLT 262 04/10/2023 0823   MCV 89 04/10/2023 0823   MCH 29.8 04/10/2023 0823   MCH 27.8 06/01/2018 0955   MCHC 33.6 04/10/2023 0823   MCHC 33.6 09/27/2021 1458   RDW 13.2 04/10/2023 0823   Iron Studies No results found for: IRON, TIBC, FERRITIN, IRONPCTSAT Lipid Panel     Component Value Date/Time   CHOL 144 01/06/2024 0807   TRIG 68 01/06/2024 0807   HDL 51 01/06/2024 0807   CHOLHDL 3 09/27/2021 1458   VLDL 15.2 09/27/2021 1458   LDLCALC 79 01/06/2024 0807   Hepatic Function Panel     Component Value Date/Time   PROT 6.9 01/06/2024 0807   ALBUMIN 4.4 01/06/2024  0807   AST 17 01/06/2024 0807   ALT 13 01/06/2024 0807   ALKPHOS 83 01/06/2024 0807   BILITOT 0.8 01/06/2024 0807      Component Value Date/Time   TSH 2.800 01/06/2024 0807   Nutritional Lab Results  Component Value Date   VD25OH 44.9 01/06/2024   VD25OH 76.6 04/10/2023   VD25OH 28.6 (L) 11/18/2022     ASSESSMENT AND PLAN  TREATMENT PLAN FOR OBESITY:  Recommended Dietary Goals  Soley is currently in the action stage of change. As such, her goal is to continue weight management plan. She has agreed to the Category 2 Plan.  Behavioral Intervention  We discussed the following Behavioral Modification Strategies today: increasing lean protein intake to established goals, decreasing simple carbohydrates , increasing vegetables, increasing fiber rich foods, increasing water  intake , work on meal planning and preparation, reading food labels , keeping healthy foods at home, continue to work on maintaining a reduced calorie state, getting the recommended amount of protein, incorporating whole foods, making healthy choices, staying well hydrated and practicing mindfulness when eating., and increase protein intake, fibrous foods (25 grams per day for women, 30 grams for men) and water  to improve satiety and decrease hunger signals. .  Additional resources provided today: NA  Recommended Physical Activity Goals  Creta has been advised to work up to 150 minutes of moderate intensity aerobic activity a week and strengthening exercises 2-3 times per week for cardiovascular health, weight loss maintenance and preservation of muscle mass.   She has agreed to Think about enjoyable ways to increase daily physical activity and overcoming barriers to exercise, Increase physical activity in their day and reduce sedentary time (increase NEAT)., Continue to gradually increase the amount and intensity of exercise routine, Increase volume of physical activity to a goal of 240 minutes a week, and  Combine aerobic and strengthening exercises for efficiency and improved cardiometabolic health.   Pharmacotherapy We discussed various medication options to help Latoia with her weight loss efforts and we both agreed to continue Qsymia  dose 2.  Side effects discussed.  ASSOCIATED CONDITIONS ADDRESSED TODAY  Action/Plan  Prediabetes -     Change to metFORMIN  HCl; Take 1 tablet (500 mg total) by mouth daily with breakfast.  Dispense: 30 tablet; Refill: 0. Side effects discussed.   Polyphagia -     Continue Phentermine -Topiramate  ER; Take one capsule by mouth daily  Dispense: 30 capsule; Refill: 0.  Side effects discussed.   Class 2 severe obesity due to excess calories with serious comorbidity and body mass index (BMI) of 35.0 to 35.9 in adult [facility]) -     Phentermine -Topiramate  ER; Take one  capsule by mouth daily  Dispense: 30 capsule; Refill: 0       Will obtain at next visit.    Return in about 4 weeks (around 08/11/2024).[name] She was informed of the importance of frequent follow up visits to maximize her success with intensive lifestyle modifications for her multiple health conditions.   ATTESTASTION STATEMENTS:  Reviewed by clinician on day of visit: allergies, medications, problem list, medical history, surgical history, family history, social history, and previous encounter notes.    Corean SAUNDERS. Minh Roanhorse FNP-C

## 2024-07-15 ENCOUNTER — Other Ambulatory Visit (HOSPITAL_COMMUNITY): Payer: Self-pay

## 2024-07-16 ENCOUNTER — Other Ambulatory Visit (HOSPITAL_COMMUNITY): Payer: Self-pay

## 2024-07-21 ENCOUNTER — Other Ambulatory Visit: Payer: Self-pay

## 2024-07-22 ENCOUNTER — Other Ambulatory Visit: Payer: Self-pay

## 2024-07-24 ENCOUNTER — Other Ambulatory Visit: Payer: Self-pay | Admitting: Medical Genetics

## 2024-07-24 DIAGNOSIS — Z006 Encounter for examination for normal comparison and control in clinical research program: Secondary | ICD-10-CM

## 2024-07-26 ENCOUNTER — Encounter (INDEPENDENT_AMBULATORY_CARE_PROVIDER_SITE_OTHER): Payer: Self-pay

## 2024-07-26 ENCOUNTER — Other Ambulatory Visit: Payer: Self-pay

## 2024-07-26 NOTE — Addendum Note (Signed)
 Addended by: REMUS NOEMI PARAS on: 07/26/2024 11:50 AM   Modules accepted: Orders

## 2024-07-27 ENCOUNTER — Other Ambulatory Visit: Payer: Self-pay | Admitting: Pharmacy Technician

## 2024-07-27 ENCOUNTER — Other Ambulatory Visit: Payer: Self-pay

## 2024-07-27 NOTE — Progress Notes (Signed)
 Specialty Pharmacy Refill Coordination Note  Stephanie Long is a 48 y.o. female contacted today regarding refills of specialty medication(s) Dupilumab  (Dupixent )   Patient requested (Patient-Rptd) Delivery   Delivery date: Once MD approve.  Verified address: (Patient-Rptd) [address]   Medication will be filled on Once MD approve.   This fill date is pending response to refill request from provider. Patient is aware and if they have not received fill by intended date they must follow up with pharmacy.

## 2024-07-28 ENCOUNTER — Other Ambulatory Visit (HOSPITAL_COMMUNITY): Payer: Self-pay

## 2024-08-02 DIAGNOSIS — L301 Dyshidrosis [pompholyx]: Secondary | ICD-10-CM | POA: Diagnosis not present

## 2024-08-04 ENCOUNTER — Other Ambulatory Visit: Payer: Self-pay

## 2024-08-04 MED ORDER — DUPIXENT 300 MG/2ML ~~LOC~~ SOAJ: SUBCUTANEOUS | 10 refills | Status: DC

## 2024-08-14 LAB — GENECONNECT MOLECULAR SCREEN: Genetic Analysis Overall Interpretation: NEGATIVE

## 2024-08-16 ENCOUNTER — Ambulatory Visit: Admitting: Nurse Practitioner

## 2024-08-16 ENCOUNTER — Other Ambulatory Visit (HOSPITAL_COMMUNITY): Payer: Self-pay

## 2024-08-16 ENCOUNTER — Encounter: Payer: Self-pay | Admitting: Nurse Practitioner

## 2024-08-16 ENCOUNTER — Other Ambulatory Visit: Payer: Self-pay

## 2024-08-16 VITALS — BP 116/80 | HR 65 | Temp 98.2°F | Ht 64.0 in | Wt 199.0 lb

## 2024-08-16 DIAGNOSIS — E66812 Obesity, class 2: Secondary | ICD-10-CM

## 2024-08-16 DIAGNOSIS — R632 Polyphagia: Secondary | ICD-10-CM | POA: Diagnosis not present

## 2024-08-16 DIAGNOSIS — R7303 Prediabetes: Secondary | ICD-10-CM | POA: Diagnosis not present

## 2024-08-16 DIAGNOSIS — Z6835 Body mass index (BMI) 35.0-35.9, adult: Secondary | ICD-10-CM | POA: Diagnosis not present

## 2024-08-16 MED ORDER — PHENTERMINE-TOPIRAMATE ER 7.5-46 MG PO CP24
ORAL_CAPSULE | ORAL | 0 refills | Status: DC
Start: 2024-08-16 — End: 2024-09-22
  Filled 2024-08-16: qty 30, 30d supply, fill #0

## 2024-08-16 MED ORDER — METFORMIN HCL 500 MG PO TABS
500.0000 mg | ORAL_TABLET | Freq: Every day | ORAL | 0 refills | Status: AC
  Filled 2024-08-16: qty 90, 90d supply, fill #0

## 2024-08-16 NOTE — Progress Notes (Signed)
 Office: [phone]  /  Fax: [phone]  WEIGHT SUMMARY AND BIOMETRICS  Weight Lost Since Last Visit: 2lb  Weight Gained Since Last Visit: 0lb   Vitals Temp: 98.2 F (36.8 C) BP: 116/80 Pulse Rate: 65 SpO2: 95 %   Anthropometric Measurements Height: 5' 4 (1.626 m) Weight: 199 lb (90.3 kg) BMI (Calculated): 34.14 Weight at Last Visit: 201lb Weight Lost Since Last Visit: 2lb Weight Gained Since Last Visit: 0lb Starting Weight: 234lb Total Weight Loss (lbs): 35 lb (15.9 kg)   Body Composition  Body Fat %: 42.2 % Fat Mass (lbs): 84.2 lbs Muscle Mass (lbs): 109.4 lbs Total Body Water  (lbs): 78 lbs Visceral Fat Rating : 10   Other Clinical Data Fasting: No Labs: No Today's Visit #: 19 Starting Date: 11/18/22     HPI  Chief Complaint: OBESITY  Stephanie Long is here to discuss her progress with her obesity treatment plan. She is on the the Category 2 Plan and states she is following her eating plan approximately 70 % of the time. She states she is exercising 4-5 minutes 30-45 days per week.   Interval History:  Since last office visit she has lost 2 pounds.   She is aiming to meet her protein goals and water  goals. Denies polyphagia and cravings.  She is walking 4-5 days per week.   The last time she weighed < 200 lbs was 15 years ago.    Her highest weight was 255 lbs.    Goal weight 165 lbs.    Pharmacotherapy for weight loss:  She is taking Qsymia  dose 2. Notes occ tingling off and on in her feet.       Previous pharmacotherapy for medical weight loss:  Metformin  and Contrave    Bariatric surgery:  Patient has not had bariatric surgery.  Prediabetes Last A1c was 5.3  Medication(s): Metformin  500mg  daily. Denies side effects.   Polyphagia:denies   Lab Results  Component Value Date   HGBA1C 5.3 01/06/2024   HGBA1C 5.5 04/10/2023   HGBA1C 5.8 (H) 11/18/2022   HGBA1C 5.3 09/27/2021   HGBA1C 5.4 06/14/2020   Lab Results  Component Value Date    INSULIN  20.6 01/06/2024   INSULIN  22.0 04/10/2023   INSULIN  27.7 (H) 11/18/2022      PHYSICAL EXAM:  Blood pressure 116/80, pulse 65, temperature 98.2 F (36.8 C), height 5' 4 (1.626 m), weight 199 lb (90.3 kg), SpO2 95%. Body mass index is 34.16 kg/m.  General: She is overweight, cooperative, alert, well developed, and in no acute distress. PSYCH: Has normal mood, affect and thought process.   Extremities: No edema.  Neurologic: No gross sensory or motor deficits. No tremors or fasciculations noted.    DIAGNOSTIC DATA REVIEWED:  BMET    Component Value Date/Time   NA 140 01/06/2024 0807   K 4.1 01/06/2024 0807   CL 100 01/06/2024 0807   CO2 26 01/06/2024 0807   GLUCOSE 91 01/06/2024 0807   GLUCOSE 80 09/27/2021 1458   BUN 14 01/06/2024 0807   CREATININE 0.85 01/06/2024 0807   CALCIUM 9.1 01/06/2024 0807   GFRNONAA >60 06/01/2018 0955   GFRAA >60 06/01/2018 0955   Lab Results  Component Value Date   HGBA1C 5.3 01/06/2024   HGBA1C 5.5 03/02/2018   Lab Results  Component Value Date   INSULIN  20.6 01/06/2024   INSULIN  27.7 (H) 11/18/2022   Lab Results  Component Value Date   TSH 2.800 01/06/2024   CBC    Component Value Date/Time  WBC 6.6 04/10/2023 0823   WBC 9.5 09/27/2021 1458   RBC 4.77 04/10/2023 0823   RBC 4.64 09/27/2021 1458   HGB 14.2 04/10/2023 0823   HCT 42.3 04/10/2023 0823   PLT 262 04/10/2023 0823   MCV 89 04/10/2023 0823   MCH 29.8 04/10/2023 0823   MCH 27.8 06/01/2018 0955   MCHC 33.6 04/10/2023 0823   MCHC 33.6 09/27/2021 1458   RDW 13.2 04/10/2023 0823   Iron Studies No results found for: IRON, TIBC, FERRITIN, IRONPCTSAT Lipid Panel     Component Value Date/Time   CHOL 144 01/06/2024 0807   TRIG 68 01/06/2024 0807   HDL 51 01/06/2024 0807   CHOLHDL 3 09/27/2021 1458   VLDL 15.2 09/27/2021 1458   LDLCALC 79 01/06/2024 0807   Hepatic Function Panel     Component Value Date/Time   PROT 6.9 01/06/2024 0807    ALBUMIN 4.4 01/06/2024 0807   AST 17 01/06/2024 0807   ALT 13 01/06/2024 0807   ALKPHOS 83 01/06/2024 0807   BILITOT 0.8 01/06/2024 0807      Component Value Date/Time   TSH 2.800 01/06/2024 0807   Nutritional Lab Results  Component Value Date   VD25OH 44.9 01/06/2024   VD25OH 76.6 04/10/2023   VD25OH 28.6 (L) 11/18/2022     ASSESSMENT AND PLAN  TREATMENT PLAN FOR OBESITY:  Recommended Dietary Goals  Tamlyn is currently in the action stage of change. As such, her goal is to continue weight management plan. She has agreed to the Category 2 Plan.  Behavioral Intervention  We discussed the following Behavioral Modification Strategies today: increasing lean protein intake to established goals, decreasing simple carbohydrates , increasing vegetables, increasing fiber rich foods, increasing water  intake , work on meal planning and preparation, reading food labels , keeping healthy foods at home, continue to work on maintaining a reduced calorie state, getting the recommended amount of protein, incorporating whole foods, making healthy choices, staying well hydrated and practicing mindfulness when eating., and increase protein intake, fibrous foods (25 grams per day for women, 30 grams for men) and water  to improve satiety and decrease hunger signals. .  Additional resources provided today: NA  Recommended Physical Activity Goals  Jamal has been advised to work up to 150 minutes of moderate intensity aerobic activity a week and strengthening exercises 2-3 times per week for cardiovascular health, weight loss maintenance and preservation of muscle mass.   She has agreed to Think about enjoyable ways to increase daily physical activity and overcoming barriers to exercise, Increase physical activity in their day and reduce sedentary time (increase NEAT)., Continue to gradually increase the amount and intensity of exercise routine, Increase volume of physical activity to a goal of 240  minutes a week, and Combine aerobic and strengthening exercises for efficiency and improved cardiometabolic health.   Pharmacotherapy We discussed various medication options to help Vicci with her weight loss efforts and we both agreed to continue Qsymia  dose 2.  Side effects discussed.  ASSOCIATED CONDITIONS ADDRESSED TODAY  Action/Plan  Prediabetes -     metFORMIN  HCl; Take 1 tablet (500 mg total) by mouth daily with breakfast.  Dispense: 90 tablet; Refill: 0  Polyphagia -     Phentermine -Topiramate  ER; Take one capsule by mouth daily  Dispense: 30 capsule; Refill: 0  Class 2 severe obesity due to excess calories with serious comorbidity and body mass index (BMI) of 35.0 to 35.9 in adult -     Phentermine -Topiramate  ER; Take one capsule  by mouth daily  Dispense: 30 capsule; Refill: 0     Will check CMP at next visit.     Return in about 4 weeks (around 09/13/2024).[name] She was informed of the importance of frequent follow up visits to maximize her success with intensive lifestyle modifications for her multiple health conditions.   ATTESTASTION STATEMENTS:  Reviewed by clinician on day of visit: allergies, medications, problem list, medical history, surgical history, family history, social history, and previous encounter notes.     Corean SAUNDERS. Taliya Mcclard FNP-C

## 2024-08-17 ENCOUNTER — Ambulatory Visit: Attending: Family Medicine | Admitting: Pharmacist

## 2024-08-17 ENCOUNTER — Other Ambulatory Visit: Payer: Self-pay

## 2024-08-17 DIAGNOSIS — Z7189 Other specified counseling: Secondary | ICD-10-CM

## 2024-08-17 MED ORDER — DUPIXENT 300 MG/2ML ~~LOC~~ SOAJ
SUBCUTANEOUS | 10 refills | Status: AC
  Filled 2024-08-17: qty 4, 28d supply, fill #0
  Filled 2024-09-07 – 2024-10-07 (×2): qty 4, 28d supply, fill #1

## 2024-08-17 NOTE — Progress Notes (Signed)
   S: Patient presents for review of their specialty medication therapy.  Patient is about to start taking Dupixent  for atopic dermatitis. Patient is managed by Dr. Dietrich for this.   Adherence: confirms  Efficacy: confirms   Dosing: 300 mg subq q2weeks   Dose adjustments: Renal: no dose adjustments (has not been studied) Hepatic: no dose adjustments (has not been studied)  Drug-drug interactions: none  Monitoring: S/sx of infection: none S/sx of hypersensitivity: none S/sx of ocular effects: none S/sx of eosinophilia/vasculitis: none  O:     Lab Results  Component Value Date   WBC 6.6 04/10/2023   HGB 14.2 04/10/2023   HCT 42.3 04/10/2023   MCV 89 04/10/2023   PLT 262 04/10/2023      Chemistry      Component Value Date/Time   NA 140 01/06/2024 0807   K 4.1 01/06/2024 0807   CL 100 01/06/2024 0807   CO2 26 01/06/2024 0807   BUN 14 01/06/2024 0807   CREATININE 0.85 01/06/2024 0807      Component Value Date/Time   CALCIUM 9.1 01/06/2024 0807   ALKPHOS 83 01/06/2024 0807   AST 17 01/06/2024 0807   ALT 13 01/06/2024 0807   BILITOT 0.8 01/06/2024 0807       A/P: 1. Medication review: Patient is about to start on Dupixent  for atopic dermatitis. Reviewed the medication with the patient, including the following: Dupixent  is a monoclonal antibody used for the treatment of asthma or atopic dermatitis. Patient educated on purpose, proper use and potential adverse effects of Dupixent . Possible adverse effects include increased risk of infection, ocular effects, vasculitis/eosinophilia, and hypersensitivity reactions. Administer as a SubQ injection and rotate sites. Allow the medication to reach room temp prior to administration (45 mins for 300 mg syringe or 30 min for 200 mg syringe). Do not shake. Discard any unused portion. No recommendations for any changes.  Stephanie Long, PharmD, [name], CPP Clinical Pharmacist [facility] [phone]

## 2024-09-06 ENCOUNTER — Other Ambulatory Visit: Payer: Self-pay | Admitting: Family Medicine

## 2024-09-06 ENCOUNTER — Other Ambulatory Visit: Payer: Self-pay | Admitting: Nurse Practitioner

## 2024-09-06 DIAGNOSIS — I10 Essential (primary) hypertension: Secondary | ICD-10-CM

## 2024-09-06 DIAGNOSIS — R632 Polyphagia: Secondary | ICD-10-CM

## 2024-09-06 DIAGNOSIS — Z6835 Body mass index (BMI) 35.0-35.9, adult: Secondary | ICD-10-CM

## 2024-09-07 ENCOUNTER — Other Ambulatory Visit: Payer: Self-pay

## 2024-09-07 ENCOUNTER — Other Ambulatory Visit (HOSPITAL_COMMUNITY): Payer: Self-pay

## 2024-09-07 MED ORDER — AMLODIPINE BESYLATE 2.5 MG PO TABS
2.5000 mg | ORAL_TABLET | Freq: Every day | ORAL | 3 refills | Status: AC
  Filled 2024-09-07 – 2024-09-15 (×2): qty 90, 90d supply, fill #0

## 2024-09-07 MED ORDER — HYDROCHLOROTHIAZIDE 25 MG PO TABS
25.0000 mg | ORAL_TABLET | Freq: Every day | ORAL | 3 refills | Status: AC
  Filled 2024-09-07 – 2024-09-15 (×2): qty 90, 90d supply, fill #0

## 2024-09-09 ENCOUNTER — Other Ambulatory Visit: Payer: Self-pay

## 2024-09-15 ENCOUNTER — Other Ambulatory Visit (HOSPITAL_COMMUNITY): Payer: Self-pay

## 2024-09-15 ENCOUNTER — Other Ambulatory Visit: Payer: Self-pay

## 2024-09-22 ENCOUNTER — Ambulatory Visit: Admitting: Nurse Practitioner

## 2024-09-22 ENCOUNTER — Encounter: Payer: Self-pay | Admitting: Nurse Practitioner

## 2024-09-22 ENCOUNTER — Other Ambulatory Visit: Payer: Self-pay

## 2024-09-22 ENCOUNTER — Other Ambulatory Visit (HOSPITAL_COMMUNITY): Payer: Self-pay

## 2024-09-22 VITALS — BP 118/80 | HR 63 | Temp 98.0°F | Ht 64.0 in | Wt 198.0 lb

## 2024-09-22 DIAGNOSIS — E66812 Obesity, class 2: Secondary | ICD-10-CM | POA: Diagnosis not present

## 2024-09-22 DIAGNOSIS — Z6835 Body mass index (BMI) 35.0-35.9, adult: Secondary | ICD-10-CM

## 2024-09-22 DIAGNOSIS — R632 Polyphagia: Secondary | ICD-10-CM | POA: Diagnosis not present

## 2024-09-22 MED ORDER — PHENTERMINE-TOPIRAMATE ER 7.5-46 MG PO CP24
1.0000 | ORAL_CAPSULE | ORAL | 0 refills | Status: DC
  Filled 2024-09-22 – 2024-10-06 (×2): qty 30, 30d supply, fill #0

## 2024-09-22 NOTE — Progress Notes (Signed)
 Office: [phone]  /  Fax: [phone]  WEIGHT SUMMARY AND BIOMETRICS  Weight Lost Since Last Visit: 1lb  Weight Gained Since Last Visit: 0lb   Vitals Temp: 98 F (36.7 C) BP: 118/80 Pulse Rate: 63 SpO2: 97 %   Anthropometric Measurements Height: 5' 4 (1.626 m) Weight: 198 lb (89.8 kg) BMI (Calculated): 33.97 Weight at Last Visit: 199lb Weight Lost Since Last Visit: 1lb Weight Gained Since Last Visit: 0lb Starting Weight: 234lb Total Weight Loss (lbs): 36 lb (16.3 kg)   Body Composition  Body Fat %: 42.6 % Fat Mass (lbs): 84.6 lbs Muscle Mass (lbs): 108.4 lbs Total Body Water  (lbs): 79.2 lbs Visceral Fat Rating : 10   Other Clinical Data Fasting: No Labs: No Today's Visit #: 20 Starting Date: 11/18/22     HPI  Chief Complaint: OBESITY  Aretta is here to discuss her progress with her obesity treatment plan. She is on the the Category 2 Plan and states she is following her eating plan approximately 50 % of the time. She states she is exercising 0 minutes 0 days per week.   Interval History:  Since last office visit she has lost 1 pound.  She has been traveling, had a couple celebrations and has been eating out more.  She is drinking water  but not as much as I am supposed to be. She is also drinking seltzer water , coffee and fairlife skim milk.  She is not currently exercising.  Her goal is start exercising. Notes occ polyphagia and cravings.    The last time she weighed < 200 lbs was 15 years ago.     Her highest weight was 255 lbs.    Pharmacotherapy for weight loss:  She is taking Qsymia  dose 2. Denies side effects.     Previous pharmacotherapy for medical weight loss:  Metformin  and Contrave    Bariatric surgery:  Patient has not had bariatric surgery.  PHYSICAL EXAM:  Blood pressure 118/80, pulse 63, temperature 98 F (36.7 C), height 5' 4 (1.626 m), weight 198 lb (89.8 kg), SpO2 97%. Body mass index is 33.99 kg/m.  General: She  is overweight, cooperative, alert, well developed, and in no acute distress. PSYCH: Has normal mood, affect and thought process.   Extremities: No edema.  Neurologic: No gross sensory or motor deficits. No tremors or fasciculations noted.    DIAGNOSTIC DATA REVIEWED:  BMET    Component Value Date/Time   NA 140 01/06/2024 0807   K 4.1 01/06/2024 0807   CL 100 01/06/2024 0807   CO2 26 01/06/2024 0807   GLUCOSE 91 01/06/2024 0807   GLUCOSE 80 09/27/2021 1458   BUN 14 01/06/2024 0807   CREATININE 0.85 01/06/2024 0807   CALCIUM 9.1 01/06/2024 0807   GFRNONAA >60 06/01/2018 0955   GFRAA >60 06/01/2018 0955   Lab Results  Component Value Date   HGBA1C 5.3 01/06/2024   HGBA1C 5.5 03/02/2018   Lab Results  Component Value Date   INSULIN  20.6 01/06/2024   INSULIN  27.7 (H) 11/18/2022   Lab Results  Component Value Date   TSH 2.800 01/06/2024   CBC    Component Value Date/Time   WBC 6.6 04/10/2023 0823   WBC 9.5 09/27/2021 1458   RBC 4.77 04/10/2023 0823   RBC 4.64 09/27/2021 1458   HGB 14.2 04/10/2023 0823   HCT 42.3 04/10/2023 0823   PLT 262 04/10/2023 0823   MCV 89 04/10/2023 0823   MCH 29.8 04/10/2023 0823   MCH 27.8 06/01/2018  0955   MCHC 33.6 04/10/2023 0823   MCHC 33.6 09/27/2021 1458   RDW 13.2 04/10/2023 0823   Iron Studies No results found for: IRON, TIBC, FERRITIN, IRONPCTSAT Lipid Panel     Component Value Date/Time   CHOL 144 01/06/2024 0807   TRIG 68 01/06/2024 0807   HDL 51 01/06/2024 0807   CHOLHDL 3 09/27/2021 1458   VLDL 15.2 09/27/2021 1458   LDLCALC 79 01/06/2024 0807   Hepatic Function Panel     Component Value Date/Time   PROT 6.9 01/06/2024 0807   ALBUMIN 4.4 01/06/2024 0807   AST 17 01/06/2024 0807   ALT 13 01/06/2024 0807   ALKPHOS 83 01/06/2024 0807   BILITOT 0.8 01/06/2024 0807      Component Value Date/Time   TSH 2.800 01/06/2024 0807   Nutritional Lab Results  Component Value Date   VD25OH 44.9 01/06/2024    VD25OH 76.6 04/10/2023   VD25OH 28.6 (L) 11/18/2022     ASSESSMENT AND PLAN  TREATMENT PLAN FOR OBESITY:  Recommended Dietary Goals  Jemiah is currently in the action stage of change. As such, her goal is to continue weight management plan. She has agreed to practicing portion control and making smarter food choices, such as increasing vegetables and decreasing simple carbohydrates.  Behavioral Intervention  We discussed the following Behavioral Modification Strategies today: increasing lean protein intake to established goals, decreasing simple carbohydrates , increasing vegetables, increasing fiber rich foods, increasing water  intake , reading food labels , keeping healthy foods at home, celebration eating strategies, continue to work on maintaining a reduced calorie state, getting the recommended amount of protein, incorporating whole foods, making healthy choices, staying well hydrated and practicing mindfulness when eating., and increase protein intake, fibrous foods (25 grams per day for women, 30 grams for men) and water  to improve satiety and decrease hunger signals. .  Additional resources provided today: NA  Recommended Physical Activity Goals  Dahiana has been advised to work up to 150 minutes of moderate intensity aerobic activity a week and strengthening exercises 2-3 times per week for cardiovascular health, weight loss maintenance and preservation of muscle mass.   She has agreed to Think about enjoyable ways to increase daily physical activity and overcoming barriers to exercise, Increase physical activity in their day and reduce sedentary time (increase NEAT)., Work on scheduling and tracking physical activity. , Increase volume of physical activity to a goal of 240 minutes a week, and Combine aerobic and strengthening exercises for efficiency and improved cardiometabolic health.   Pharmacotherapy We discussed various medication options to help Melana with her weight  loss efforts and we both agreed to continue Qsymia  dose 2. Side effects discussed.  ASSOCIATED CONDITIONS ADDRESSED TODAY  Action/Plan  Polyphagia -     Phentermine -Topiramate  ER; Take one capsule by mouth daily  Dispense: 30 capsule; Refill: 0  Class 2 severe obesity due to excess calories with serious comorbidity and body mass index (BMI) of 35.0 to 35.9 in adult -     Phentermine -Topiramate  ER; Take one capsule by mouth daily  Dispense: 30 capsule; Refill: 0      Will recheck labs at next visit. Our lab is closed today.    Return in about 4 weeks (around 10/20/2024).[name] She was informed of the importance of frequent follow up visits to maximize her success with intensive lifestyle modifications for her multiple health conditions.   ATTESTASTION STATEMENTS:  Reviewed by clinician on day of visit: allergies, medications, problem list, medical history, surgical  history, family history, social history, and previous encounter notes.     Corean SAUNDERS. Rivkah Wolz FNP-C

## 2024-09-30 ENCOUNTER — Telehealth (HOSPITAL_COMMUNITY): Payer: Self-pay

## 2024-09-30 ENCOUNTER — Other Ambulatory Visit (HOSPITAL_COMMUNITY): Payer: Self-pay

## 2024-10-01 ENCOUNTER — Other Ambulatory Visit (HOSPITAL_BASED_OUTPATIENT_CLINIC_OR_DEPARTMENT_OTHER): Payer: Self-pay

## 2024-10-01 ENCOUNTER — Telehealth (HOSPITAL_COMMUNITY): Payer: Self-pay

## 2024-10-01 NOTE — Telephone Encounter (Signed)
 PA request has been Received. New Encounter has been or will be created for follow up. For additional info see Pharmacy Prior Auth telephone encounter from 10/01/24.

## 2024-10-01 NOTE — Telephone Encounter (Signed)
 Pharmacy Patient Advocate Encounter   Received notification from Pt Calls Messages that prior authorization for Phentermine -Topiramate  ER 7.5-46MG  er capsules  is required/requested.   Insurance verification completed.   The patient is insured through [facility].   Per test claim: PA required; PA submitted to above mentioned insurance via Latent Key/confirmation #/EOC [id number] Status is pending

## 2024-10-04 ENCOUNTER — Ambulatory Visit (INDEPENDENT_AMBULATORY_CARE_PROVIDER_SITE_OTHER)

## 2024-10-04 ENCOUNTER — Ambulatory Visit (HOSPITAL_BASED_OUTPATIENT_CLINIC_OR_DEPARTMENT_OTHER): Admitting: Student

## 2024-10-04 DIAGNOSIS — M25512 Pain in left shoulder: Secondary | ICD-10-CM | POA: Diagnosis not present

## 2024-10-04 MED ORDER — LIDOCAINE HCL 1 % IJ SOLN
4.0000 mL | INTRAMUSCULAR | Status: AC | PRN
  Administered 2024-10-04: 4 mL

## 2024-10-04 MED ORDER — TRIAMCINOLONE ACETONIDE 40 MG/ML IJ SUSP
2.0000 mL | INTRAMUSCULAR | Status: AC | PRN
  Administered 2024-10-04: 2 mL via INTRA_ARTICULAR

## 2024-10-04 NOTE — Progress Notes (Signed)
 Chief Complaint: Left shoulder pain     History of Present Illness:    Stephanie Long is a pleasant 48 y.o. right-hand-dominant female who presents today with acute left shoulder pain.  Patient reports that 1 week ago she was reaching behind her back when she noticed pain although denies any injury or pop.  She continues to experience mild but persistent discomfort particular in the front of the shoulder with a throbbing sensation.  She feels that her shoulder range of motion is limited.  She has tried Tylenol , ibuprofen , Salonpas patches, icing, and a tramadol last night for sleep.  Denies any significant neck pain but does have some muscle tension in the posterior shoulder.   Surgical History:   None  PMH/PSH/Family History/Social History/Meds/Allergies:    Past Medical History:  Diagnosis Date   Allergy    Anxiety    Asthma    Back pain    Depression    GERD (gastroesophageal reflux disease)    Gestational diabetes    Gestational hypertension 05/09/2013   Hx of varicella    Joint pain    Osteoarthritis    PCOS (polycystic ovarian syndrome) 2012   Plantar fasciitis    Symptomatic cholelithiasis    Past Surgical History:  Procedure Laterality Date   CESAREAN SECTION N/A 05/09/2013   Procedure: primary cesarean section with delivery of baby girl at 1800.  Apgars 9/9.;  Surgeon: Charlie JINNY Flowers, MD;  Location: [facility];  Service: Obstetrics;  Laterality: N/A;   CHOLECYSTECTOMY N/A 06/08/2018   Procedure: LAPAROSCOPIC CHOLECYSTECTOMY;  Surgeon: Sebastian Moles, MD;  Location: [facility];  Service: General;  Laterality: N/A;   KNEE ARTHROSCOPY Right 10/11/2021   Procedure: RIGHT KNEE ARTHROSCOPY WITH PARTIAL MEDIAL MENISCECTOMY;  Surgeon: Vernetta Lonni GRADE, MD;  Location: [facility];  Service: Orthopedics;  Laterality: Right;   WISDOM TOOTH EXTRACTION     WISDOM TOOTH EXTRACTION     Social History   Socioeconomic  History   Marital status: Married    Spouse name: Not on file   Number of children: 1   Years of education: BSN   Highest education level: Not on file  Occupational History   Not on file  Tobacco Use   Smoking status: Never   Smokeless tobacco: Never  Vaping Use   Vaping status: Never Used  Substance and Sexual Activity   Alcohol use: Yes    Alcohol/week: 2.0 standard drinks of alcohol    Types: 1 Glasses of wine, 1 Standard drinks or equivalent per week   Drug use: No   Sexual activity: Yes    Partners: Male  Other Topics Concern   Not on file  Social History Narrative   Drinks 2 caffeine drinks a day    Social Drivers of Corporate Investment Banker Strain: Not on file  Food Insecurity: Not on file  Transportation Needs: Not on file  Physical Activity: Not on file  Stress: Not on file  Social Connections: Not on file   Family History  Problem Relation Age of Onset   Depression Mother    Hypertension Mother    Diabetes Father    Heart disease Father    Hyperlipidemia Father    Heart attack Father    Hypertension Father    Stroke Maternal Grandmother  Cancer Neg Hx    Asthma Neg Hx    Early death Neg Hx    Colon cancer Neg Hx    Colon polyps Neg Hx    Esophageal cancer Neg Hx    Rectal cancer Neg Hx    Stomach cancer Neg Hx    No Known Allergies Current Outpatient Medications  Medication Sig Dispense Refill   Acetaminophen  (TYLENOL  PO) Take 1 tablet by mouth as needed.     amLODipine  (NORVASC ) 2.5 MG tablet Take 1 tablet (2.5 mg total) by mouth daily. 90 tablet 3   DUPIXENT  300 MG/2ML SOAJ inject one pen every two weeks 4 mL 10   fexofenadine (ALLEGRA) 60 MG tablet Take 60 mg by mouth 2 (two) times daily.     fluticasone  (FLOVENT  HFA) 110 MCG/ACT inhaler Inhale 1 puff into the lungs in the morning and at bedtime. 12 g 12   hydrochlorothiazide  (HYDRODIURIL ) 25 MG tablet Take 1 tablet (25 mg total) by mouth daily. 90 tablet 3   hydrOXYzine  (VISTARIL ) 25 MG  capsule Take 1 capsule (25 mg total) by mouth every 8 (eight) hours as needed for anxiety. 30 capsule 0   IBUPROFEN  PO Take 1 tablet by mouth as needed.     MAGNESIUM CITRATE PO Take by mouth.     Melatonin 5 MG TABS Take by mouth. (Patient not taking: Reported on 09/22/2024)     metFORMIN  (GLUCOPHAGE ) 500 MG tablet Take 1 tablet (500 mg total) by mouth daily with breakfast. 90 tablet 0   Multiple Vitamin (MULTIVITAMIN) tablet Take 1 tablet by mouth daily.     Phentermine -Topiramate  ER 7.5-46 MG CP24 Take one capsule by mouth daily 30 capsule 0   VENTOLIN  HFA 108 (90 Base) MCG/ACT inhaler INHALE 2 PUFFS INTO THE LUNGS EVERY 6 HOURS AS NEEDED FOR WHEEZING OR SHORTNESS OF BREATH. (Patient not taking: Reported on 09/22/2024) 18 g 6   VITAMIN D  PO Take by mouth. (Patient not taking: Reported on 09/22/2024)     No current facility-administered medications for this visit.   No results found.  Review of Systems:   A ROS was performed including pertinent positives and negatives as documented in the HPI.  Physical Exam :   Constitutional: NAD and appears stated age Neurological: Alert and oriented Psych: Appropriate affect and cooperative There were no vitals taken for this visit.   Comprehensive Musculoskeletal Exam:    Exam of the left shoulder demonstrates tenderness with palpation over the anterior glenohumeral joint.  No AC or lateral deltoid tenderness.  Active range of motion to 140 degrees flexion, 60 degrees external rotation, and internal rotation to L1 compared to 160, 70, L1 on contralateral side.  Passive flexion to 160 degrees.  Negative Hawkins and pain with empty can.  Full ROM of the cervical spine.  Imaging:   Xray (left shoulder 3 views): Mild degenerative changes of the acromioclavicular joint but otherwise negative for bony abnormality   I personally reviewed and interpreted the radiographs.   Assessment:   48 y.o. female with 1 week history of atraumatic left shoulder  pain.  Discussed that based on her history and exam this appears most consistent with early adhesive capsulitis.  She does have some slight deficits in ROM due to pain although full passive ROM is intact.  No history of shoulder surgery, trauma, diabetes, or thyroid  disease.  Discussed pathology and general progression as well as different treatment options.  I have recommended therapeutic exercises for range of motion and offered  a glenohumeral left shoulder injection which patient has agreed to proceed with.  Injection was performed using ultrasound which she tolerated very well.  Recommend another follow-up in 2 weeks particularly if any symptoms have persisted or worsened for reevaluation.  Plan :    - Left shoulder glenohumeral injection performed today - Follow-up in 2 weeks if symptoms persist    Procedure Note  Patient: Stephanie Long             Date of Birth: 11/17/1975           MRN: [id number]             Visit Date: 10/04/2024  Procedures: Visit Diagnoses:  1. Acute pain of left shoulder     Large Joint Inj: L glenohumeral on 10/04/2024 9:55 AM Indications: pain Details: 22 G 1.5 in needle, anterior approach Medications: 4 mL lidocaine  1 %; 2 mL triamcinolone  acetonide 40 MG/ML Outcome: tolerated well, no immediate complications Procedure, treatment alternatives, risks and benefits explained, specific risks discussed. Consent was given by the patient. Immediately prior to procedure a time out was called to verify the correct patient, procedure, equipment, support staff and site/side marked as required. Patient was prepped and draped in the usual sterile fashion.       I personally saw and evaluated the patient, and participated in the management and treatment plan.  Leonce Reveal, PA-C Orthopedics

## 2024-10-05 ENCOUNTER — Encounter: Payer: Self-pay | Admitting: Nurse Practitioner

## 2024-10-05 ENCOUNTER — Encounter (HOSPITAL_COMMUNITY): Payer: Self-pay

## 2024-10-06 ENCOUNTER — Other Ambulatory Visit (HOSPITAL_COMMUNITY): Payer: Self-pay

## 2024-10-06 NOTE — Telephone Encounter (Signed)
 Pharmacy Patient Advocate Encounter  Received notification from [facility] that Prior Authorization for Phentermine -Topiramate  ER 7.5-46MG  er capsules  has been DENIED.  See denial reason below. No denial letter attached in CMM. Will attach denial letter to Media tab once received.   PA #/Case ID/Reference #: [id number]

## 2024-10-07 ENCOUNTER — Other Ambulatory Visit: Payer: Self-pay

## 2024-10-07 ENCOUNTER — Other Ambulatory Visit (HOSPITAL_COMMUNITY): Payer: Self-pay

## 2024-10-08 ENCOUNTER — Other Ambulatory Visit (HOSPITAL_COMMUNITY): Payer: Self-pay

## 2024-10-11 ENCOUNTER — Other Ambulatory Visit: Payer: Self-pay | Admitting: Nurse Practitioner

## 2024-10-11 ENCOUNTER — Other Ambulatory Visit (HOSPITAL_COMMUNITY): Payer: Self-pay

## 2024-10-11 ENCOUNTER — Telehealth: Payer: Self-pay | Admitting: Nurse Practitioner

## 2024-10-11 DIAGNOSIS — E66812 Obesity, class 2: Secondary | ICD-10-CM

## 2024-10-11 DIAGNOSIS — R632 Polyphagia: Secondary | ICD-10-CM

## 2024-10-11 MED ORDER — PHENTERMINE-TOPIRAMATE ER 7.5-46 MG PO CP24: 1.0000 | ORAL_CAPSULE | Freq: Every day | ORAL | 0 refills | Status: DC

## 2024-10-11 NOTE — Telephone Encounter (Signed)
 The insurance denied medication

## 2024-10-11 NOTE — Telephone Encounter (Signed)
 Patient is following up on why her medication was denied (Phentermine ) Please give her at call at [phone]. Thank you!

## 2024-10-12 ENCOUNTER — Other Ambulatory Visit: Payer: Self-pay

## 2024-10-14 ENCOUNTER — Other Ambulatory Visit: Payer: Self-pay

## 2024-10-19 ENCOUNTER — Encounter: Payer: Self-pay | Admitting: Nurse Practitioner

## 2024-10-19 ENCOUNTER — Ambulatory Visit: Admitting: Nurse Practitioner

## 2024-10-19 VITALS — BP 123/81 | HR 63 | Temp 98.3°F | Ht 64.0 in | Wt 196.0 lb

## 2024-10-19 DIAGNOSIS — E66811 Obesity, class 1: Secondary | ICD-10-CM | POA: Diagnosis not present

## 2024-10-19 DIAGNOSIS — Z6833 Body mass index (BMI) 33.0-33.9, adult: Secondary | ICD-10-CM | POA: Diagnosis not present

## 2024-10-19 DIAGNOSIS — R632 Polyphagia: Secondary | ICD-10-CM

## 2024-10-19 NOTE — Addendum Note (Signed)
 Addended by: Mariana Goytia on: 10/19/2024 01:25 PM   Modules accepted: Level of Service

## 2024-10-19 NOTE — Progress Notes (Signed)
 "  Office: [phone]  /  Fax: [phone]  WEIGHT SUMMARY AND BIOMETRICS  Weight Lost Since Last Visit: 2lb  Weight Gained Since Last Visit: 0lb   Vitals Temp: 98.3 F (36.8 C) BP: 123/81 Pulse Rate: 63 SpO2: 96 %   Anthropometric Measurements Height: 5' 4 (1.626 m) Weight: 196 lb (88.9 kg) BMI (Calculated): 33.63 Weight at Last Visit: 198lb Weight Lost Since Last Visit: 2lb Weight Gained Since Last Visit: 0lb Starting Weight: 234lb Total Weight Loss (lbs): 38 lb (17.2 kg)   Body Composition  Body Fat %: 41.7 % Fat Mass (lbs): 81.8 lbs Muscle Mass (lbs): 108.4 lbs Total Body Water  (lbs): 76.8 lbs Visceral Fat Rating : 10   Other Clinical Data Fasting: No Labs: No Today's Visit #: 21 Starting Date: 11/18/22     HPI  Chief Complaint: OBESITY  Stephanie Long is here to discuss her progress with her obesity treatment plan. She is on the the Category 2 Plan and states she is following her eating plan approximately 60-70 % of the time. She states she is exercising varying minutes 2-3 days per week.   Interval History:  Since last office visit she has lost 2 pounds.  She is doing well with her meal plan.  She is trying to be mindful of what she is eating when eating out. They have been eating out more.   She is trying to make healthier choices.  She is drinking water , coffee and sparkling water .   She is walking her dog and started going to the gym last week.  She is planning to start more resistance training.    The last time she weighed < 200 lbs was 15 years ago.     Her highest weight was 255 lbs.      Pharmacotherapy for weight loss:  She is taking Qsymia  dose 2. Denies side effects. Has helped with polyphagia and cravings.     She started Qsymia  on 05/17/24  She has lost 5% of her body weight. since starting Qsymia .  Qsymia  APPROVED from 06/07/24 to 06/07/25    Previous pharmacotherapy for medical weight loss:  Metformin  and Contrave  (stopped Contrave    on 05/17/24)   Bariatric surgery:  Patient has not had bariatric surgery.  PHYSICAL EXAM:  Blood pressure 123/81, pulse 63, temperature 98.3 F (36.8 C), height 5' 4 (1.626 m), weight 196 lb (88.9 kg), SpO2 96%. Body mass index is 33.64 kg/m.  General: She is overweight, cooperative, alert, well developed, and in no acute distress. PSYCH: Has normal mood, affect and thought process.   Extremities: No edema.  Neurologic: No gross sensory or motor deficits. No tremors or fasciculations noted.    DIAGNOSTIC DATA REVIEWED:  BMET    Component Value Date/Time   NA 140 01/06/2024 0807   K 4.1 01/06/2024 0807   CL 100 01/06/2024 0807   CO2 26 01/06/2024 0807   GLUCOSE 91 01/06/2024 0807   GLUCOSE 80 09/27/2021 1458   BUN 14 01/06/2024 0807   CREATININE 0.85 01/06/2024 0807   CALCIUM 9.1 01/06/2024 0807   GFRNONAA >60 06/01/2018 0955   GFRAA >60 06/01/2018 0955   Lab Results  Component Value Date   HGBA1C 5.3 01/06/2024   HGBA1C 5.5 03/02/2018   Lab Results  Component Value Date   INSULIN  20.6 01/06/2024   INSULIN  27.7 (H) 11/18/2022   Lab Results  Component Value Date   TSH 2.800 01/06/2024   CBC    Component Value Date/Time   WBC 6.6  04/10/2023 0823   WBC 9.5 09/27/2021 1458   RBC 4.77 04/10/2023 0823   RBC 4.64 09/27/2021 1458   HGB 14.2 04/10/2023 0823   HCT 42.3 04/10/2023 0823   PLT 262 04/10/2023 0823   MCV 89 04/10/2023 0823   MCH 29.8 04/10/2023 0823   MCH 27.8 06/01/2018 0955   MCHC 33.6 04/10/2023 0823   MCHC 33.6 09/27/2021 1458   RDW 13.2 04/10/2023 0823   Iron Studies No results found for: IRON, TIBC, FERRITIN, IRONPCTSAT Lipid Panel     Component Value Date/Time   CHOL 144 01/06/2024 0807   TRIG 68 01/06/2024 0807   HDL 51 01/06/2024 0807   CHOLHDL 3 09/27/2021 1458   VLDL 15.2 09/27/2021 1458   LDLCALC 79 01/06/2024 0807   Hepatic Function Panel     Component Value Date/Time   PROT 6.9 01/06/2024 0807   ALBUMIN 4.4  01/06/2024 0807   AST 17 01/06/2024 0807   ALT 13 01/06/2024 0807   ALKPHOS 83 01/06/2024 0807   BILITOT 0.8 01/06/2024 0807      Component Value Date/Time   TSH 2.800 01/06/2024 0807   Nutritional Lab Results  Component Value Date   VD25OH 44.9 01/06/2024   VD25OH 76.6 04/10/2023   VD25OH 28.6 (L) 11/18/2022     ASSESSMENT AND PLAN  TREATMENT PLAN FOR OBESITY:  Recommended Dietary Goals  Stephanie Long is currently in the action stage of change. As such, her goal is to continue weight management plan. She has agreed to keeping a food journal and adhering to recommended goals of 1200-1300 calories and 75+ grams of protein.  Behavioral Intervention  We discussed the following Behavioral Modification Strategies today: increasing lean protein intake to established goals, decreasing simple carbohydrates , increasing vegetables, increasing fiber rich foods, increasing water  intake , work on meal planning and preparation, work on tracking and journaling calories using tracking application, reading food labels , keeping healthy foods at home, planning for success, celebration eating strategies, continue to work on maintaining a reduced calorie state, getting the recommended amount of protein, incorporating whole foods, making healthy choices, staying well hydrated and practicing mindfulness when eating., and increase protein intake, fibrous foods (25 grams per day for women, 30 grams for men) and water  to improve satiety and decrease hunger signals. .  Additional resources provided today: NA  Recommended Physical Activity Goals  Stephanie Long has been advised to work up to 150 minutes of moderate intensity aerobic activity a week and strengthening exercises 2-3 times per week for cardiovascular health, weight loss maintenance and preservation of muscle mass.   She has agreed to Think about enjoyable ways to increase daily physical activity and overcoming barriers to exercise, Increase physical  activity in their day and reduce sedentary time (increase NEAT)., Start strengthening exercises with a goal of 2-3 sessions a week , Work on scheduling and tracking physical activity. , Continue to gradually increase the amount and intensity of exercise routine, Increase volume of physical activity to a goal of 240 minutes a week, and Combine aerobic and strengthening exercises for efficiency and improved cardiometabolic health.   Pharmacotherapy We discussed various medication options to help Stephanie Long with her weight loss efforts and we both agreed to Qsymia  dose 2.  Side effects discussed.  ASSOCIATED CONDITIONS ADDRESSED TODAY  Action/Plan  Polyphagia Continue Qsymia  dose 2.  Side effects discussed.    Obesity, Class I, BMI 30-34.9  Discussed Qsymia  coverage with the pharmacy.  Approval noted and documented in chart.  [employer] reviewed with patient.   Return in about 4 weeks (around 11/16/2024).Stephanie Long She was informed of the importance of frequent follow up visits to maximize her success with intensive lifestyle modifications for her multiple health conditions.   ATTESTASTION STATEMENTS:  Reviewed by clinician on day of visit: allergies, medications, problem list, medical history, surgical history, family history, social history, and previous encounter notes.   I personally spent a total of 30 minutes in the care of the patient today including preparing to see the patient, getting/reviewing separately obtained history, performing a medically appropriate exam/evaluation, counseling and educating, documenting clinical information in the EHR, and coordinating care.    Stephanie Long. Jasleen Riepe FNP-C "

## 2024-11-08 ENCOUNTER — Other Ambulatory Visit (HOSPITAL_COMMUNITY): Payer: Self-pay

## 2024-11-08 ENCOUNTER — Other Ambulatory Visit: Payer: Self-pay | Admitting: Nurse Practitioner

## 2024-11-08 ENCOUNTER — Other Ambulatory Visit: Payer: Self-pay

## 2024-11-08 DIAGNOSIS — E66812 Obesity, class 2: Secondary | ICD-10-CM

## 2024-11-08 DIAGNOSIS — R632 Polyphagia: Secondary | ICD-10-CM

## 2024-11-10 ENCOUNTER — Other Ambulatory Visit: Payer: Self-pay

## 2024-11-10 ENCOUNTER — Other Ambulatory Visit: Payer: Self-pay | Admitting: Nurse Practitioner

## 2024-11-10 ENCOUNTER — Other Ambulatory Visit (HOSPITAL_COMMUNITY): Payer: Self-pay

## 2024-11-10 DIAGNOSIS — R632 Polyphagia: Secondary | ICD-10-CM

## 2024-11-10 DIAGNOSIS — Z6835 Body mass index (BMI) 35.0-35.9, adult: Secondary | ICD-10-CM

## 2024-11-10 MED ORDER — PHENTERMINE-TOPIRAMATE ER 7.5-46 MG PO CP24
1.0000 | ORAL_CAPSULE | Freq: Every day | ORAL | 0 refills | Status: DC
  Filled 2024-11-10 – 2024-11-12 (×3): qty 30, 30d supply, fill #0

## 2024-11-11 ENCOUNTER — Other Ambulatory Visit (HOSPITAL_COMMUNITY): Payer: Self-pay

## 2024-11-11 ENCOUNTER — Other Ambulatory Visit: Payer: Self-pay

## 2024-11-12 ENCOUNTER — Other Ambulatory Visit: Payer: Self-pay

## 2024-11-12 ENCOUNTER — Other Ambulatory Visit (HOSPITAL_COMMUNITY): Payer: Self-pay

## 2024-11-13 ENCOUNTER — Other Ambulatory Visit (HOSPITAL_BASED_OUTPATIENT_CLINIC_OR_DEPARTMENT_OTHER): Payer: Self-pay

## 2024-11-22 ENCOUNTER — Other Ambulatory Visit: Payer: Self-pay

## 2024-11-25 ENCOUNTER — Ambulatory Visit: Admitting: Nurse Practitioner

## 2024-11-25 ENCOUNTER — Encounter: Payer: Self-pay | Admitting: Nurse Practitioner

## 2024-11-25 VITALS — BP 130/84 | HR 74 | Temp 97.9°F | Ht 64.0 in | Wt 199.0 lb

## 2024-11-25 DIAGNOSIS — R632 Polyphagia: Secondary | ICD-10-CM

## 2024-11-25 DIAGNOSIS — Z6834 Body mass index (BMI) 34.0-34.9, adult: Secondary | ICD-10-CM

## 2024-11-25 DIAGNOSIS — E66811 Obesity, class 1: Secondary | ICD-10-CM | POA: Diagnosis not present

## 2024-11-25 MED ORDER — PHENTERMINE-TOPIRAMATE ER 7.5-46 MG PO CP24: 1.0000 | ORAL_CAPSULE | Freq: Every day | ORAL | 1 refills | Status: AC

## 2024-11-25 NOTE — Progress Notes (Signed)
 "  Office: [phone]  /  Fax: [phone]  WEIGHT SUMMARY AND BIOMETRICS  Weight Lost Since Last Visit: 0lb  Weight Gained Since Last Visit: 3lb   Vitals Temp: 97.9 F (36.6 C) BP: 130/84 Pulse Rate: 74 SpO2: 96 %   Anthropometric Measurements Height: 5' 4 (1.626 m) Weight: 199 lb (90.3 kg) BMI (Calculated): 34.14 Weight at Last Visit: 196lb Weight Lost Since Last Visit: 0lb Weight Gained Since Last Visit: 3lb Starting Weight: 234lb Total Weight Loss (lbs): 35 lb (15.9 kg)   Body Composition  Body Fat %: 44.7 % Fat Mass (lbs): 89 lbs Muscle Mass (lbs): 104.4 lbs Total Body Water  (lbs): 83.2 lbs Visceral Fat Rating : 11   Other Clinical Data Fasting: No Labs: No Today's Visit #: 22 Starting Date: 11/18/22     HPI  Chief Complaint: OBESITY  Stephanie Long is here to discuss her progress with her obesity treatment plan. She is on the the Category 2 Plan and states she is following her eating plan approximately 65 % of the time. She states she is exercising 45-60 minutes 1-2 days per week.   Interval History:  Since last office visit she has gained 3 pounds.  She has gotten off track due to the holidays and weather.  She is exercising 1-2 days per week.    The last time she weighed < 200 lbs was over 15 years ago.     Her highest weight was 255 lbs.      Pharmacotherapy for weight loss:  She is taking Qsymia  dose 2. Denies side effects. Denies chest pain, SHOB or palpitations.  Has helped with polyphagia and cravings.      She started Qsymia  on 05/17/24   Phentermine -Topiramate  7.5mg -46mg  has been approved from 11/08/24-11/07/25.    Previous pharmacotherapy for medical weight loss:  Metformin  and Contrave  (stopped Contrave   on 05/17/24)   Bariatric surgery:  Patient has not had bariatric surgery.   PHYSICAL EXAM:  Blood pressure 130/84, pulse 74, temperature 97.9 F (36.6 C), height 5' 4 (1.626 m), weight 199 lb (90.3 kg), SpO2 96%. Body mass index  is 34.16 kg/m.  General: She is overweight, cooperative, alert, well developed, and in no acute distress. PSYCH: Has normal mood, affect and thought process.   Extremities: No edema.  Neurologic: No gross sensory or motor deficits. No tremors or fasciculations noted.    DIAGNOSTIC DATA REVIEWED:  BMET    Component Value Date/Time   NA 140 01/06/2024 0807   K 4.1 01/06/2024 0807   CL 100 01/06/2024 0807   CO2 26 01/06/2024 0807   GLUCOSE 91 01/06/2024 0807   GLUCOSE 80 09/27/2021 1458   BUN 14 01/06/2024 0807   CREATININE 0.85 01/06/2024 0807   CALCIUM 9.1 01/06/2024 0807   GFRNONAA >60 06/01/2018 0955   GFRAA >60 06/01/2018 0955   Lab Results  Component Value Date   HGBA1C 5.3 01/06/2024   HGBA1C 5.5 03/02/2018   Lab Results  Component Value Date   INSULIN  20.6 01/06/2024   INSULIN  27.7 (H) 11/18/2022   Lab Results  Component Value Date   TSH 2.800 01/06/2024   CBC    Component Value Date/Time   WBC 6.6 04/10/2023 0823   WBC 9.5 09/27/2021 1458   RBC 4.77 04/10/2023 0823   RBC 4.64 09/27/2021 1458   HGB 14.2 04/10/2023 0823   HCT 42.3 04/10/2023 0823   PLT 262 04/10/2023 0823   MCV 89 04/10/2023 0823   MCH 29.8 04/10/2023 9176  MCH 27.8 06/01/2018 0955   MCHC 33.6 04/10/2023 0823   MCHC 33.6 09/27/2021 1458   RDW 13.2 04/10/2023 0823   Iron Studies No results found for: IRON, TIBC, FERRITIN, IRONPCTSAT Lipid Panel     Component Value Date/Time   CHOL 144 01/06/2024 0807   TRIG 68 01/06/2024 0807   HDL 51 01/06/2024 0807   CHOLHDL 3 09/27/2021 1458   VLDL 15.2 09/27/2021 1458   LDLCALC 79 01/06/2024 0807   Hepatic Function Panel     Component Value Date/Time   PROT 6.9 01/06/2024 0807   ALBUMIN 4.4 01/06/2024 0807   AST 17 01/06/2024 0807   ALT 13 01/06/2024 0807   ALKPHOS 83 01/06/2024 0807   BILITOT 0.8 01/06/2024 0807      Component Value Date/Time   TSH 2.800 01/06/2024 0807   Nutritional Lab Results  Component Value Date    VD25OH 44.9 01/06/2024   VD25OH 76.6 04/10/2023   VD25OH 28.6 (L) 11/18/2022     ASSESSMENT AND PLAN  TREATMENT PLAN FOR OBESITY:  Recommended Dietary Goals  Stephanie Long is currently in the action stage of change. As such, her goal is to continue weight management plan. She has agreed to practicing portion control and making smarter food choices, such as increasing vegetables and decreasing simple carbohydrates.  Behavioral Intervention  We discussed the following Behavioral Modification Strategies today: increasing lean protein intake to established goals, decreasing simple carbohydrates , increasing vegetables, increasing fiber rich foods, increasing water  intake , work on meal planning and preparation, work on tracking and journaling calories using tracking application, reading food labels , keeping healthy foods at home, planning for success, continue to work on maintaining a reduced calorie state, getting the recommended amount of protein, incorporating whole foods, making healthy choices, staying well hydrated and practicing mindfulness when eating., and increase protein intake, fibrous foods (25 grams per day for women, 30 grams for men) and water  to improve satiety and decrease hunger signals. .  Additional resources provided today: NA  Recommended Physical Activity Goals  Stephanie Long has been advised to work up to 150 minutes of moderate intensity aerobic activity a week and strengthening exercises 2-3 times per week for cardiovascular health, weight loss maintenance and preservation of muscle mass.   She has agreed to Think about enjoyable ways to increase daily physical activity and overcoming barriers to exercise, Increase physical activity in their day and reduce sedentary time (increase NEAT)., Work on scheduling and tracking physical activity. , Continue to gradually increase the amount and intensity of exercise routine, Increase volume of physical activity to a goal of 240  minutes a week, and Combine aerobic and strengthening exercises for efficiency and improved cardiometabolic health.   Pharmacotherapy We discussed various medication options to help Stephanie Long with her weight loss efforts and we both agreed to continue Qsymia  dose 2.  Side effects discussed.  ASSOCIATED CONDITIONS ADDRESSED TODAY  Action/Plan  Polyphagia -     Phentermine -Topiramate  ER; Take 1 capsule by mouth daily.  Dispense: 30 capsule; Refill: 1  Obesity, Class I, BMI 30-34.9 -     Phentermine -Topiramate  ER; Take 1 capsule by mouth daily.  Dispense: 30 capsule; Refill: 1      Will obtain labs in March.     Return in about 4 weeks (around 12/23/2024).[name] She was informed of the importance of frequent follow up visits to maximize her success with intensive lifestyle modifications for her multiple health conditions.   ATTESTASTION STATEMENTS:  Reviewed by clinician on day of  visit: allergies, medications, problem list, medical history, surgical history, family history, social history, and previous encounter notes.    Corean SAUNDERS. Brysun Eschmann FNP-C "

## 2024-11-26 ENCOUNTER — Other Ambulatory Visit (HOSPITAL_COMMUNITY): Payer: Self-pay

## 2024-11-26 MED ORDER — PHENTERMINE-TOPIRAMATE ER 7.5-46 MG PO CP24
1.0000 | ORAL_CAPSULE | Freq: Every day | ORAL | 1 refills | Status: AC
  Filled 2024-11-26: qty 30, 30d supply, fill #0

## 2024-11-27 ENCOUNTER — Other Ambulatory Visit (HOSPITAL_COMMUNITY): Payer: Self-pay

## 2025-01-03 ENCOUNTER — Ambulatory Visit: Admitting: Nurse Practitioner
